# Patient Record
Sex: Female | Born: 1935 | Race: White | Hispanic: No | Marital: Single | Smoking: Former smoker
Health system: Southern US, Community
[De-identification: ages and names within clinical notes are randomized; demographics above are authoritative.]

## PROBLEM LIST (undated history)

## (undated) DIAGNOSIS — J189 Pneumonia, unspecified organism: Secondary | ICD-10-CM

## (undated) DIAGNOSIS — Z923 Personal history of irradiation: Secondary | ICD-10-CM

## (undated) DIAGNOSIS — B029 Zoster without complications: Secondary | ICD-10-CM

## (undated) DIAGNOSIS — K219 Gastro-esophageal reflux disease without esophagitis: Secondary | ICD-10-CM

## (undated) DIAGNOSIS — J449 Chronic obstructive pulmonary disease, unspecified: Secondary | ICD-10-CM

## (undated) DIAGNOSIS — M1990 Unspecified osteoarthritis, unspecified site: Secondary | ICD-10-CM

## (undated) DIAGNOSIS — R0600 Dyspnea, unspecified: Secondary | ICD-10-CM

## (undated) HISTORY — DX: Zoster without complications: B02.9

## (undated) HISTORY — PX: TONSILLECTOMY: SUR1361

## (undated) HISTORY — DX: Personal history of irradiation: Z92.3

## (undated) HISTORY — PX: EYE SURGERY: SHX253

## (undated) HISTORY — PX: DILATION AND CURETTAGE OF UTERUS: SHX78

---

## 2019-02-07 ENCOUNTER — Other Ambulatory Visit: Payer: Self-pay

## 2019-02-07 DIAGNOSIS — Z20822 Contact with and (suspected) exposure to covid-19: Secondary | ICD-10-CM

## 2019-02-08 LAB — NOVEL CORONAVIRUS, NAA: SARS-CoV-2, NAA: NOT DETECTED

## 2019-02-12 ENCOUNTER — Telehealth: Payer: Self-pay | Admitting: General Practice

## 2019-02-12 NOTE — Telephone Encounter (Signed)
Patient informed of negative covid-19 result. Patient verbalized understanding.  °

## 2020-12-01 DIAGNOSIS — C3490 Malignant neoplasm of unspecified part of unspecified bronchus or lung: Secondary | ICD-10-CM

## 2020-12-01 HISTORY — DX: Malignant neoplasm of unspecified part of unspecified bronchus or lung: C34.90

## 2020-12-18 ENCOUNTER — Encounter (HOSPITAL_BASED_OUTPATIENT_CLINIC_OR_DEPARTMENT_OTHER): Payer: Self-pay | Admitting: Emergency Medicine

## 2020-12-18 ENCOUNTER — Inpatient Hospital Stay (HOSPITAL_BASED_OUTPATIENT_CLINIC_OR_DEPARTMENT_OTHER)
Admission: EM | Admit: 2020-12-18 | Discharge: 2020-12-21 | DRG: 189 | Disposition: A | Discharge status: Home / Self Care | Payer: Medicare Other | Attending: Family Medicine | Admitting: Family Medicine

## 2020-12-18 ENCOUNTER — Emergency Department (HOSPITAL_BASED_OUTPATIENT_CLINIC_OR_DEPARTMENT_OTHER): Payer: Medicare Other

## 2020-12-18 ENCOUNTER — Inpatient Hospital Stay (HOSPITAL_COMMUNITY): Payer: Medicare Other

## 2020-12-18 ENCOUNTER — Emergency Department (HOSPITAL_BASED_OUTPATIENT_CLINIC_OR_DEPARTMENT_OTHER): Payer: Medicare Other | Admitting: Radiology

## 2020-12-18 ENCOUNTER — Other Ambulatory Visit: Payer: Self-pay

## 2020-12-18 DIAGNOSIS — Z20822 Contact with and (suspected) exposure to covid-19: Secondary | ICD-10-CM | POA: Diagnosis present

## 2020-12-18 DIAGNOSIS — J90 Pleural effusion, not elsewhere classified: Secondary | ICD-10-CM | POA: Diagnosis present

## 2020-12-18 DIAGNOSIS — J439 Emphysema, unspecified: Secondary | ICD-10-CM

## 2020-12-18 DIAGNOSIS — J9601 Acute respiratory failure with hypoxia: Secondary | ICD-10-CM | POA: Diagnosis not present

## 2020-12-18 DIAGNOSIS — J470 Bronchiectasis with acute lower respiratory infection: Secondary | ICD-10-CM | POA: Diagnosis present

## 2020-12-18 DIAGNOSIS — I248 Other forms of acute ischemic heart disease: Secondary | ICD-10-CM | POA: Diagnosis present

## 2020-12-18 DIAGNOSIS — D7589 Other specified diseases of blood and blood-forming organs: Secondary | ICD-10-CM | POA: Diagnosis present

## 2020-12-18 DIAGNOSIS — F17210 Nicotine dependence, cigarettes, uncomplicated: Secondary | ICD-10-CM | POA: Diagnosis present

## 2020-12-18 DIAGNOSIS — I5032 Chronic diastolic (congestive) heart failure: Secondary | ICD-10-CM | POA: Diagnosis present

## 2020-12-18 DIAGNOSIS — J9811 Atelectasis: Secondary | ICD-10-CM | POA: Diagnosis present

## 2020-12-18 DIAGNOSIS — I700 Atherosclerosis of aorta: Secondary | ICD-10-CM | POA: Diagnosis not present

## 2020-12-18 DIAGNOSIS — J189 Pneumonia, unspecified organism: Secondary | ICD-10-CM | POA: Diagnosis present

## 2020-12-18 DIAGNOSIS — Z880 Allergy status to penicillin: Secondary | ICD-10-CM | POA: Diagnosis not present

## 2020-12-18 DIAGNOSIS — Z720 Tobacco use: Secondary | ICD-10-CM

## 2020-12-18 DIAGNOSIS — R946 Abnormal results of thyroid function studies: Secondary | ICD-10-CM | POA: Diagnosis present

## 2020-12-18 DIAGNOSIS — J9621 Acute and chronic respiratory failure with hypoxia: Secondary | ICD-10-CM | POA: Diagnosis present

## 2020-12-18 DIAGNOSIS — J9622 Acute and chronic respiratory failure with hypercapnia: Secondary | ICD-10-CM | POA: Diagnosis present

## 2020-12-18 DIAGNOSIS — R911 Solitary pulmonary nodule: Secondary | ICD-10-CM | POA: Diagnosis present

## 2020-12-18 DIAGNOSIS — R778 Other specified abnormalities of plasma proteins: Secondary | ICD-10-CM

## 2020-12-18 DIAGNOSIS — D696 Thrombocytopenia, unspecified: Secondary | ICD-10-CM | POA: Diagnosis present

## 2020-12-18 DIAGNOSIS — A419 Sepsis, unspecified organism: Secondary | ICD-10-CM | POA: Insufficient documentation

## 2020-12-18 DIAGNOSIS — R918 Other nonspecific abnormal finding of lung field: Secondary | ICD-10-CM

## 2020-12-18 DIAGNOSIS — R0902 Hypoxemia: Secondary | ICD-10-CM

## 2020-12-18 DIAGNOSIS — J9602 Acute respiratory failure with hypercapnia: Secondary | ICD-10-CM

## 2020-12-18 DIAGNOSIS — R0602 Shortness of breath: Secondary | ICD-10-CM | POA: Diagnosis not present

## 2020-12-18 DIAGNOSIS — D751 Secondary polycythemia: Secondary | ICD-10-CM | POA: Diagnosis not present

## 2020-12-18 DIAGNOSIS — R7989 Other specified abnormal findings of blood chemistry: Secondary | ICD-10-CM | POA: Diagnosis not present

## 2020-12-18 DIAGNOSIS — Z9889 Other specified postprocedural states: Secondary | ICD-10-CM

## 2020-12-18 HISTORY — DX: Sepsis, unspecified organism: A41.9

## 2020-12-18 LAB — BLOOD GAS, VENOUS
Acid-Base Excess: 4.4 mmol/L — ABNORMAL HIGH (ref 0.0–2.0)
Bicarbonate: 37.4 mmol/L — ABNORMAL HIGH (ref 20.0–28.0)
O2 Saturation: 76 %
Patient temperature: 98.6
pCO2, Ven: 88.5 mmHg (ref 44.0–60.0)
pH, Ven: 7.249 — ABNORMAL LOW (ref 7.250–7.430)
pO2, Ven: 49.5 mmHg — ABNORMAL HIGH (ref 32.0–45.0)

## 2020-12-18 LAB — RESP PANEL BY RT-PCR (FLU A&B, COVID) ARPGX2
Influenza A by PCR: NEGATIVE
Influenza B by PCR: NEGATIVE
SARS Coronavirus 2 by RT PCR: NEGATIVE

## 2020-12-18 LAB — TROPONIN I (HIGH SENSITIVITY)
Troponin I (High Sensitivity): 52 ng/L — ABNORMAL HIGH (ref <18)
Troponin I (High Sensitivity): 23 ng/L — ABNORMAL HIGH (ref <18)
Troponin I (High Sensitivity): 47 ng/L — ABNORMAL HIGH (ref <18)

## 2020-12-18 LAB — COMPREHENSIVE METABOLIC PANEL
ALT: 19 U/L (ref 0–44)
AST: 26 U/L (ref 15–41)
Albumin: 3.8 g/dL (ref 3.5–5.0)
Alkaline Phosphatase: 100 U/L (ref 38–126)
Anion gap: 8 (ref 5–15)
BUN: 12 mg/dL (ref 8–23)
CO2: 37 mmol/L — ABNORMAL HIGH (ref 22–32)
Calcium: 9 mg/dL (ref 8.9–10.3)
Chloride: 97 mmol/L — ABNORMAL LOW (ref 98–111)
Creatinine, Ser: 0.7 mg/dL (ref 0.44–1.00)
GFR, Estimated: >60 mL/min (ref >60)
Glucose, Bld: 80 mg/dL (ref 70–99)
Potassium: 4.8 mmol/L (ref 3.5–5.1)
Sodium: 142 mmol/L (ref 135–145)
Total Bilirubin: 1.2 mg/dL (ref 0.3–1.2)
Total Protein: 6.6 g/dL (ref 6.5–8.1)

## 2020-12-18 LAB — CBC WITH DIFFERENTIAL/PLATELET
Abs Immature Granulocytes: 0 10*3/uL (ref 0.00–0.07)
Basophils Absolute: 0 10*3/uL (ref 0.0–0.1)
Basophils Relative: 0 %
Eosinophils Absolute: 0 10*3/uL (ref 0.0–0.5)
Eosinophils Relative: 0 %
HCT: 49.9 % — ABNORMAL HIGH (ref 36.0–46.0)
Hemoglobin: 15.6 g/dL — ABNORMAL HIGH (ref 12.0–15.0)
Lymphocytes Relative: 16 %
Lymphs Abs: 0.6 10*3/uL — ABNORMAL LOW (ref 0.7–4.0)
MCH: 34.2 pg — ABNORMAL HIGH (ref 26.0–34.0)
MCHC: 31.3 g/dL (ref 30.0–36.0)
MCV: 109.4 fL — ABNORMAL HIGH (ref 80.0–100.0)
Monocytes Absolute: 0.3 10*3/uL (ref 0.1–1.0)
Monocytes Relative: 8 %
Neutro Abs: 2.7 10*3/uL (ref 1.7–7.7)
Neutrophils Relative %: 76 %
Platelets: 120 10*3/uL — ABNORMAL LOW (ref 150–400)
RBC: 4.56 MIL/uL (ref 3.87–5.11)
RDW: 16.6 % — ABNORMAL HIGH (ref 11.5–15.5)
Smear Review: DECREASED
WBC: 3.5 10*3/uL — ABNORMAL LOW (ref 4.0–10.5)
nRBC: 0.6 % — ABNORMAL HIGH (ref 0.0–0.2)

## 2020-12-18 LAB — BLOOD GAS, ARTERIAL
Acid-Base Excess: 4.9 mmol/L — ABNORMAL HIGH (ref 0.0–2.0)
Bicarbonate: 37.8 mmol/L — ABNORMAL HIGH (ref 20.0–28.0)
O2 Saturation: 97.5 %
Patient temperature: 99.1
pCO2 arterial: 91.6 mmHg (ref 32.0–48.0)
pH, Arterial: 7.241 — ABNORMAL LOW (ref 7.350–7.450)
pO2, Arterial: 115 mmHg — ABNORMAL HIGH (ref 83.0–108.0)

## 2020-12-18 LAB — EXPECTORATED SPUTUM ASSESSMENT W GRAM STAIN, RFLX TO RESP C

## 2020-12-18 LAB — PROCALCITONIN: Procalcitonin: <0.1 ng/mL

## 2020-12-18 LAB — LACTIC ACID, PLASMA: Lactic Acid, Venous: 1 mmol/L (ref 0.5–1.9)

## 2020-12-18 LAB — MRSA NEXT GEN BY PCR, NASAL: MRSA by PCR Next Gen: NOT DETECTED

## 2020-12-18 MED ORDER — ACETAMINOPHEN 650 MG RE SUPP: 650.0000 mg | Freq: Four times a day (QID) | RECTAL | Status: DC | PRN

## 2020-12-18 MED ORDER — SODIUM CHLORIDE 0.9 % IV SOLN
250.0000 mL | INTRAVENOUS | Status: DC | PRN
  Administered 2020-12-20: 250 mL via INTRAVENOUS

## 2020-12-18 MED ORDER — HYDROCODONE-ACETAMINOPHEN 5-325 MG PO TABS: 1.0000 | ORAL_TABLET | ORAL | Status: DC | PRN

## 2020-12-18 MED ORDER — SODIUM CHLORIDE 0.9 % IV SOLN
500.0000 mg | Freq: Once | INTRAVENOUS | Status: AC
  Administered 2020-12-18: 500 mg via INTRAVENOUS
  Filled 2020-12-18: qty 500

## 2020-12-18 MED ORDER — IPRATROPIUM-ALBUTEROL 0.5-2.5 (3) MG/3ML IN SOLN
3.0000 mL | Freq: Four times a day (QID) | RESPIRATORY_TRACT | Status: DC
  Administered 2020-12-19 – 2020-12-20 (×6): 3 mL via RESPIRATORY_TRACT
  Filled 2020-12-18 (×7): qty 3

## 2020-12-18 MED ORDER — SODIUM CHLORIDE 0.9 % IV SOLN
1.0000 g | INTRAVENOUS | Status: DC
  Administered 2020-12-19 – 2020-12-20 (×2): 1 g via INTRAVENOUS
  Filled 2020-12-18 (×2): qty 10
  Filled 2020-12-18: qty 1

## 2020-12-18 MED ORDER — NICOTINE 7 MG/24HR TD PT24
7.0000 mg | MEDICATED_PATCH | Freq: Every day | TRANSDERMAL | Status: DC
  Filled 2020-12-18 (×2): qty 1

## 2020-12-18 MED ORDER — SODIUM CHLORIDE 0.9 % IV SOLN
75.0000 mL/h | INTRAVENOUS | Status: DC
  Administered 2020-12-18: 75 mL/h via INTRAVENOUS

## 2020-12-18 MED ORDER — SODIUM CHLORIDE 0.9 % IV SOLN: Freq: Once | INTRAVENOUS | Status: AC

## 2020-12-18 MED ORDER — SODIUM CHLORIDE 0.9 % IV SOLN
500.0000 mg | INTRAVENOUS | Status: DC
  Administered 2020-12-19 – 2020-12-20 (×2): 500 mg via INTRAVENOUS
  Filled 2020-12-18 (×3): qty 500

## 2020-12-18 MED ORDER — SODIUM CHLORIDE 0.9 % IV SOLN
1.0000 g | Freq: Once | INTRAVENOUS | Status: AC
  Administered 2020-12-18: 1 g via INTRAVENOUS
  Filled 2020-12-18: qty 10

## 2020-12-18 MED ORDER — SODIUM CHLORIDE 0.9% FLUSH
3.0000 mL | Freq: Two times a day (BID) | INTRAVENOUS | Status: DC
  Administered 2020-12-19 – 2020-12-21 (×5): 3 mL via INTRAVENOUS

## 2020-12-18 MED ORDER — ALBUTEROL SULFATE (2.5 MG/3ML) 0.083% IN NEBU
2.5000 mg | INHALATION_SOLUTION | RESPIRATORY_TRACT | Status: DC | PRN
  Administered 2020-12-18: 2.5 mg via RESPIRATORY_TRACT
  Filled 2020-12-18: qty 3

## 2020-12-18 MED ORDER — SODIUM CHLORIDE 0.9% FLUSH: 3.0000 mL | INTRAVENOUS | Status: DC | PRN

## 2020-12-18 MED ORDER — ACETAMINOPHEN 325 MG PO TABS: 650.0000 mg | ORAL_TABLET | Freq: Four times a day (QID) | ORAL | Status: DC | PRN

## 2020-12-18 MED ORDER — IPRATROPIUM-ALBUTEROL 0.5-2.5 (3) MG/3ML IN SOLN
RESPIRATORY_TRACT | Status: AC
  Administered 2020-12-18: 3 mL via RESPIRATORY_TRACT
  Filled 2020-12-18: qty 3

## 2020-12-18 MED ORDER — NICOTINE 21 MG/24HR TD PT24: 21.0000 mg | MEDICATED_PATCH | Freq: Every day | TRANSDERMAL | Status: DC

## 2020-12-18 MED ORDER — IOHEXOL 350 MG/ML SOLN
75.0000 mL | Freq: Once | INTRAVENOUS | Status: AC | PRN
  Administered 2020-12-18: 75 mL via INTRAVENOUS

## 2020-12-18 NOTE — ED Notes (Signed)
Unable to get a second set of cultures due to hard stick. Several attempts made

## 2020-12-18 NOTE — ED Triage Notes (Signed)
Patient presents to the ER for low O2 saturation. Patient reports she went to the [facility]. Patient was 83% there and drover herself POV here. Patient reports she feels ShOB but denies chest pain. Denies HA, N/V/D

## 2020-12-18 NOTE — H&P (Addendum)
C[place]   Carolan Clines XBM:[id number] DOB: November 18, 1934 DOA: 12/18/2020   PCP: Merryl Hacker, No   Outpatient Specialists:   NONE    Patient arrived to ER on 12/18/20 at 1204 Referred by Attending Toy Baker, MD   Patient coming from: home Lives alone,       Chief Complaint:   Chief Complaint  Patient presents with   Shortness of Breath    HPI: Amy Sandoval is a 86 y.o. female with medical history significant of Tobacco abuse    Presented with  shortness of breath and cough for the past 3 wks She initially attributed that to allergies Reprts dry cough Pt went to UC and was noted to be hypoxic down to 83% on RA  She drove to ER at [facility] where she was noted to hypoxic in 60's on RA and started on O2 CXr was worrisome for Mass pt is a smoker CTA showed no PE but RUL mass and a few nodules, radiology felt fungal infection could not be excluded.   Today tested negative for COVID  Pt still is working for a [employer] She was worried about mold at her place of work  She says years ago she was told she has something on her lung And was treated for it with antibiotics and it got better Denies any fever or chills, no wheezing No CP, no weight loss, no hemoptysis     Has   been vaccinated against COVID and boosted   Initial COVID TEST  NEGATIVE   Lab Results  Component Value Date   [place] 12/18/2020   [place] Not Detected 02/07/2019     Regarding pertinent Chronic problems:  does not go to any doctors  While in ER: CXR - mass CTA no PE, MASS VS FUNGAL BALL     ED Triage Vitals  Enc Vitals Group     BP 12/18/20 1214 (!) 175/105     Pulse Rate 12/18/20 1214 85     Resp 12/18/20 1214 (!) 22     Temp 12/18/20 1218 98 F (36.7 C)     Temp Source 12/18/20 1218 Oral     SpO2 12/18/20 1210 (!) 60 %     Weight --      Height --      Head Circumference --      Peak Flow --      Pain Score 12/18/20 1212 0     Pain Loc --      Pain Edu? --       Excl. in [place]? --   TMAX(24)@     _________________________________________ Significant initial  Findings: Abnormal Labs Reviewed  CBC WITH DIFFERENTIAL/PLATELET - Abnormal; Notable for the following components:      Result Value   WBC 3.5 (*)    Hemoglobin 15.6 (*)    HCT 49.9 (*)    MCV 109.4 (*)    MCH 34.2 (*)    RDW 16.6 (*)    Platelets 120 (*)    nRBC 0.6 (*)    Lymphs Abs 0.6 (*)    All other components within normal limits  COMPREHENSIVE METABOLIC PANEL - Abnormal; Notable for the following components:   Chloride 97 (*)    CO2 37 (*)    All other components within normal limits  TROPONIN I (HIGH SENSITIVITY) - Abnormal; Notable for the following components:   Troponin I (High Sensitivity) 52 (*)    All other components within normal limits  TROPONIN I (HIGH  SENSITIVITY) - Abnormal; Notable for the following components:   Troponin I (High Sensitivity) 23 (*)    All other components within normal limits   ____________________________________________ Ordered   CXR -       CTA chest -   no PE,  evidence of multiple nodules suspicious for mass but fungal disease is also on deferential   _________________________ Troponin 54 -23 ECG: Ordered Personally reviewed by me showing: HR : 79 Rhythm:  NSR, biatrial enlargement and left fascicular block  nonspecific changes,   QTC 429   The recent clinical data is shown below. Vitals:   12/18/20 1645 12/18/20 1730 12/18/20 1733 12/18/20 1745  BP: 125/69 115/79  121/73  Pulse:   80 78  Resp: (!) 25 (!) 21 (!) 25 18  Temp:      TempSrc:      SpO2:   93% 94%    WBC     Component Value Date/Time   WBC 3.5 (L) 12/18/2020 1257   LYMPHSABS 0.6 (L) 12/18/2020 1257   MONOABS 0.3 12/18/2020 1257   EOSABS 0.0 12/18/2020 1257   BASOSABS 0.0 12/18/2020 1257      Lactic Acid, Venous    Component Value Date/Time   LATICACIDVEN 1.0 12/18/2020 1301      Procalcitonin   Ordered   Results for orders placed or  performed during the hospital encounter of 12/18/20  Resp Panel by RT-PCR (Flu A&B, Covid) Nasopharyngeal Swab     Status: None   Collection Time: 12/18/20 12:57 PM   Specimen: Nasopharyngeal Swab; Nasopharyngeal(NP) swabs in vial transport medium  Result Value Ref Range Status   SARS Coronavirus 2 by RT PCR NEGATIVE NEGATIVE Final         Influenza A by PCR NEGATIVE NEGATIVE Final   Influenza B by PCR NEGATIVE NEGATIVE Final           _______________________________________________ Hospitalist was called for admission for lung Mass vs infection  The following Work up has been ordered so far:  Orders Placed This Encounter  Procedures   Critical Care   Culture, blood (routine x 2)   Resp Panel by RT-PCR (Flu A&B, Covid) Nasopharyngeal Swab   DG Chest 2 View   CT Angio Chest PE W/Cm &/Or Wo Cm   CBC with Differential   CBC with Differential/Platelet   Comprehensive metabolic panel   Cardiac monitoring   Utilize spacer/aerochamber with mdi inhaler for COVID-19 positive patients or PUI for COVID-19   If O2 Sat <94% administer O2 at 2 liters/minute via nasal cannula   Cardiac monitoring   Cardiac monitoring   Consult to hospitalist   Pulse oximetry, continuous   Adult wheeze protocol - initiate by RT   ED EKG   EKG 12-Lead   Admit to Inpatient (patient's expected length of stay will be greater than 2 midnights or inpatient only procedure)     Following Medications were ordered in ER: Medications  iohexol (OMNIPAQUE) 350 MG/ML injection 75 mL (75 mLs Intravenous Contrast Given 12/18/20 1422)  cefTRIAXone (ROCEPHIN) 1 g in sodium chloride 0.9 % 100 mL IVPB (0 g Intravenous Stopped 12/18/20 1600)  azithromycin (ZITHROMAX) 500 mg in sodium chloride 0.9 % 250 mL IVPB (0 mg Intravenous Stopped 12/18/20 1731)  0.9 %  sodium chloride infusion ( Intravenous New Bag/Given 12/18/20 1556)        Consult Orders  (From admission, onward)           Start     Ordered  12/18/20 1531   Consult to hospitalist  Spoke with Lurena Joiner [phone]  Once       Provider:  (Not yet assigned)  Question Answer Comment  Place call to: [facility]   Reason for Consult Admit      12/18/20 1530              OTHER Significant initial  Findings:  labs showing:    Recent Labs  Lab 12/18/20 1257  NA 142  K 4.8  CO2 37*  GLUCOSE 80  BUN 12  CREATININE 0.70  CALCIUM 9.0    Cr    stable,    Lab Results  Component Value Date   CREATININE 0.70 12/18/2020    Recent Labs  Lab 12/18/20 1257  AST 26  ALT 19  ALKPHOS 100  BILITOT 1.2  PROT 6.6  ALBUMIN 3.8   Lab Results  Component Value Date   CALCIUM 9.0 12/18/2020        Plt: Lab Results  Component Value Date   PLT 120 (L) 12/18/2020     Venous  Blood Gas ordered       Recent Labs  Lab 12/18/20 1257  WBC 3.5*  NEUTROABS 2.7  HGB 15.6*  HCT 49.9*  MCV 109.4*  PLT 120*    HG/HCT  stable,     Component Value Date/Time   HGB 15.6 (H) 12/18/2020 1257   HCT 49.9 (H) 12/18/2020 1257   MCV 109.4 (H) 12/18/2020 1257         Cultures: No results found for: SDES, SPECREQUEST, CULT, REPTSTATUS   Radiological Exams on Admission: DG Chest 2 View  Result Date: 12/18/2020 CLINICAL DATA:  Shortness of breath. EXAM: CHEST - 2 VIEW COMPARISON:  None. FINDINGS: Lungs are hyperexpanded. Interstitial markings are diffusely coarsened with chronic features. Bibasilar atelectasis or infiltrate with small bilateral pleural effusions noted. 4.3 cm pulmonary mass lesion identified in the right lung apex. The visualized bony structures of the thorax show no acute abnormality. Telemetry leads overlie the chest. IMPRESSION: 1. 4.3 cm mass lesion in the right lung apex. Chest CT with contrast recommended to further evaluate. 2. Hyperexpansion with bibasilar atelectasis or infiltrate and small bilateral pleural effusions. I personally called these results to Dr. Melina Copa in the emergency department at approximately 1345  hours on 12/18/2020. Electronically Signed   By: Misty Stanley M.D.   On: 12/18/2020 13:46   CT Angio Chest PE W/Cm &/Or Wo Cm  Result Date: 12/18/2020 CLINICAL DATA:  Low O2 saturation EXAM: CT ANGIOGRAPHY CHEST WITH CONTRAST TECHNIQUE: Multidetector CT imaging of the chest was performed using the standard protocol during bolus administration of intravenous contrast. Multiplanar CT image reconstructions and MIPs were obtained to evaluate the vascular anatomy. CONTRAST:  68mL OMNIPAQUE IOHEXOL 350 MG/ML SOLN COMPARISON:  None. FINDINGS: Cardiovascular: A CT contrast opacification of the pulmonary arteries. No evidence of pulmonary embolus. Cardiomegaly. No pericardial effusion. Atherosclerotic disease of the thoracic aorta calcifications of the LAD. Mediastinum/Nodes: No pathologically enlarged lymph nodes seen in the chest. Esophagus and thyroid are unremarkable. Lungs/Pleura: Central airways are patent. Mild upper lobe predominant centrilobular emphysema. Bilateral bronchial wall thickening with areas of mucous plugging. Small right-greater-than-left pleural effusions and atelectasis. Masslike nodular opacity of the right upper lobe measuring 3.9 x 2.6 cm additional nodular opacity is seen posteriorly in the right upper lobe measuring 2.8 x 1.9 cm. Additional nodular opacity of the left lower lobe measuring 1.6 x 1.5 cm on series 5, image 158. Upper  Abdomen: No acute abnormality. Musculoskeletal: No chest wall abnormality. No acute or significant osseous findings. Review of the MIP images confirms the above findings. IMPRESSION: No evidence of pulmonary embolus. Right upper lobe masslike opacity. Additional large nodular opacities are seen in the right upper lobe and left lower lobe. Differential includes multifocal infectious process (including fungal etiologies) versus primary lung neoplasm. A 1 month follow CT should be performed following therapy. Small bilateral pleural effusions and atelectasis. Aortic  Atherosclerosis (ICD10-I70.0) and Emphysema (ICD10-J43.9). I personally called these results to Dr. Melina Copa in the emergency department at approximately 0334 hours on 12/18/2020. Electronically Signed   By: Yetta Glassman M.D.   On: 12/18/2020 15:36   _______________________________________________________________________________________________________ Latest  Blood pressure 121/73, pulse 78, temperature 98 F (36.7 C), temperature source Oral, resp. rate 18, SpO2 94 %.   Review of Systems:    Pertinent positives include:   fatigue, shortness of breath at rest.   dyspnea on exertion  Constitutional:  No weight loss, night sweats, Fevers, chills,weight loss  HEENT:  No headaches, Difficulty swallowing,Tooth/dental problems,Sore throat,  No sneezing, itching, ear ache, nasal congestion, post nasal drip,  Cardio-vascular:  No chest pain, Orthopnea, PND, anasarca, dizziness, palpitations.no Bilateral lower extremity swelling  GI:  No heartburn, indigestion, abdominal pain, nausea, vomiting, diarrhea, change in bowel habits, loss of appetite, melena, blood in stool, hematemesis Resp:  no , No excess mucus, no productive cough, No non-productive cough, No coughing up of blood.No change in color of mucus.No wheezing. Skin:  no rash or lesions. No jaundice GU:  no dysuria, change in color of urine, no urgency or frequency. No straining to urinate.  No flank pain.  Musculoskeletal:  No joint pain or no joint swelling. No decreased range of motion. No back pain.  Psych:  No change in mood or affect. No depression or anxiety. No memory loss.  Neuro: no localizing neurological complaints, no tingling, no weakness, no double vision, no gait abnormality, no slurred speech, no confusion  All systems reviewed and apart from [place] all are negative _______________________________________________________________________________________________ Past Medical History:  History reviewed. No pertinent  past medical history.      History reviewed. No pertinent surgical history.  Social History:  Ambulatory   independently       reports that she has been smoking cigarettes. She has a 30.00 pack-year smoking history. She has been exposed to tobacco smoke. She does not have any smokeless tobacco history on file. She reports current alcohol use. She reports that she does not use drugs.    Family History:   Family History  Problem Relation Age of Onset   Hypertension Other    _____ _________________________________________ Allergies: Allergies  Allergen Reactions   Penicillins   Not true allergy according to pt   Prior to Admission medications   Not on File    ___________________________________________________________________________________________________ Physical Exam: Vitals with BMI 12/18/2020 12/18/2020 01/15/7828  Systolic 562 - 130  Diastolic 73 - 79  Pulse 78 80 -   1. General:  in No  Acute distress   Chronically ill  -appearing 2. Psychological: Alert and     Oriented 3. Head/ENT:    Dry Mucous Membranes                          Head Non traumatic, neck supple  Poor Dentition 4. SKIN:  decreased Skin turgor,  Skin clean Dry and intact no rash 5. Heart: Regular rate and rhythm no  Murmur, no Rub or gallop 6. Lungs:  no wheezes distant at the bases 7. Abdomen: Soft,  non-tender, Non distended  bowel sounds present 8. Lower extremities: no clubbing, cyanosis, no  edema 9. Neurologically Grossly intact, moving all 4 extremities equally  10. MSK: Normal range of motion    Chart has been reviewed  ______________________________________________________________________________________________  Assessment/Plan  86 y.o. female with medical history significant of Tobacco abuse   Admitted for lung mass vs infection and pleural effusion resulting in acute respiratory failure   Present on Admission:  Acute respiratory failure with hypoxia  and hypercarbia(HCC) -  this patient has acute respiratory failure with Hypoxia   as documented by the presence of following: O2 saturatio< 90% on RA   Hypercarbia seems chronic pt so far maintaining good mental status Avoid excessive oxygenation Will discuss with pulmonology Who rec obtain ABG and if showing evidence of severe hypercarbia would give a trial of BiPAP and repeat blood gas  Likely due to:   Pneumonia, COPD, pleural effusion Provide O2 therapy and titrate as needed  Continuous pulse ox   check Pulse ox with ambulation prior to discharge   may need  TC consult for home O2 set up  flutter valve ordered    Emphysema lung ([place]) -most likely secondary to tobacco abuse.  Patient denies any wheezing at this time but.  Make sure she is on albuterol.prn as needed   Pleural effusion - after prolonged discussion with pt and family  she is agreeable to undergo thoracentesis  for diagnostic purposes and cytology.   She wants to start with that, can further discuss in AM if she would be interested in seeing pulmonology and or possible biopsy. Will need further discussions. 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  At this point she would like to have more conservative approach she would like to see if we can help her with some "medicines"  after prolonged discussion was agreeable to thoracentesis to start with and see how it goes. Given an infection is in differential we will treat with Rocephin azithromycin for now obtain sputum culture Obtain fungal cultures as well Would benefit from pulmonology consult if patient is  agreeable   Elevated troponin -most likely demand ischemia in the setting of hypoxia. Obtain echo in AM and continue to follow  Obtain echogram given dyspnea and no baseline    Other plan as per orders.  DVT prophylaxis:  SCD       Code Status:    Code Status: Not on file FULL CODE   as per patient  although pt is somewhat ambivalent I had personally discussed CODE STATUS with patient      Family Communication:   Family  at  Bedside  plan of care was discussed  with  Rozanna Boer Daughter,   Disposition Plan:      To home once workup is complete and patient is stable   Following barriers for  discharge:                                                        improving able to transition to PO antibiotics                             Will need to be able to tolerate PO                            Will likely need home health, home O2, set up                    Would benefit from PT/OT eval prior to DC  Ordered                                       Transition of care consulted                  Consults called:   Pulmonology is now aware Will see in AM or sooner if decompensates   Admission status:  ED Disposition     ED Disposition  Admit   Condition  --   [place]: [place] [[id number]]  Level of Care: Telemetry [5]  Admit to tele based on following criteria: Complex arrhythmia (Bradycardia/Tachycardia)  May admit patient to [facility] or [facility] if equivalent level of care is available:: Yes  Interfacility transfer: Yes  Covid Evaluation: Confirmed COVID Negative  Diagnosis: Sepsis [facility]) [[id number]]  Admitting Physician: [place], [place]  Attending Physician: Nita Sells [phone]  Estimated length of stay: 3 - 4 days  Certification:: I certify this patient will need inpatient services for at least 2 midnights            inpatient     I Expect 2 midnight stay secondary to severity of patient's current illness need  for inpatient interventions justified by the following:  hemodynamic instability despite optimal treatment (hypoxia,  )  Severe lab/radiological/exam abnormalities including:    Lung mass and extensive comorbidities including:  substance abuse     That are currently affecting medical management.   I expect  patient to be hospitalized for 2 midnights requiring inpatient medical care.  Patient is at high risk for adverse outcome (such as loss of life or disability) if not treated.  Indication for inpatient stay as follows:    Need for operative/procedural  intervention New or worsening hypoxia  Need for IV antibiotics, IV fluids,      Level of care     tele  For   24H    Lab Results  Component Value Date   [place] NEGATIVE 12/18/2020     Precautions: admitted as   Covid Negative  PPE: Used by the provider:   N95  eye Goggles,  Gloves    Nozomi Mettler 12/18/2020, 9:03 PM    [facility]     after 2 AM please page floor coverage PA If 7AM-7PM, please contact the day team taking care of the patient using [email or web address]   Patient was evaluated in the context of the global COVID-19 pandemic, which  necessitated consideration that the patient might be at risk for infection with the SARS-CoV-2 virus that causes COVID-19. Institutional protocols and algorithms that pertain to the evaluation of patients at risk for COVID-19 are in a state of rapid change based on information released by regulatory bodies including the CDC and federal and state organizations. These policies and algorithms were followed during the patient's care.

## 2020-12-18 NOTE — ED Notes (Signed)
Back from Xray.

## 2020-12-18 NOTE — ED Provider Notes (Signed)
[facility] EMERGENCY DEPT Provider Note   CSN: [id number] Arrival date & time: 12/18/20  1204     History Chief Complaint  Patient presents with   Shortness of Breath    Amy Sandoval is a 86 y.o. female.  She is here with a complaint of shortness of breath been going on about 3 weeks.  She attributes it to the ceiling has been falling down at work.  This is at [place].  She does not know if there was mold or something in the air.  She denies any chest pain or fever.  No significant cough.  Shortness of breath is worse with exertion.  She is a longstanding smoker.  Does not carry a diagnosis of COPD though.  She went to urgent care today where her sats were 83%.  She drove her self here.  When she initially presented her saturations were 60% on room air and she was cyanotic.  At the urgent care she was COVID-negative.  She is vaccinated and boosted x1  The history is provided by the patient.  Shortness of Breath Severity:  Moderate Onset quality:  Gradual Duration:  3 weeks Timing:  Constant Progression:  Unchanged Chronicity:  New Context: occupational exposure   Relieved by:  Nothing Worsened by:  Activity Ineffective treatments:  None tried Associated symptoms: cough   Associated symptoms: no abdominal pain, no chest pain, no fever, no headaches, no hemoptysis, no neck pain, no rash, no sore throat, no sputum production and no vomiting   Risk factors: tobacco use       History reviewed. No pertinent past medical history.  There are no problems to display for this patient.   History reviewed. No pertinent surgical history.   OB History   No obstetric history on file.     History reviewed. No pertinent family history.     Home Medications Prior to Admission medications   Not on File    Allergies    Penicillins  Review of Systems   Review of Systems  Constitutional:  Negative for fever.  HENT:  Negative for sore throat.   Eyes:   Negative for visual disturbance.  Respiratory:  Positive for cough and shortness of breath. Negative for hemoptysis and sputum production.   Cardiovascular:  Negative for chest pain.  Gastrointestinal:  Negative for abdominal pain and vomiting.  Genitourinary:  Negative for dysuria.  Musculoskeletal:  Negative for neck pain.  Skin:  Negative for rash.  Neurological:  Negative for headaches.   Physical Exam Updated Vital Signs BP (!) 175/105 (BP Location: Right Arm)   Pulse 85   Temp 98 F (36.7 C) (Oral)   Resp (!) 22   LMP  (LMP Unknown)   SpO2 96%   Physical Exam Vitals and nursing note reviewed.  Constitutional:      General: She is not in acute distress.    Appearance: She is well-developed.  HENT:     Head: Normocephalic and atraumatic.  Eyes:     Conjunctiva/sclera: Conjunctivae normal.  Cardiovascular:     Rate and Rhythm: Normal rate and regular rhythm.     Heart sounds: No murmur heard. Pulmonary:     Effort: Pulmonary effort is normal. No respiratory distress.     Breath sounds: Normal breath sounds.  Abdominal:     Palpations: Abdomen is soft.     Tenderness: There is no abdominal tenderness.  Musculoskeletal:        General: Normal range of motion.  Cervical back: Neck supple.     Right lower leg: No tenderness. No edema.     Left lower leg: No tenderness. No edema.  Skin:    General: Skin is warm and dry.  Neurological:     General: No focal deficit present.     Mental Status: She is alert.    ED Results / Procedures / Treatments   Labs (all labs ordered are listed, but only abnormal results are displayed) Labs Reviewed  CBC WITH DIFFERENTIAL/PLATELET - Abnormal; Notable for the following components:      Result Value   WBC 3.5 (*)    Hemoglobin 15.6 (*)    HCT 49.9 (*)    MCV 109.4 (*)    MCH 34.2 (*)    RDW 16.6 (*)    Platelets 120 (*)    nRBC 0.6 (*)    Lymphs Abs 0.6 (*)    All other components within normal limits  COMPREHENSIVE  METABOLIC PANEL - Abnormal; Notable for the following components:   Chloride 97 (*)    CO2 37 (*)    All other components within normal limits  TROPONIN I (HIGH SENSITIVITY) - Abnormal; Notable for the following components:   Troponin I (High Sensitivity) 52 (*)    All other components within normal limits  TROPONIN I (HIGH SENSITIVITY) - Abnormal; Notable for the following components:   Troponin I (High Sensitivity) 23 (*)    All other components within normal limits  RESP PANEL BY RT-PCR (FLU A&B, COVID) ARPGX2  CULTURE, BLOOD (ROUTINE X 2)  CULTURE, BLOOD (ROUTINE X 2)  LACTIC ACID, PLASMA  CBC WITH DIFFERENTIAL/PLATELET    Documentation of patient's COVID test today that was negative. EKG EKG Interpretation  Date/Time:  Thursday December 18 2020 12:20:47 EDT Ventricular Rate:  79 PR Interval:  150 QRS Duration: 98 QT Interval:  374 QTC Calculation: 429 R Axis:   240 Text Interpretation: Sinus rhythm Biatrial enlargement Left anterior fascicular block Right ventricular hypertrophy Nonspecific T abnormalities, anterior leads No old tracing to compare Confirmed by Aletta Edouard [phone]) on 12/18/2020 12:26:27 PM  Radiology DG Chest 2 View  Result Date: 12/18/2020 CLINICAL DATA:  Shortness of breath. EXAM: CHEST - 2 VIEW COMPARISON:  None. FINDINGS: Lungs are hyperexpanded. Interstitial markings are diffusely coarsened with chronic features. Bibasilar atelectasis or infiltrate with small bilateral pleural effusions noted. 4.3 cm pulmonary mass lesion identified in the right lung apex. The visualized bony structures of the thorax show no acute abnormality. Telemetry leads overlie the chest. IMPRESSION: 1. 4.3 cm mass lesion in the right lung apex. Chest CT with contrast recommended to further evaluate. 2. Hyperexpansion with bibasilar atelectasis or infiltrate and small bilateral pleural effusions. I personally called these results to Dr. Melina Copa in the emergency department at  approximately 1345 hours on 12/18/2020. Electronically Signed   By: Misty Stanley M.D.   On: 12/18/2020 13:46   CT Angio Chest PE W/Cm &/Or Wo Cm  Result Date: 12/18/2020 CLINICAL DATA:  Low O2 saturation EXAM: CT ANGIOGRAPHY CHEST WITH CONTRAST TECHNIQUE: Multidetector CT imaging of the chest was performed using the standard protocol during bolus administration of intravenous contrast. Multiplanar CT image reconstructions and MIPs were obtained to evaluate the vascular anatomy. CONTRAST:  16mL OMNIPAQUE IOHEXOL 350 MG/ML SOLN COMPARISON:  None. FINDINGS: Cardiovascular: A CT contrast opacification of the pulmonary arteries. No evidence of pulmonary embolus. Cardiomegaly. No pericardial effusion. Atherosclerotic disease of the thoracic aorta calcifications of the LAD. Mediastinum/Nodes: No pathologically enlarged  lymph nodes seen in the chest. Esophagus and thyroid are unremarkable. Lungs/Pleura: Central airways are patent. Mild upper lobe predominant centrilobular emphysema. Bilateral bronchial wall thickening with areas of mucous plugging. Small right-greater-than-left pleural effusions and atelectasis. Masslike nodular opacity of the right upper lobe measuring 3.9 x 2.6 cm additional nodular opacity is seen posteriorly in the right upper lobe measuring 2.8 x 1.9 cm. Additional nodular opacity of the left lower lobe measuring 1.6 x 1.5 cm on series 5, image 158. Upper Abdomen: No acute abnormality. Musculoskeletal: No chest wall abnormality. No acute or significant osseous findings. Review of the MIP images confirms the above findings. IMPRESSION: No evidence of pulmonary embolus. Right upper lobe masslike opacity. Additional large nodular opacities are seen in the right upper lobe and left lower lobe. Differential includes multifocal infectious process (including fungal etiologies) versus primary lung neoplasm. A 1 month follow CT should be performed following therapy. Small bilateral pleural effusions and  atelectasis. Aortic Atherosclerosis (ICD10-I70.0) and Emphysema (ICD10-J43.9). I personally called these results to Dr. Melina Copa in the emergency department at approximately 0334 hours on 12/18/2020. Electronically Signed   By: Yetta Glassman M.D.   On: 12/18/2020 15:36    Procedures .Critical Care  Date/Time: 12/18/2020 5:25 PM Performed by: Hayden Rasmussen, MD Authorized by: Hayden Rasmussen, MD   Critical care provider statement:    Critical care time (minutes):  45   Critical care time was exclusive of:  Separately billable procedures and treating other patients   Critical care was necessary to treat or prevent imminent or life-threatening deterioration of the following conditions:  Respiratory failure   Critical care was time spent personally by me on the following activities:  Discussions with consultants, evaluation of patient's response to treatment, examination of patient, ordering and performing treatments and interventions, ordering and review of laboratory studies, ordering and review of radiographic studies, pulse oximetry, re-evaluation of patient's condition, obtaining history from patient or surrogate, review of old charts and development of treatment plan with patient or surrogate   Medications Ordered in ED Medications  iohexol (OMNIPAQUE) 350 MG/ML injection 75 mL (75 mLs Intravenous Contrast Given 12/18/20 1422)  cefTRIAXone (ROCEPHIN) 1 g in sodium chloride 0.9 % 100 mL IVPB (0 g Intravenous Stopped 12/18/20 1600)  azithromycin (ZITHROMAX) 500 mg in sodium chloride 0.9 % 250 mL IVPB (500 mg Intravenous New Bag/Given 12/18/20 1559)  0.9 %  sodium chloride infusion ( Intravenous New Bag/Given 12/18/20 1556)    ED Course  I have reviewed the triage vital signs and the nursing notes.  Pertinent labs & imaging results that were available during my care of the patient were reviewed by me and considered in my medical decision making (see chart for details).  Clinical Course as  of 12/18/20 1725  Thu Dec 18, 2020  1322 Chest x-ray showing probable right-sided effusion and increased interstitial markings.  Awaiting radiology reading. [MB]  1345 Received a call from radiology.  He is concerned there may be a mass in the right upper lobe and is recommending a chest CT. [MB]  [facility] Radiologist call me about patient's chest CT.  There is no PE.  She has multiple masslike opacities in the right lung.  She is not sure if this is more consistent with neoplasm or infection. [MB]  5885 Discussed with [facility] hospitalist Dr. Verlon Au who accepts the patient for admission.  He asked if we could start some maintenance fluids on her. [MB]    Clinical Course User Index [MB]  Hayden Rasmussen, MD   MDM Rules/Calculators/A&P                          Amy Sandoval was evaluated in Emergency Department on 12/18/2020 for the symptoms described in the history of present illness. She was evaluated in the context of the global COVID-19 pandemic, which necessitated consideration that the patient might be at risk for infection with the SARS-CoV-2 virus that causes COVID-19. Institutional protocols and algorithms that pertain to the evaluation of patients at risk for COVID-19 are in a state of rapid change based on information released by regulatory bodies including the CDC and federal and state organizations. These policies and algorithms were followed during the patient's care in the ED.  This patient complains of shortness of breath for 3 weeks; this involves an extensive number of treatment Options and is a complaint that carries with it a high risk of complications and Morbidity. The differential includes COPD, pneumonia, pneumothorax, PE, vascular, tumor, anemia, COVID  I ordered, reviewed and interpreted labs, which included CBC with mildly depressed white count and platelets, hemoglobin slightly elevated, chemistries with elevated bicarb likely due to some chronic hypercapnia, troponin  elevated but not rising, lactate normal, COVID and flu testing negative.  Blood cultures sent. I ordered medication IV fluids IV antibiotics I ordered imaging studies which included chest x-ray and CT angio chest and I independently    visualized and interpreted imaging which showed multiple opacities in the lungs along with effusion.  Differential includes infectious versus neoplastic Previous records obtained and reviewed in epic, no recent admissions I consulted Dr. Verlon Au [facility] hospitalist and discussed lab and imaging findings  Critical Interventions: Work-up and management of patient's acute hypoxia  After the interventions stated above, I reevaluated the patient and found patient to be resting more comfortably on nasal cannula.  Currently on 4 L.  She will need to be admitted to the hospital for further work-up.   Final Clinical Impression(s) / ED Diagnoses Final diagnoses:  Hypoxia    Rx / DC Orders ED Discharge Orders     None        Hayden Rasmussen, MD 12/18/20 1728

## 2020-12-19 ENCOUNTER — Inpatient Hospital Stay (HOSPITAL_COMMUNITY): Payer: Medicare Other

## 2020-12-19 DIAGNOSIS — J9602 Acute respiratory failure with hypercapnia: Secondary | ICD-10-CM | POA: Diagnosis not present

## 2020-12-19 DIAGNOSIS — J189 Pneumonia, unspecified organism: Secondary | ICD-10-CM

## 2020-12-19 DIAGNOSIS — R778 Other specified abnormalities of plasma proteins: Secondary | ICD-10-CM | POA: Diagnosis not present

## 2020-12-19 DIAGNOSIS — D751 Secondary polycythemia: Secondary | ICD-10-CM

## 2020-12-19 DIAGNOSIS — R0602 Shortness of breath: Secondary | ICD-10-CM

## 2020-12-19 DIAGNOSIS — J9601 Acute respiratory failure with hypoxia: Secondary | ICD-10-CM | POA: Diagnosis not present

## 2020-12-19 DIAGNOSIS — I700 Atherosclerosis of aorta: Secondary | ICD-10-CM

## 2020-12-19 DIAGNOSIS — R7989 Other specified abnormal findings of blood chemistry: Secondary | ICD-10-CM

## 2020-12-19 DIAGNOSIS — D696 Thrombocytopenia, unspecified: Secondary | ICD-10-CM

## 2020-12-19 LAB — ECHOCARDIOGRAM COMPLETE
AR max vel: 1.33 cm2
AV Area VTI: 1.36 cm2
AV Area mean vel: 1.35 cm2
AV Mean grad: 6 mmHg
AV Peak grad: 10.6 mmHg
Ao pk vel: 1.63 m/s
Area-P 1/2: 3.3 cm2
Calc EF: 73 %
Height: 65 in
MV VTI: 1.37 cm2
S' Lateral: 2.2 cm
Single Plane A2C EF: 71.1 %
Single Plane A4C EF: 72.8 %
Weight: 2194.02 oz

## 2020-12-19 LAB — BODY FLUID CELL COUNT WITH DIFFERENTIAL
Eos, Fluid: 2 %
Lymphs, Fluid: 57 %
Monocyte-Macrophage-Serous Fluid: 33 % — ABNORMAL LOW (ref 50–90)
Neutrophil Count, Fluid: 8 % (ref 0–25)
Total Nucleated Cell Count, Fluid: 565 cu mm (ref 0–1000)

## 2020-12-19 LAB — CBC WITH DIFFERENTIAL/PLATELET
Abs Immature Granulocytes: 0.02 10*3/uL (ref 0.00–0.07)
Basophils Absolute: 0 10*3/uL (ref 0.0–0.1)
Basophils Relative: 0 %
Eosinophils Absolute: 0.1 10*3/uL (ref 0.0–0.5)
Eosinophils Relative: 1 %
HCT: 61.9 % — ABNORMAL HIGH (ref 36.0–46.0)
Hemoglobin: 19.3 g/dL — ABNORMAL HIGH (ref 12.0–15.0)
Immature Granulocytes: 0 %
Lymphocytes Relative: 27 %
Lymphs Abs: 1.6 10*3/uL (ref 0.7–4.0)
MCH: 34.6 pg — ABNORMAL HIGH (ref 26.0–34.0)
MCHC: 31.2 g/dL (ref 30.0–36.0)
MCV: 111.1 fL — ABNORMAL HIGH (ref 80.0–100.0)
Monocytes Absolute: 0.7 10*3/uL (ref 0.1–1.0)
Monocytes Relative: 11 %
Neutro Abs: 3.5 10*3/uL (ref 1.7–7.7)
Neutrophils Relative %: 61 %
Platelets: 123 10*3/uL — ABNORMAL LOW (ref 150–400)
RBC: 5.57 MIL/uL — ABNORMAL HIGH (ref 3.87–5.11)
RDW: 17.2 % — ABNORMAL HIGH (ref 11.5–15.5)
WBC: 5.8 10*3/uL (ref 4.0–10.5)
nRBC: 0.7 % — ABNORMAL HIGH (ref 0.0–0.2)

## 2020-12-19 LAB — COMPREHENSIVE METABOLIC PANEL
ALT: 21 U/L (ref 0–44)
AST: 24 U/L (ref 15–41)
Albumin: 3.3 g/dL — ABNORMAL LOW (ref 3.5–5.0)
Alkaline Phosphatase: 82 U/L (ref 38–126)
Anion gap: 9 (ref 5–15)
BUN: 11 mg/dL (ref 8–23)
CO2: 34 mmol/L — ABNORMAL HIGH (ref 22–32)
Calcium: 8.5 mg/dL — ABNORMAL LOW (ref 8.9–10.3)
Chloride: 99 mmol/L (ref 98–111)
Creatinine, Ser: 0.73 mg/dL (ref 0.44–1.00)
GFR, Estimated: >60 mL/min (ref >60)
Glucose, Bld: 123 mg/dL — ABNORMAL HIGH (ref 70–99)
Potassium: 4.7 mmol/L (ref 3.5–5.1)
Sodium: 142 mmol/L (ref 135–145)
Total Bilirubin: 0.7 mg/dL (ref 0.3–1.2)
Total Protein: 5.6 g/dL — ABNORMAL LOW (ref 6.5–8.1)

## 2020-12-19 LAB — BLOOD GAS, ARTERIAL
Acid-Base Excess: 4.9 mmol/L — ABNORMAL HIGH (ref 0.0–2.0)
Acid-Base Excess: 6.6 mmol/L — ABNORMAL HIGH (ref 0.0–2.0)
Bicarbonate: 38.8 mmol/L — ABNORMAL HIGH (ref 20.0–28.0)
Bicarbonate: 36.6 mmol/L — ABNORMAL HIGH (ref 20.0–28.0)
FIO2: 40
FIO2: 30
O2 Saturation: 98.1 %
O2 Saturation: 91.9 %
Patient temperature: 98.1
Patient temperature: 98.6
pCO2 arterial: 97.3 mmHg (ref 32.0–48.0)
pCO2 arterial: 73.5 mmHg (ref 32.0–48.0)
pH, Arterial: 7.223 — ABNORMAL LOW (ref 7.350–7.450)
pH, Arterial: 7.318 — ABNORMAL LOW (ref 7.350–7.450)
pO2, Arterial: 117 mmHg — ABNORMAL HIGH (ref 83.0–108.0)
pO2, Arterial: 66 mmHg — ABNORMAL LOW (ref 83.0–108.0)

## 2020-12-19 LAB — PROTEIN, PLEURAL OR PERITONEAL FLUID: Total protein, fluid: <3 g/dL

## 2020-12-19 LAB — RESPIRATORY PANEL BY PCR

## 2020-12-19 LAB — GLUCOSE, PLEURAL OR PERITONEAL FLUID: Glucose, Fluid: 109 mg/dL

## 2020-12-19 LAB — TSH: TSH: 4.523 u[IU]/mL — ABNORMAL HIGH (ref 0.350–4.500)

## 2020-12-19 LAB — HIV ANTIBODY (ROUTINE TESTING W REFLEX): HIV Screen 4th Generation wRfx: NONREACTIVE

## 2020-12-19 LAB — MAGNESIUM: Magnesium: 1.9 mg/dL (ref 1.7–2.4)

## 2020-12-19 LAB — PROCALCITONIN: Procalcitonin: <0.1 ng/mL

## 2020-12-19 LAB — TROPONIN I (HIGH SENSITIVITY): Troponin I (High Sensitivity): 48 ng/L — ABNORMAL HIGH (ref <18)

## 2020-12-19 LAB — PHOSPHORUS: Phosphorus: 4.8 mg/dL — ABNORMAL HIGH (ref 2.5–4.6)

## 2020-12-19 LAB — BRAIN NATRIURETIC PEPTIDE: B Natriuretic Peptide: 268.8 pg/mL — ABNORMAL HIGH (ref 0.0–100.0)

## 2020-12-19 MED ORDER — LIDOCAINE HCL 1 % IJ SOLN
INTRAMUSCULAR | Status: AC
  Filled 2020-12-19: qty 20

## 2020-12-19 MED ORDER — FUROSEMIDE 10 MG/ML IJ SOLN
20.0000 mg | Freq: Once | INTRAMUSCULAR | Status: AC
  Administered 2020-12-20: 20 mg via INTRAVENOUS
  Filled 2020-12-19: qty 2

## 2020-12-19 MED ORDER — SODIUM CHLORIDE 3 % IN NEBU
4.0000 mL | INHALATION_SOLUTION | Freq: Two times a day (BID) | RESPIRATORY_TRACT | Status: DC
  Administered 2020-12-20 – 2020-12-21 (×2): 4 mL via RESPIRATORY_TRACT
  Filled 2020-12-19 (×4): qty 4

## 2020-12-19 NOTE — Assessment & Plan Note (Addendum)
--   Continue empiric antibiotics. Continue management per pulmonology.  Follow-up CT as outpatient with pulmonology.

## 2020-12-19 NOTE — Assessment & Plan Note (Signed)
--   Follow-up with pulmonology as an outpatient.  Appears stable at this time.

## 2020-12-19 NOTE — Progress Notes (Signed)
Since patient was admitted to the 4th floor, nurse kept communication with the on call provider, respiratory therapist, charge nurse and rapid response nurse about patient's condition. Patient was able to get up to the bedside commode with one person assist with BiPap still on to use the bathroom. Urine specimen was not able to be collected during the night shift. Nurse passed it along to day shift nurse that it still needed to be collected.

## 2020-12-19 NOTE — Progress Notes (Signed)
   12/19/20 0138  Oxygen Therapy  SpO2 91 %  O2 Device Bi-PAP  Glasgow Coma Scale  Eye Opening 4  Best Verbal Response (NON-intubated) 5  Best Motor Response 6  Glasgow Coma Scale Score 15  Provider Notification  Provider Name/Title J. Olena Heckle (on call provider) & A. Doutova (admitting attending)  Date Provider Notified 12/19/20  Time Provider Notified 0117  Notification Type Page  Notification Reason Critical result  Test performed and critical result pCO2 97.3  Date Critical Result Received 12/19/20  Time Critical Result Received 0112  Provider response See new orders  Date of Provider Response 12/19/20  Time of Provider Response 0123  Rapid Response Notification  Name of Rapid Response RN Notified Erin, RN  Date Rapid Response Notified 12/19/20  Time Rapid Response Notified 8921 (Came to patient's room)

## 2020-12-19 NOTE — Assessment & Plan Note (Signed)
--   Mild.  Follow clinically.

## 2020-12-19 NOTE — Assessment & Plan Note (Addendum)
--   Treating as pneumonia at this point.  Fungal infection thought to be unlikely.  Probably has chronic hypoxic and hypercapnic respiratory failure as suggested by polycythemia, asymptomatic state despite profound hypoxia, relatively compensated hypercapnia. -- Will need home oxygen.

## 2020-12-19 NOTE — Consult Note (Signed)
NAME:  Amy Sandoval, MRN:  [id number], DOB:  1934-07-29, LOS: 1 ADMISSION DATE:  12/18/2020, CONSULTATION DATE:  8/19 REFERRING MD:  Dr. Sarajane Jews, CHIEF COMPLAINT:  SOB   History of Present Illness:  86 y/o F who presented to the [facility] ER on 8/18 with reports of shortness of breath.    At baseline, she lives at home alone.  She was seen in a [facility] clinic 8/18 for SOB, nasal congestion and saturations were found to be 83%.  COVID testing was negative. She refused EMS and drove herself to the ER at [facility] for evaluation. On arrival there, she was 60% on room air and cyanotic.  She reported a 3 week hx of increasing shortness of breath, dry cough.  The patient felt it might be relate to the ceiling falling down at her work (a [employer]).  She has been vaccinated and boosted for COVID. CXR on admission showed a 4.3 cm mass like lesion in the right apex, hyperexpansion with bibasilar atelectasis vs infiltrate and small bilateral pleural effusions.  Follow up CTA of the chest was completed which was negative for PE, but showed RUL masslike opacity, additional large nodular opacities in the RUL, LLL & small bilateral pleural effusions.  She was admitted per [facility] for further evaluation. ABG showed 7.241 / 91.6 / 115 / bicarbonate 37.8.  PCT negative.  Baseline CO2 on BMP 37 suggestive of chronic hypercarbia. Hgb 15.6, macrocytosis.  She was placed on BiPAP overnight. Currently requiring 3L [place]. She underwent thoracentesis on 8/19 per IR with 450 ml of clear yellow fluid removed.   PCCM consulted 8/19 for pulmonary evaluation.   Pertinent  Medical History  Tobacco Abuse   Significant Hospital Events: Including procedures, antibiotic start and stop dates in addition to other pertinent events   8/18 Admit with SOB, CT notable for RUL mass like opacity, RUL/LLL nodular opacities  Interim History / Subjective:  Denies complaints   Objective   Blood pressure 116/68, pulse 72, temperature 97.9 F  (36.6 C), temperature source Oral, resp. rate 20, height 5\' 5"  (1.651 m), weight 62.2 kg, SpO2 91 %.    FiO2 (%):  [28 %-30 %] 30 %   Intake/Output Summary (Last 24 hours) at 12/19/2020 1744 Last data filed at 12/19/2020 1637 Gross per 24 hour  Intake 444.88 ml  Output --  Net 444.88 ml   Filed Weights   12/19/20 0000  Weight: 62.2 kg    Examination: General: elderly female lying in bed in NAD  HENT: anicteric, [place] O2 Lungs: non-labored at rest, lungs bilaterally diminished Cardiovascular: s1s2 RRR, no m/r/g Abdomen: soft, non-tender, BSx4 active  Extremities: warm/dry, no edema Neuro: AAOx4, speech clear, MAE    Resolved Hospital Problem list     Assessment & Plan:   RUL Masslike Opacity RUL/LLL Nodular Opacities  Acute on Chronic Hypoxemic Respiratory Failure Tobacco Abuse  Suspect chronic, likely undiagnosed hypoxia given lab findings, ABG.  Prior hx of being told she had "something on her lung" and was treated with antibiotics and states "it got better".  COVID, flu, RVP negative.  Doubt fungal infection.  Nodular appearance worrisome for malignancy but differential includes sarcoid, granulomatous disease, mucoid impaction given airway thickening, endemic mycoses but she is not immunocompromised, & vasculitis  -empiric abx as ordered -consider lasix 8/20 -assess serum galactomannan, blasto/histo antigen, sed rate  -send UA -3% NS + flutter  -will need assessment of ambulatory O2 needs prior to discharge  -follow intermittent CXR  -  will need pulmonary follow up in 1 month with CT imaging to review  Best Practice (right click and "Reselect all SmartList Selections" daily)  Per Primary   Labs   CBC: Recent Labs  Lab 12/18/20 1257 12/19/20 0017  WBC 3.5* 5.8  NEUTROABS 2.7 3.5  HGB 15.6* 19.3*  HCT 49.9* 61.9*  MCV 109.4* 111.1*  PLT 120* 123*    Basic Metabolic Panel: Recent Labs  Lab 12/18/20 1257 12/19/20 0017  NA 142 142  K 4.8 4.7  CL 97* 99   CO2 37* 34*  GLUCOSE 80 123*  BUN 12 11  CREATININE 0.70 0.73  CALCIUM 9.0 8.5*  MG  --  1.9  PHOS  --  4.8*   GFR: Estimated Creatinine Clearance: 47.1 mL/min (by C-G formula based on SCr of 0.73 mg/dL). Recent Labs  Lab 12/18/20 1257 12/18/20 1301 12/18/20 2116 12/19/20 0017  PROCALCITON  --   --  <0.10 <0.10  WBC 3.5*  --   --  5.8  LATICACIDVEN  --  1.0  --   --     Liver Function Tests: Recent Labs  Lab 12/18/20 1257 12/19/20 0017  AST 26 24  ALT 19 21  ALKPHOS 100 82  BILITOT 1.2 0.7  PROT 6.6 5.6*  ALBUMIN 3.8 3.3*   No results for input(s): LIPASE, AMYLASE in the last 168 hours. No results for input(s): AMMONIA in the last 168 hours.  ABG    Component Value Date/Time   PHART 7.318 (L) 12/19/2020 0312   PCO2ART 73.5 (HH) 12/19/2020 0312   PO2ART 66.0 (L) 12/19/2020 0312   HCO3 36.6 (H) 12/19/2020 0312   O2SAT 91.9 12/19/2020 0312     Coagulation Profile: No results for input(s): INR, PROTIME in the last 168 hours.  Cardiac Enzymes: No results for input(s): CKTOTAL, CKMB, CKMBINDEX, TROPONINI in the last 168 hours.  HbA1C: No results found for: HGBA1C  CBG: No results for input(s): GLUCAP in the last 168 hours.  Review of Systems: Positives in [place]   Gen: Denies fever, chills, weight change, fatigue, night sweats HEENT: Denies blurred vision, double vision, hearing loss, tinnitus, sinus congestion, rhinorrhea, sore throat, neck stiffness, dysphagia PULM: Denies shortness of breath, cough, sputum production, hemoptysis, wheezing CV: Denies chest pain, edema, orthopnea, paroxysmal nocturnal dyspnea, palpitations GI: Denies abdominal pain, nausea, vomiting, diarrhea, hematochezia, melena, constipation, change in bowel habits GU: Denies dysuria, hematuria, polyuria, oliguria, urethral discharge Endocrine: Denies hot or cold intolerance, polyuria, polyphagia or appetite change Derm: Denies rash, dry skin, scaling or peeling skin change Heme:  Denies easy bruising, bleeding, bleeding gums Neuro: Denies headache, numbness, weakness, slurred speech, loss of memory or consciousness  Past Medical History:  She,  has no past medical history on file.   Surgical History:  History reviewed. No pertinent surgical history.   Social History:   reports that she has been smoking cigarettes. She has a 30.00 pack-year smoking history. She has been exposed to tobacco smoke. She does not have any smokeless tobacco history on file. She reports current alcohol use. She reports that she does not use drugs.   Family History:  Her family history includes Hypertension in an other family member.   Allergies Allergies  Allergen Reactions   Penicillins      Home Medications  Prior to Admission medications   Not on File     Critical care time: n/a    Noe Gens, MSN, APRN, NP-C, AGACNP-BC [facility] 12/19/2020, 6:33 PM  Please see [email or web address] for pager details.   From 7A-7P if no response, please call [phone] After hours, please call ELink [phone]

## 2020-12-19 NOTE — Progress Notes (Signed)
*  PRELIMINARY RESULTS* Echocardiogram 2D Echocardiogram has been performed.  Luisa Hart RDCS 12/19/2020, 8:52 AM

## 2020-12-19 NOTE — Progress Notes (Signed)
OT Cancellation Note  Patient Details Name: Amy Sandoval MRN: [id number] DOB: 1935/04/15   Cancelled Treatment:    Reason Eval/Treat Not Completed: OT screened, no needs identified, will sign off. Patient independent with mobility and ADLs. No OT needs at this time.  Rayden Dock L Wm Fruchter 12/19/2020, 1:36 PM

## 2020-12-19 NOTE — Assessment & Plan Note (Signed)
--  mild, recheck as outpatient

## 2020-12-19 NOTE — Progress Notes (Signed)
SATURATION QUALIFICATIONS: (This note is used to comply with regulatory documentation for home oxygen)  Patient Saturations on Room Air at Rest = 85%    Patient Saturations on 3 Liters of oxygen while Ambulating = 88%  Please briefly explain why patient needs home oxygen:to maintain appropriate SaO2 levels.   Blondell Reveal Kistler PT 12/19/2020  Acute Rehabilitation Services Pager [phone] Office [phone]

## 2020-12-19 NOTE — Progress Notes (Signed)
   12/19/20 0330  Provider Notification  Provider Name/Title J. Olena Heckle (on call provider)  Date Provider Notified 12/19/20  Time Provider Notified 0330  Notification Type Page  Notification Reason Critical result  Test performed and critical result pCO2 73.5  Date Critical Result Received 12/19/20  Time Critical Result Received 0325  Provider response See new orders  Date of Provider Response 12/19/20  Time of Provider Response 0342  Rapid Response Notification  Name of Rapid Response RN Notified Erin, RN  Date Rapid Response Notified 12/19/20  Time Rapid Response Notified 0356

## 2020-12-19 NOTE — Procedures (Signed)
PROCEDURE SUMMARY:  Successful US guided right diagnostic and therapeutic thoracentesis. Yielded 450 mL of yellow, clear fluid. Pt tolerated procedure well. No immediate complications.  Specimen was sent for labs. CXR ordered.  EBL < 5 mL  Docia Barrier PA-C 12/19/2020 3:32 PM

## 2020-12-19 NOTE — Hospital Course (Addendum)
86 year old woman longtime smoker, undiagnosed emphysema, presented with shortness of breath, found to be hypoxic, admitted for further evaluation of lung abnormalities.  RUL Masslike Opacity RUL/LLL Nodular Opacities  Acute on Chronic Hypoxemic Respiratory Failure Tobacco Abuse  Suspect chronic, likely undiagnosed hypoxia given lab findings, ABG.  Prior hx of being told she had "something on her lung" and was treated with antibiotics and states "it got better".  COVID, flu, RVP, procalcitonin negative.  She appears to have chronic bronchial thickening and mild bronchiectasis on CT.  Marland Kitchen  Nodular appearance worrisome for malignancy but differential includes sarcoid, granulomatous disease, mucoid impaction given airway thickening, endemic mycoses, and vasculitis.  HIV negative.  No neutropenia.  No identifiable risk factor for fungal pneumonia.  BNP elevated. Effusion appears transudate (not all studies for Lights sent). Effusions likely related to volume overload, LE edema present on exam. -empiric abx as ordered -f/u  sputum culture -lasix 8/20 -assess serum galactomannan, blasto/histo antigen, sed rate  -send UA -3% NS + flutter  -will need assessment of ambulatory O2 needs prior to discharge  -will need pulmonary follow up in 1 month with CT imaging to review, will arrange

## 2020-12-19 NOTE — Assessment & Plan Note (Signed)
Consider statin as outpatient if indicated

## 2020-12-19 NOTE — Progress Notes (Signed)
Pt had VBG drawn per order, with critical result CO2 88.5. Reported this result to on call NP Olena Heckle, and admitting MD [facility] was following pt as well. MD spoke with pt regarding willingness to try BiPAP, to which pt was agreeable. Order placed to transfer pt to progressive care and start BiPAP. Report called to [place] on [address] [place] and pt transferred to room 1434. RT in room on admit to initiate BiPAP. Hortencia Conradi RN

## 2020-12-19 NOTE — Progress Notes (Signed)
PROGRESS NOTE  Amy Sandoval WFU:[id number] DOB: 1935/04/28 DOA: 12/18/2020 PCP: Pcp, No  Brief History   86 year old woman longtime smoker, undiagnosed emphysema, presented with shortness of breath, found to be hypoxic, admitted for further evaluation of lung abnormalities.  A & P  * Acute respiratory failure with hypoxia and hypercapnia (HCC) -- Presumably secondary to lung abnormalities, infection versus tumor, complicated by underlying emphysema long-term smoking. --On 3 L at this time.  Wean oxygen as tolerated.  Still short of breath.  Lung sounds clear, emphysema appears stable.  Multifocal pneumonia -- Empiric antibiotics.  Etiology unclear.  Procalcitonin low.  We will ask pulmonology to evaluate.  Differential includes fungal and malignancy.  Given clinical stability, will hold off on antifungals presently.  Pleural effusion --attempt thoracentesis for analysis  Thrombocytopenia (HCC) -- Mild.  Follow clinically.  Emphysema lung ([place]) -- Follow-up with pulmonology as an outpatient.  Appears stable at this time.  Aortic atherosclerosis ([place]) Consider statin as outpatient if indicated  Elevated TSH --mild, recheck as outpatient  Polycythemia -- Secondary to smoking.  No treatment indicated.   Disposition Plan:  Discussion:   Status is: Inpatient  Remains inpatient appropriate because:IV treatments appropriate due to intensity of illness or inability to take PO and Inpatient level of care appropriate due to severity of illness  Dispo: The patient is from: Home              Anticipated d/c is to: Home              Patient currently is not medically stable to d/c.   Difficult to place patient No  DVT prophylaxis: SCDs Start: 12/18/20 1928   Code Status: Full Code Level of care: Progressive Family Communication: none  Murray Hodgkins, MD  [facility] Direct contact: see [email or web address] (further directions at bottom of note if needed) 7PM-7AM contact night  coverage as at bottom of note 12/19/2020, 3:21 PM  LOS: 1 day   Significant Hospital Events      Consults:  Pulmonology    Procedures:    Significant Diagnostic Tests:     Micro Data:     Antimicrobials:    Interval History/Subjective  CC: f/u SOB  Feels okay but still short of breath. Smoker.  Objective   Vitals:  Vitals:   12/19/20 1220 12/19/20 1241  BP:  (!) 120/96  Pulse:  75  Resp:  20  Temp:  97.9 F (36.6 C)  SpO2: (!) 88% 90%    Exam: Physical Exam Vitals reviewed.  Constitutional:      General: She is not in acute distress.    Appearance: She is not ill-appearing or toxic-appearing.  Cardiovascular:     Rate and Rhythm: Regular rhythm. Tachycardia present.     Heart sounds: No murmur heard. Pulmonary:     Breath sounds: No wheezing, rhonchi or rales.     Comments: Mild increased resp effort and pursed lip breathing, able to speak in full sentences. Decreased breath sounds Neurological:     Mental Status: She is alert.  Psychiatric:        Mood and Affect: Mood normal.        Behavior: Behavior normal.    I have personally reviewed the labs and other data, making special note of:   Today's Data  ABG noted 7.3 1/73/66 CMP Procalcitonin low Polycythemia noted    Scheduled Meds:  ipratropium-albuterol  3 mL Nebulization Q6H   lidocaine       nicotine  7  mg Transdermal Daily   sodium chloride flush  3 mL Intravenous Q12H   Continuous Infusions:  sodium chloride     azithromycin     cefTRIAXone (ROCEPHIN)  IV      Principal Problem:   Acute respiratory failure with hypoxia and hypercapnia (HCC) Active Problems:   Multifocal pneumonia   Pleural effusion   Emphysema lung (HCC)   Thrombocytopenia (HCC)   Tobacco abuse   Lung mass   Elevated troponin   Polycythemia   Elevated TSH   Aortic atherosclerosis ([place])   LOS: 1 day   How to contact the [facility] Attending or Consulting provider 7A - 7P or covering provider during after  hours [place], for this patient?  Check the care team in [facility] and look for a) attending/consulting TRH provider listed and b) the [facility] team listed Log into [email or web address] and use [place]'s universal password to access. If you do not have the password, please contact the hospital operator. Locate the [facility] provider you are looking for under [facility] and page to a number that you can be directly reached. If you still have difficulty reaching the provider, please page the [facility] (Director on Call) for the Hospitalists listed on amion for assistance.

## 2020-12-19 NOTE — Evaluation (Signed)
Physical Therapy Evaluation Patient Details Name: Shakiya Mcneary MRN: [id number] DOB: 1934/05/18 Today's Date: 12/19/2020   History of Present Illness  86 y.o. admitted with SOB. Dx of acute respiratory failure, new lung mass noted on imaging.  Clinical Impression  Pt ambulated 220' without an assistive device, without loss of balance. SaO2 88% on 3L O2 while walking, 3/4 dyspnea. SaO2 85% on RA at rest. No further PT indicated as pt is independent with mobility, will sign off.     Follow Up Recommendations No PT follow up    Equipment Recommendations  Other (comment) (home O2)    Recommendations for Other Services       Precautions / Restrictions Precautions Precautions: Other (comment) Precaution Comments: monitor O2 Restrictions Weight Bearing Restrictions: No      Mobility  Bed Mobility Overal bed mobility: Independent                  Transfers Overall transfer level: Independent                  Ambulation/Gait Ambulation/Gait assistance: Independent Gait Distance (Feet): 220 Feet Assistive device: None Gait Pattern/deviations: WFL(Within Functional Limits) Gait velocity: WNL   General Gait Details: steady, no loss of balance, SaO2 88% on 3L O2 [place], 3/4 dyspnea  Stairs            Wheelchair Mobility    Modified Rankin (Stroke Patients Only)       Balance Overall balance assessment: Independent                                           Pertinent Vitals/Pain Pain Assessment: No/denies pain    Home Living Family/patient expects to be discharged to:: Private residence Living Arrangements: Alone   Type of Home: House Home Access: Stairs to enter Entrance Stairs-Rails: None Entrance Stairs-Number of Steps: 7 Home Layout: Two level;Able to live on main level with bedroom/bathroom Home Equipment: None Additional Comments: denies falls in the past 1 year, works 7 days/week, 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[employer], fashion/clothing    Prior Function  Level of Independence: Independent               Journalist, newspaper        Extremity/Trunk Assessment   Upper Extremity Assessment Upper Extremity Assessment: Overall WFL for tasks assessed    Lower Extremity Assessment Lower Extremity Assessment: Overall WFL for tasks assessed    Cervical / Trunk Assessment Cervical / Trunk Assessment: Normal  Communication   Communication: No difficulties  Cognition Arousal/Alertness: Awake/alert Behavior During Therapy: WFL for tasks assessed/performed Overall Cognitive Status: Within Functional Limits for tasks assessed                                        General Comments      Exercises     Assessment/Plan    PT Assessment Patent does not need any further PT services  PT Problem List         PT Treatment Interventions      PT Goals (Current goals can be found in the Care Plan section)  Acute Rehab PT Goals Patient Stated Goal: 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 PT Goal Formulation: All assessment and education complete, DC therapy    Frequency  Barriers to discharge        Co-evaluation               AM-PAC PT "6 Clicks" Mobility  Outcome Measure Help needed turning from your back to your side while in a flat bed without using bedrails?: None Help needed moving from lying on your back to sitting on the side of a flat bed without using bedrails?: None Help needed moving to and from a bed to a chair (including a wheelchair)?: None Help needed standing up from a chair using your arms (e.g., wheelchair or bedside chair)?: None Help needed to walk in hospital room?: None Help needed climbing 3-5 steps with a railing? : None 6 Click Score: 24    End of Session Equipment Utilized During Treatment: Gait belt;Oxygen Activity Tolerance: Patient tolerated treatment well Patient left: in bed Nurse Communication: Mobility status      Time: 1464-3142 PT Time Calculation  (min) (ACUTE ONLY): 19 min   Charges:   PT Evaluation $PT Eval Low Complexity: 1 Low         Blondell Reveal Kistler PT 12/19/2020  Acute Rehabilitation Services Pager [phone] Office [phone]

## 2020-12-19 NOTE — Plan of Care (Signed)
  Problem: Pain Managment: Goal: General experience of comfort will improve Outcome: Progressing   Problem: Safety: Goal: Ability to remain free from injury will improve Outcome: Progressing   Problem: Skin Integrity: Goal: Risk for impaired skin integrity will decrease Outcome: Progressing   

## 2020-12-19 NOTE — Progress Notes (Signed)
Pt requesting to come off BIPAP at this time.  RT placed pt on 3 LPM [place].  Pt tolerating well at this time.

## 2020-12-19 NOTE — Assessment & Plan Note (Signed)
--   Secondary to smoking.  No treatment indicated.

## 2020-12-19 NOTE — Assessment & Plan Note (Addendum)
--   Status postthoracentesis 8/19.  Culture pending.

## 2020-12-20 DIAGNOSIS — J9601 Acute respiratory failure with hypoxia: Secondary | ICD-10-CM | POA: Diagnosis not present

## 2020-12-20 DIAGNOSIS — J9602 Acute respiratory failure with hypercapnia: Secondary | ICD-10-CM | POA: Diagnosis not present

## 2020-12-20 DIAGNOSIS — I5032 Chronic diastolic (congestive) heart failure: Secondary | ICD-10-CM

## 2020-12-20 DIAGNOSIS — J189 Pneumonia, unspecified organism: Secondary | ICD-10-CM

## 2020-12-20 LAB — URINALYSIS, ROUTINE W REFLEX MICROSCOPIC
Bilirubin Urine: NEGATIVE
Glucose, UA: NEGATIVE mg/dL
Ketones, ur: NEGATIVE mg/dL
Leukocytes,Ua: NEGATIVE
Nitrite: NEGATIVE
Protein, ur: NEGATIVE mg/dL
Specific Gravity, Urine: 1.026 (ref 1.005–1.030)
pH: 5 (ref 5.0–8.0)

## 2020-12-20 LAB — SEDIMENTATION RATE: Sed Rate: 0 mm/hr (ref 0–22)

## 2020-12-20 LAB — STREP PNEUMONIAE URINARY ANTIGEN: Strep Pneumo Urinary Antigen: NEGATIVE

## 2020-12-20 LAB — PROCALCITONIN: Procalcitonin: <0.1 ng/mL

## 2020-12-20 MED ORDER — FUROSEMIDE 20 MG PO TABS
20.0000 mg | ORAL_TABLET | Freq: Every day | ORAL | Status: DC
  Administered 2020-12-20: 20 mg via ORAL
  Filled 2020-12-20: qty 1

## 2020-12-20 MED ORDER — IPRATROPIUM-ALBUTEROL 0.5-2.5 (3) MG/3ML IN SOLN
3.0000 mL | Freq: Three times a day (TID) | RESPIRATORY_TRACT | Status: DC
  Administered 2020-12-21: 3 mL via RESPIRATORY_TRACT
  Filled 2020-12-20: qty 3

## 2020-12-20 NOTE — Assessment & Plan Note (Signed)
--  flat, trivial, no s/s of ACS, no further evaluation

## 2020-12-20 NOTE — Progress Notes (Signed)
SATURATION QUALIFICATIONS: (This note is used to comply with regulatory documentation for home oxygen)  Patient Saturations on Room Air at Rest = 87%  Patient Saturations on Room Air while Ambulating = 66%  Patient Saturations on 4 Liters of oxygen while Ambulating = 92%  Please briefly explain why patient needs home oxygen: O2 drops to 66% while ambulating on RA, increases to 90's when 4L O2 applied via [place]

## 2020-12-20 NOTE — Progress Notes (Signed)
Patients O2 sat dropped to 66% after ambulating to BR on RA.  Placed back on 4L once back to bed, sat increased gradually to 92%. Patient denies SOB, steady with ambulation

## 2020-12-20 NOTE — Plan of Care (Signed)

## 2020-12-20 NOTE — Progress Notes (Signed)
PROGRESS NOTE  Amy Sandoval ALP:[id number] DOB: [birth year] DOA: 12/18/2020 PCP: Pcp, No  Brief History   86 year old woman longtime smoker, undiagnosed emphysema, presented with shortness of breath, found to be hypoxic, admitted for further evaluation of lung abnormalities.   A & P  * Acute respiratory failure with hypoxia and hypercapnia (HCC) -- Treating as pneumonia at this point.  Fungal infection thought to be unlikely.  Probably has chronic hypoxic and hypercapnic respiratory failure as suggested by polycythemia, asymptomatic state despite profound hypoxia, relatively compensated hypercapnia. -- Will need home oxygen.  Multifocal pneumonia -- Continue empiric antibiotics. Continue management per pulmonology.  Follow-up CT as outpatient with pulmonology.  Pleural effusion -- Status postthoracentesis 8/19.  Culture pending.  Thrombocytopenia (HCC) -- Mild.  Follow clinically.  Emphysema lung ([place]) -- Follow-up with pulmonology as an outpatient.  Appears stable at this time.  Chronic diastolic CHF (congestive heart failure) (HCC) --stable  Aortic atherosclerosis (HCC) Consider statin as outpatient if indicated  Elevated TSH --mild, recheck as outpatient  Polycythemia -- Secondary to smoking.  No treatment indicated.  Elevated troponin --flat, trivial, no s/s of ACS, no further evaluation   Disposition Plan:  Discussion:   Status is: Inpatient  Remains inpatient appropriate because:IV treatments appropriate due to intensity of illness or inability to take PO and Inpatient level of care appropriate due to severity of illness  Dispo: The patient is from: Home              Anticipated d/c is to: Home              Patient currently is not medically stable to d/c.   Difficult to place patient No  DVT prophylaxis: SCDs Start: 12/18/20 1928   Code Status: Full Code Level of care: Med-Surg Family Communication: none  Murray Hodgkins, MD  [facility] Direct contact: see [email or web address] (further directions at bottom of note if needed) 7PM-7AM contact night coverage as at bottom of note 12/20/2020, 3:46 PM  LOS: 2 days     Consults:  Pulmonology     Micro Data:  Pleural fluid culture pending Respiratory culture pending BC NGTD   Antimicrobials:  Azithromycin 8/18 > Ceftriaxone 8/18 >  Interval History/Subjective  CC: f/u SOB  Feels ok today, breathing better.  Objective   Vitals:  Vitals:   12/20/20 1257 12/20/20 1324  BP: 126/80   Pulse: 72   Resp: 18   Temp: 98.5 F (36.9 C)   SpO2: 92% 92%    Exam: Physical Exam Vitals reviewed.  Constitutional:      General: She is not in acute distress.    Appearance: She is not ill-appearing or toxic-appearing.  Cardiovascular:     Rate and Rhythm: Normal rate and regular rhythm.     Heart sounds: No murmur heard. Pulmonary:     Effort: Pulmonary effort is normal.     Breath sounds: Wheezing present. No rhonchi or rales.     Comments: On 4L [place] Neurological:     Mental Status: She is alert.  Psychiatric:        Mood and Affect: Mood normal.        Behavior: Behavior normal.    I have personally reviewed the labs and other data, making special note of:   Today's Data  Procalcitonin negative Strep pneum urinary antigen negative  Scheduled Meds:  furosemide  20 mg Oral Daily   ipratropium-albuterol  3 mL Nebulization Q6H   nicotine  7 mg Transdermal Daily  sodium chloride flush  3 mL Intravenous Q12H   sodium chloride HYPERTONIC  4 mL Nebulization BID   Continuous Infusions:  sodium chloride     azithromycin 250 mL/hr at 12/19/20 1637   cefTRIAXone (ROCEPHIN)  IV Stopped (12/19/20 1631)    Principal Problem:   Acute respiratory failure with hypoxia and hypercapnia (HCC) Active Problems:   Multifocal pneumonia   Pleural effusion   Emphysema lung (HCC)   Thrombocytopenia (HCC)   Tobacco abuse   Lung mass   Elevated troponin   Polycythemia    Elevated TSH   Aortic atherosclerosis (HCC)   Chronic diastolic CHF (congestive heart failure) ([place])   LOS: 2 days   How to contact the [facility] Attending or Consulting provider 7A - 7P or covering provider during after hours [place], for this patient?  Check the care team in [facility] and look for a) attending/consulting TRH provider listed and b) the [facility] team listed Log into [email or web address] and use [place]'s universal password to access. If you do not have the password, please contact the hospital operator. Locate the [facility] provider you are looking for under [facility] and page to a number that you can be directly reached. If you still have difficulty reaching the provider, please page the [facility] (Director on Call) for the Hospitalists listed on amion for assistance.

## 2020-12-20 NOTE — TOC Progression Note (Signed)
Transition of Care [facility]) - Progression Note    Patient Details  Name: Amy Sandoval MRN: [id number] Date of Birth: 04/18/1935  Transition of [facility] CM/SW Contact  Purcell Mouton, RN Phone Number: 12/20/2020, 11:08 AM  Clinical Narrative:   Spoke with pt concerning home O2. Pt did not have a preference.  Referral given to [facility] in house rep.         Expected Discharge Plan and Services                                                 Social Determinants of Health (SDOH) Interventions    Readmission Risk Interventions No flowsheet data found.

## 2020-12-20 NOTE — Assessment & Plan Note (Signed)
stable °

## 2020-12-20 NOTE — Progress Notes (Signed)
NAME:  Amy Sandoval, MRN:  [id number], DOB:  08/05/1934, LOS: 2 ADMISSION DATE:  12/18/2020, CONSULTATION DATE:  8/19 REFERRING MD:  Dr. Sarajane Jews, CHIEF COMPLAINT:  SOB   History of Present Illness:  86 y/o F who presented to the [facility] ER on 8/18 with reports of shortness of breath.    At baseline, she lives at home alone.  She was seen in a [facility] clinic 8/18 for SOB, nasal congestion and saturations were found to be 83%.  COVID testing was negative. She refused EMS and drove herself to the ER at [facility] for evaluation. On arrival there, she was 60% on room air and cyanotic.  She reported a 3 week hx of increasing shortness of breath, dry cough.  The patient felt it might be relate to the ceiling falling down at her work (a [place]).  She has been vaccinated and boosted for COVID. CXR on admission showed a 4.3 cm mass like lesion in the right apex, hyperexpansion with bibasilar atelectasis vs infiltrate and small bilateral pleural effusions.  Follow up CTA of the chest was completed which was negative for PE, but showed RUL masslike opacity, additional large nodular opacities in the RUL, LLL & small bilateral pleural effusions.  She was admitted per [facility] for further evaluation. ABG showed 7.241 / 91.6 / 115 / bicarbonate 37.8.  PCT negative.  Baseline CO2 on BMP 37 suggestive of chronic hypercarbia. Hgb 15.6, macrocytosis.  She was placed on BiPAP overnight. Currently requiring 3L [place]. She underwent thoracentesis on 8/19 per IR with 450 ml of clear yellow fluid removed.   PCCM consulted 8/19 for pulmonary evaluation.   Pertinent  Medical History  Tobacco Abuse   Significant Hospital Events: Including procedures, antibiotic start and stop dates in addition to other pertinent events   8/18 Admit with SOB, CT notable for RUL mass like opacity, RUL/LLL nodular opacities  Interim History / Subjective:  NAEON, weaning O2  Objective   Blood pressure 132/79, pulse 74, temperature 98 F  (36.7 C), resp. rate 16, height 5\' 5"  (1.651 m), weight 62.2 kg, SpO2 97 %.        Intake/Output Summary (Last 24 hours) at 12/20/2020 0932 Last data filed at 12/20/2020 0700 Gross per 24 hour  Intake 713.1 ml  Output --  Net 713.1 ml    Filed Weights   12/19/20 0000  Weight: 62.2 kg    Examination: General: elderly female lying in bed in NAD  HENT: anicteric, [place] O2 Lungs: non-labored at rest, lungs bilaterally diminished Cardiovascular: s1s2 RRR, no m/r/g Abdomen: soft, non-tender, BSx4 active  Extremities: warm/dry, pitting edema mid shins Neuro: AAOx4, speech clear, MAE    Resolved Hospital Problem list     Assessment & Plan:   RUL Masslike Opacity RUL/LLL Nodular Opacities  Acute on Chronic Hypoxemic Respiratory Failure Tobacco Abuse  Suspect chronic, likely undiagnosed hypoxia given lab findings, ABG.  Prior hx of being told she had "something on her lung" and was treated with antibiotics and states "it got better".  COVID, flu, RVP, procalcitonin negative.  She appears to have chronic bronchial thickening and mild bronchiectasis on CT.  Marland Kitchen  Nodular appearance worrisome for malignancy but differential includes sarcoid, granulomatous disease, mucoid impaction given airway thickening, endemic mycoses, and vasculitis.  HIV negative.  No neutropenia.  No identifiable risk factor for fungal pneumonia.  BNP elevated. Effusion appears transudate (not all studies for Lights sent). Effusions likely related to volume overload, LE edema present on exam. -  empiric abx as ordered -f/u  sputum culture -lasix 8/20 -assess serum galactomannan, blasto/histo antigen, sed rate  -send UA -3% NS + flutter  -will need assessment of ambulatory O2 needs prior to discharge  -will need pulmonary follow up in 1 month with CT imaging to review, will arrange  Best Practice (right click and "Reselect all SmartList Selections" daily)  Per Primary   Labs   CBC: Recent Labs  Lab 12/18/20 1257  12/19/20 0017  WBC 3.5* 5.8  NEUTROABS 2.7 3.5  HGB 15.6* 19.3*  HCT 49.9* 61.9*  MCV 109.4* 111.1*  PLT 120* 123*     Basic Metabolic Panel: Recent Labs  Lab 12/18/20 1257 12/19/20 0017  NA 142 142  K 4.8 4.7  CL 97* 99  CO2 37* 34*  GLUCOSE 80 123*  BUN 12 11  CREATININE 0.70 0.73  CALCIUM 9.0 8.5*  MG  --  1.9  PHOS  --  4.8*    GFR: Estimated Creatinine Clearance: 47.1 mL/min (by C-G formula based on SCr of 0.73 mg/dL). Recent Labs  Lab 12/18/20 1257 12/18/20 1301 12/18/20 2116 12/19/20 0017 12/20/20 0358  PROCALCITON  --   --  <0.10 <0.10 <0.10  WBC 3.5*  --   --  5.8  --   LATICACIDVEN  --  1.0  --   --   --      Liver Function Tests: Recent Labs  Lab 12/18/20 1257 12/19/20 0017  AST 26 24  ALT 19 21  ALKPHOS 100 82  BILITOT 1.2 0.7  PROT 6.6 5.6*  ALBUMIN 3.8 3.3*    No results for input(s): LIPASE, AMYLASE in the last 168 hours. No results for input(s): AMMONIA in the last 168 hours.  ABG    Component Value Date/Time   PHART 7.318 (L) 12/19/2020 0312   PCO2ART 73.5 (HH) 12/19/2020 0312   PO2ART 66.0 (L) 12/19/2020 0312   HCO3 36.6 (H) 12/19/2020 0312   O2SAT 91.9 12/19/2020 0312      Coagulation Profile: No results for input(s): INR, PROTIME in the last 168 hours.  Cardiac Enzymes: No results for input(s): CKTOTAL, CKMB, CKMBINDEX, TROPONINI in the last 168 hours.  HbA1C: No results found for: HGBA1C  CBG: No results for input(s): GLUCAP in the last 168 hours.  Review of Systems:   N/a  Past Medical History:  She,  has no past medical history on file.   Surgical History:  History reviewed. No pertinent surgical history.   Social History:   reports that she has been smoking cigarettes. She has a 30.00 pack-year smoking history. She has been exposed to tobacco smoke. She does not have any smokeless tobacco history on file. She reports current alcohol use. She reports that she does not use drugs.   Family History:   Her family history includes Hypertension in an other family member.   Allergies Allergies  Allergen Reactions   Penicillins      Home Medications  Prior to Admission medications   Not on File     Critical care time: n/a    Lanier Clam, MD See Amion for contact info

## 2020-12-21 ENCOUNTER — Telehealth: Payer: Self-pay | Admitting: Pulmonary Disease

## 2020-12-21 DIAGNOSIS — R911 Solitary pulmonary nodule: Secondary | ICD-10-CM

## 2020-12-21 LAB — CULTURE, RESPIRATORY W GRAM STAIN: Culture: NORMAL

## 2020-12-21 LAB — BASIC METABOLIC PANEL
Anion gap: 7 (ref 5–15)
BUN: 14 mg/dL (ref 8–23)
CO2: 37 mmol/L — ABNORMAL HIGH (ref 22–32)
Calcium: 8.2 mg/dL — ABNORMAL LOW (ref 8.9–10.3)
Chloride: 96 mmol/L — ABNORMAL LOW (ref 98–111)
Creatinine, Ser: 0.55 mg/dL (ref 0.44–1.00)
GFR, Estimated: >60 mL/min (ref >60)
Glucose, Bld: 93 mg/dL (ref 70–99)
Potassium: 4.3 mmol/L (ref 3.5–5.1)
Sodium: 140 mmol/L (ref 135–145)

## 2020-12-21 MED ORDER — AZITHROMYCIN 500 MG PO TABS: 500.0000 mg | ORAL_TABLET | Freq: Every day | ORAL | 0 refills | Status: AC

## 2020-12-21 MED ORDER — CEFDINIR 300 MG PO CAPS: 300.0000 mg | ORAL_CAPSULE | Freq: Two times a day (BID) | ORAL | 0 refills | Status: DC

## 2020-12-21 MED ORDER — FUROSEMIDE 20 MG PO TABS: 20.0000 mg | ORAL_TABLET | Freq: Every day | ORAL | Status: DC

## 2020-12-21 MED ORDER — CEFDINIR 300 MG PO CAPS
300.0000 mg | ORAL_CAPSULE | Freq: Two times a day (BID) | ORAL | Status: DC
  Administered 2020-12-21: 300 mg via ORAL
  Filled 2020-12-21: qty 1

## 2020-12-21 MED ORDER — AZITHROMYCIN 250 MG PO TABS
500.0000 mg | ORAL_TABLET | Freq: Every day | ORAL | Status: DC
  Administered 2020-12-21: 500 mg via ORAL
  Filled 2020-12-21 (×2): qty 2

## 2020-12-21 MED ORDER — FUROSEMIDE 10 MG/ML IJ SOLN
20.0000 mg | Freq: Once | INTRAMUSCULAR | Status: AC
  Administered 2020-12-21: 20 mg via INTRAVENOUS
  Filled 2020-12-21: qty 2

## 2020-12-21 NOTE — Progress Notes (Signed)
Patient discharged home.  IVs removed - WNL.  Reviewed AVS and medications, instructed patient to stop smoking, especially while wearing O2.  Instructed to take PO antiobiotics until prescriptions complete - patient verbalizes understanding.  Patient has no questions at this time, patient in NAD at time of DC.

## 2020-12-21 NOTE — Plan of Care (Signed)
  Problem: Clinical Measurements: Goal: Respiratory complications will improve 12/21/2020 1340 by Annamary Buschman, Leitha Schuller, RN Outcome: Adequate for Discharge 12/21/2020 0816 by Grant Fontana, RN Outcome: Progressing   Patient going home with O2 set up

## 2020-12-21 NOTE — Telephone Encounter (Signed)
Requesting clinic follow up with repeat CT chest in 6 weeks. CT chest ordered.

## 2020-12-21 NOTE — Discharge Summary (Signed)
Physician Discharge Summary  Amy Sandoval EXN:[id number] DOB: [birth year] DOA: 12/18/2020  PCP: Pcp, No  Admit date: 12/18/2020 Discharge date: 12/21/2020  Recommendations for Outpatient Follow-up:  * Acute respiratory failure with hypoxia and hypercapnia (HCC) -- Continue to treat as pneumonia.  Fungal infection thought unlikely.  Probably has chronic hypoxic and hypercapnic respiratory failure as suggested by polycythemia, asymptomatic state despite profound hypoxia, relatively compensated hypercapnia. -- Discharged home on home oxygen, stable 4 L  Multifocal pneumonia -- Continue empiric antibiotics. Continue management per pulmonology.  Follow-up CT as outpatient with pulmonology.  Pleural effusion -- Status postthoracentesis 8/19.  Culture pending.  Thrombocytopenia (HCC) -- Mild.  Follow clinically.  Consider repeat CBC as an outpatient.  Emphysema lung ([place]) -- Follow-up with pulmonology as an outpatient.  Appears stable at this time.  Elevated TSH --mild, recheck as outpatient   Follow-up [name] up.   Why: Your Oxygen is from [employer]. Please call the OFFICE NUMBER [phone], if you have any problems or questions.        [name], Bonna Gains, MD. Schedule an appointment as soon as possible for a visit in 1 month(s).   Specialty: Pulmonary Disease Contact information: [address] [place] [address] [place] [address] [phone]                  Discharge Diagnoses: Principal diagnosis is #1 Principal Problem:   Acute respiratory failure with hypoxia and hypercapnia (HCC) Active Problems:   Multifocal pneumonia   Pleural effusion   Emphysema lung (HCC)   Thrombocytopenia (HCC)   Tobacco abuse   Lung mass   Elevated troponin   Polycythemia   Elevated TSH   Aortic atherosclerosis (HCC)   Chronic diastolic CHF (congestive heart failure) ([place])   Discharge Condition: improved Disposition: home  Diet  recommendation:  Diet Orders (From admission, onward)     Start     Ordered   12/21/20 0000  Diet general        12/21/20 1250   12/19/20 1038  Diet regular Room service appropriate? Yes; Fluid consistency: Thin  Diet effective now       Question Answer Comment  Room service appropriate? Yes   Fluid consistency: Thin      12/19/20 1037             Filed Weights   12/19/20 0000  Weight: 62.2 kg    HPI/Hospital Course:   86 year old woman longtime smoker, undiagnosed emphysema, presented with shortness of breath, found to be hypoxic, admitted for further evaluation of lung abnormalities.  Initially required BiPAP, quickly weaned off but remained hypoxic requiring supplemental oxygen.  Given constellation of findings,, certainly has had chronic hypoxia for some time.  Discharged home on oxygen.  Seen by pulmonology given her abnormal CT, plan to treat with empiric antibiotics for now, follow-up in 6 weeks as an outpatient with repeat CT.  Other studies pending as below.  * Acute respiratory failure with hypoxia and hypercapnia (HCC) -- Continue to treat as pneumonia.  Fungal infection thought unlikely.  Probably has chronic hypoxic and hypercapnic respiratory failure as suggested by polycythemia, asymptomatic state despite profound hypoxia, relatively compensated hypercapnia. -- Discharged home on home oxygen, stable 4 L  Multifocal pneumonia -- Continue empiric antibiotics. Continue management per pulmonology.  Follow-up CT as outpatient with pulmonology.  Pleural effusion -- Status postthoracentesis 8/19.  Culture pending.  Thrombocytopenia (HCC) -- Mild.  Follow clinically.  Consider repeat CBC as an  outpatient.  Emphysema lung ([place]) -- Follow-up with pulmonology as an outpatient.  Appears stable at this time.  Chronic diastolic CHF (congestive heart failure) (HCC) --stable  Aortic atherosclerosis (HCC) Consider statin as outpatient if indicated  Elevated TSH --mild,  recheck as outpatient  Polycythemia -- Secondary to smoking.  No treatment indicated.  Elevated troponin --flat, trivial, no s/s of ACS, no further evaluation    Consults:  Pulmonology     Micro Data:  Pleural fluid culture pending Respiratory culture pending BC NGTD   Antimicrobials:  Azithromycin 8/18 > Ceftriaxone 8/18 >  Today's assessment: S: CC: f/u SOB  Feels better, breathing ok Ready to go home  O: Vitals:  Vitals:   12/21/20 0759 12/21/20 1209  BP:  114/60  Pulse:  71  Resp:  18  Temp:  98.2 F (36.8 C)  SpO2: 90% 92%    Constitutional:  Appears calm and comfortable Respiratory:  CTA bilaterally, no w/r/r.  Respiratory effort normal.  Cardiovascular:  RRR, no m/r/g No LE extremity edema   Psychiatric:  Mental status Mood, affect appropriate  BMP noted  Discharge Instructions  Discharge Instructions     Diet general   Complete by: As directed    Discharge instructions   Complete by: As directed    Call your physician or seek immediate medical attention for shortness of breath, pain, fever, confusion or worsening of condition.   Increase activity slowly   Complete by: As directed       Allergies as of 12/21/2020       Reactions   Penicillins         Medication List     TAKE these medications    azithromycin 500 MG tablet Commonly known as: Zithromax Take 1 tablet (500 mg total) by mouth daily for 2 days. Start tomorrow   cefdinir 300 MG capsule Commonly known as: OMNICEF Take 1 capsule (300 mg total) by mouth every 12 (twelve) hours.               Durable Medical Equipment  (From admission, onward)           Start     Ordered   12/20/20 0852  For home use only DME oxygen  Once       Question Answer Comment  Length of Need Lifetime   Mode or (Route) Nasal cannula   Liters per Minute 4   Frequency Continuous (stationary and portable oxygen unit needed)   Oxygen delivery system Gas      12/20/20 0851            Allergies  Allergen Reactions   Penicillins     The results of significant diagnostics from this hospitalization (including imaging, microbiology, ancillary and laboratory) are listed below for reference.    Significant Diagnostic Studies: DG Chest 1 View  Result Date: 12/19/2020 CLINICAL DATA:  Post thoracentesis EXAM: CHEST  1 VIEW COMPARISON:  Chest x-ray 12/18/2020, CT chest 12/18/2020 FINDINGS: Decreased right pleural effusion post thoracentesis. No definitive pneumothorax seen. Right apical masslike opacity. Small left-sided pleural effusion with basilar airspace disease. Stable cardiomegaly with aortic atherosclerosis. IMPRESSION: 1. Decreased right pleural effusion post thoracentesis without visible pneumothorax. Small residual with atelectasis right base 2. Small left-sided pleural effusion and left basilar airspace disease 3. Cardiomegaly 4. Right apical masslike opacity as before Electronically Signed   By: Donavan Foil M.D.   On: 12/19/2020 16:26   DG Chest 2 View  Result Date: 12/18/2020 CLINICAL DATA:  Shortness of breath. EXAM: CHEST - 2 VIEW COMPARISON:  None. FINDINGS: Lungs are hyperexpanded. Interstitial markings are diffusely coarsened with chronic features. Bibasilar atelectasis or infiltrate with small bilateral pleural effusions noted. 4.3 cm pulmonary mass lesion identified in the right lung apex. The visualized bony structures of the thorax show no acute abnormality. Telemetry leads overlie the chest. IMPRESSION: 1. 4.3 cm mass lesion in the right lung apex. Chest CT with contrast recommended to further evaluate. 2. Hyperexpansion with bibasilar atelectasis or infiltrate and small bilateral pleural effusions. I personally called these results to Dr. Melina Copa in the emergency department at approximately 1345 hours on 12/18/2020. Electronically Signed   By: Misty Stanley M.D.   On: 12/18/2020 13:46   CT Angio Chest PE W/Cm &/Or Wo Cm  Result Date:  12/18/2020 CLINICAL DATA:  Low O2 saturation EXAM: CT ANGIOGRAPHY CHEST WITH CONTRAST TECHNIQUE: Multidetector CT imaging of the chest was performed using the standard protocol during bolus administration of intravenous contrast. Multiplanar CT image reconstructions and MIPs were obtained to evaluate the vascular anatomy. CONTRAST:  62mL OMNIPAQUE IOHEXOL 350 MG/ML SOLN COMPARISON:  None. FINDINGS: Cardiovascular: A CT contrast opacification of the pulmonary arteries. No evidence of pulmonary embolus. Cardiomegaly. No pericardial effusion. Atherosclerotic disease of the thoracic aorta calcifications of the LAD. Mediastinum/Nodes: No pathologically enlarged lymph nodes seen in the chest. Esophagus and thyroid are unremarkable. Lungs/Pleura: Central airways are patent. Mild upper lobe predominant centrilobular emphysema. Bilateral bronchial wall thickening with areas of mucous plugging. Small right-greater-than-left pleural effusions and atelectasis. Masslike nodular opacity of the right upper lobe measuring 3.9 x 2.6 cm additional nodular opacity is seen posteriorly in the right upper lobe measuring 2.8 x 1.9 cm. Additional nodular opacity of the left lower lobe measuring 1.6 x 1.5 cm on series 5, image 158. Upper Abdomen: No acute abnormality. Musculoskeletal: No chest wall abnormality. No acute or significant osseous findings. Review of the MIP images confirms the above findings. IMPRESSION: No evidence of pulmonary embolus. Right upper lobe masslike opacity. Additional large nodular opacities are seen in the right upper lobe and left lower lobe. Differential includes multifocal infectious process (including fungal etiologies) versus primary lung neoplasm. A 1 month follow CT should be performed following therapy. Small bilateral pleural effusions and atelectasis. Aortic Atherosclerosis (ICD10-I70.0) and Emphysema (ICD10-J43.9). I personally called these results to Dr. Melina Copa in the emergency department at  approximately 0334 hours on 12/18/2020. Electronically Signed   By: Yetta Glassman M.D.   On: 12/18/2020 15:36   DG CHEST PORT 1 VIEW  Result Date: 12/18/2020 CLINICAL DATA:  Respiratory failure EXAM: PORTABLE CHEST 1 VIEW COMPARISON:  Film from earlier in the same day. FINDINGS: Cardiac shadow is stable. The lungs are well aerated bilaterally. Mild vascular congestion is noted with mild increasing interstitial edema and small effusions. Right apical density is again identified and stable. IMPRESSION: Increasing vascular congestion and interstitial edema with effusions. Electronically Signed   By: Inez Catalina M.D.   On: 12/18/2020 23:40   ECHOCARDIOGRAM COMPLETE  Result Date: 12/19/2020    ECHOCARDIOGRAM REPORT   Patient Name:   ALLIE OUSLEY Date of Exam: 12/19/2020 Medical Rec #:  [id number]       Height:       65.0 in Accession #:    [id number]      Weight:       137.1 lb Date of Birth:  08/25/1934        BSA:  1.685 m Patient Age:    38 years        BP:           127/71 mmHg Patient Gender: F               HR:           72 bpm. Exam Location:  Inpatient Procedure: 2D Echo, Cardiac Doppler and Color Doppler Indications:    Elevated troponin  History:        Patient has no prior history of Echocardiogram examinations.  Sonographer:    Luisa Hart RDCS Referring Phys: [place]  1. Left ventricular ejection fraction, by estimation, is 60 to 65%. The left ventricle has normal function. The left ventricle has no regional wall motion abnormalities. Left ventricular diastolic parameters are consistent with Grade II diastolic dysfunction (pseudonormalization).  2. Right ventricular systolic function is normal. The right ventricular size is moderately enlarged. D-shaped interventricular septum suggesting RV pressure/volume overload. There is mildly elevated pulmonary artery systolic pressure. The estimated right ventricular systolic pressure is 53.9 mmHg.  3. Right atrial size  was mildly dilated.  4. The mitral valve is normal in structure. No evidence of mitral valve regurgitation. No evidence of mitral stenosis.  5. The aortic valve is tricuspid. Aortic valve regurgitation is not visualized. Mild aortic valve sclerosis is present, with no evidence of aortic valve stenosis.  6. The inferior vena cava is normal in size with greater than 50% respiratory variability, suggesting right atrial pressure of 3 mmHg. FINDINGS  Left Ventricle: Left ventricular ejection fraction, by estimation, is 60 to 65%. The left ventricle has normal function. The left ventricle has no regional wall motion abnormalities. The left ventricular internal cavity size was normal in size. There is  no left ventricular hypertrophy. Left ventricular diastolic parameters are consistent with Grade II diastolic dysfunction (pseudonormalization). Right Ventricle: The right ventricular size is moderately enlarged. No increase in right ventricular wall thickness. Right ventricular systolic function is normal. There is mildly elevated pulmonary artery systolic pressure. The tricuspid regurgitant velocity is 3.02 m/s, and with an assumed right atrial pressure of 3 mmHg, the estimated right ventricular systolic pressure is 76.7 mmHg. Left Atrium: Left atrial size was normal in size. Right Atrium: Right atrial size was mildly dilated. Pericardium: Trivial pericardial effusion is present. Mitral Valve: The mitral valve is normal in structure. There is mild calcification of the mitral valve leaflet(s). Mild mitral annular calcification. No evidence of mitral valve regurgitation. No evidence of mitral valve stenosis. MV peak gradient, 5.9 mmHg. The mean mitral valve gradient is 2.0 mmHg. Tricuspid Valve: The tricuspid valve is normal in structure. Tricuspid valve regurgitation is trivial. Aortic Valve: The aortic valve is tricuspid. Aortic valve regurgitation is not visualized. Mild aortic valve sclerosis is present, with no evidence  of aortic valve stenosis. Aortic valve mean gradient measures 6.0 mmHg. Aortic valve peak gradient measures 10.6 mmHg. Aortic valve area, by VTI measures 1.36 cm. Pulmonic Valve: The pulmonic valve was normal in structure. Pulmonic valve regurgitation is not visualized. Aorta: The aortic root is normal in size and structure. Venous: The inferior vena cava is normal in size with greater than 50% respiratory variability, suggesting right atrial pressure of 3 mmHg. IAS/Shunts: No atrial level shunt detected by color flow Doppler.  LEFT VENTRICLE PLAX 2D LVIDd:         3.80 cm     Diastology LVIDs:         2.20 cm  LV e' medial:    6.42 cm/s LV PW:         1.05 cm     LV E/e' medial:  15.9 LV IVS:        1.10 cm     LV e' lateral:   6.20 cm/s LVOT diam:     1.60 cm     LV E/e' lateral: 16.5 LV SV:         46 LV SV Index:   27 LVOT Area:     2.01 cm  LV Volumes (MOD) LV vol d, MOD A2C: 46.3 ml LV vol d, MOD A4C: 39.4 ml LV vol s, MOD A2C: 13.4 ml LV vol s, MOD A4C: 10.7 ml LV SV MOD A2C:     32.9 ml LV SV MOD A4C:     39.4 ml LV SV MOD BP:      32.4 ml RIGHT VENTRICLE RV Basal diam:  3.80 cm RV Mid diam:    2.60 cm TAPSE (M-mode): 2.2 cm LEFT ATRIUM             Index       RIGHT ATRIUM           Index LA diam:        2.90 cm 1.72 cm/m  RA Area:     19.30 cm LA Vol (A2C):   30.9 ml 18.34 ml/m RA Volume:   63.30 ml  37.57 ml/m LA Vol (A4C):   40.5 ml 24.04 ml/m LA Biplane Vol: 36.1 ml 21.42 ml/m  AORTIC VALVE AV Area (Vmax):    1.33 cm AV Area (Vmean):   1.35 cm AV Area (VTI):     1.36 cm AV Vmax:           163.00 cm/s AV Vmean:          107.000 cm/s AV VTI:            0.335 m AV Peak Grad:      10.6 mmHg AV Mean Grad:      6.0 mmHg LVOT Vmax:         108.00 cm/s LVOT Vmean:        71.700 cm/s LVOT VTI:          0.227 m LVOT/AV VTI ratio: 0.68  AORTA Ao Root diam: 2.40 cm MITRAL VALVE                TRICUSPID VALVE MV Area (PHT): 3.30 cm     TR Peak grad:   36.5 mmHg MV Area VTI:   1.37 cm     TR Vmax:         302.00 cm/s MV Peak grad:  5.9 mmHg MV Mean grad:  2.0 mmHg     SHUNTS MV Vmax:       1.21 m/s     Systemic VTI:  0.23 m MV Vmean:      63.7 cm/s    Systemic Diam: 1.60 cm MV Decel Time: 230 msec MV E velocity: 102.00 cm/s MV A velocity: 102.00 cm/s MV E/A ratio:  1.00 Dalton McleanMD Electronically signed by Franki Monte Signature Date/Time: 12/19/2020/10:11:39 AM    Final    US THORACENTESIS ASP PLEURAL SPACE W/IMG GUIDE  Result Date: 12/19/2020 INDICATION: Patient with recently discovered lung mass, bilateral pleural effusions. Request is made for diagnostic and therapeutic thoracentesis. EXAM: ULTRASOUND GUIDED DIAGNOSTIC AND THERAPEUTIC RIGHT THORACENTESIS MEDICATIONS: 10 mL 1% lidocaine COMPLICATIONS: None immediate. PROCEDURE: An ultrasound guided thoracentesis was  thoroughly discussed with the patient and questions answered. The benefits, risks, alternatives and complications were also discussed. The patient understands and wishes to proceed with the procedure. Written consent was obtained. Ultrasound was performed to localize and mark an adequate pocket of fluid in the right chest. The area was then prepped and draped in the normal sterile fashion. 1% Lidocaine was used for local anesthesia. Under ultrasound guidance a 6 Fr Safe-T-Centesis catheter was introduced. Thoracentesis was performed. The catheter was removed and a dressing applied. FINDINGS: A total of approximately 450 mL of yellow, clear fluid was removed. Samples were sent to the laboratory as requested by the clinical team. IMPRESSION: Successful ultrasound guided diagnostic and therapeutic right thoracentesis yielding 450 mL of pleural fluid. Read by: Brynda Greathouse PA-C Electronically Signed   By: Jacqulynn Cadet M.D.   On: 12/19/2020 15:34    Microbiology: Recent Results (from the past 240 hour(s))  Culture, blood (routine x 2)     Status: None (Preliminary result)   Collection Time: 12/18/20 12:57 PM   Specimen: Left  Antecubital; Blood  Result Value Ref Range Status   Specimen Description   Final    LEFT ANTECUBITAL Performed at [facility], [address] [place], [place] [address]    Special Requests   Final    BOTTLES DRAWN AEROBIC AND ANAEROBIC Blood Culture adequate volume   Culture   Final    NO GROWTH 3 DAYS Performed at [facility] [address], [place], [place] [address]    Report Status PENDING  Incomplete  Resp Panel by RT-PCR (Flu A&B, Covid) Nasopharyngeal Swab     Status: None   Collection Time: 12/18/20 12:57 PM   Specimen: Nasopharyngeal Swab; Nasopharyngeal(NP) swabs in vial transport medium  Result Value Ref Range Status   SARS Coronavirus 2 by RT PCR NEGATIVE NEGATIVE Final    Comment: (NOTE) SARS-CoV-2 target nucleic acids are NOT DETECTED.  The SARS-CoV-2 RNA is generally detectable in upper respiratory specimens during the acute phase of infection. The lowest concentration of SARS-CoV-2 viral copies this assay can detect is 138 copies/mL. A negative result does not preclude SARS-Cov-2 infection and should not be used as the sole basis for treatment or other patient management decisions. A negative result may occur with  improper specimen collection/handling, submission of specimen other than nasopharyngeal swab, presence of viral mutation(s) within the areas targeted by this assay, and inadequate number of viral copies(<138 copies/mL). A negative result must be combined with clinical observations, patient history, and epidemiological information. The expected result is Negative.  Fact Sheet for Patients:  EntrepreneurPulse.com.au  Fact Sheet for Healthcare Providers:  IncredibleEmployment.be  This test is no t yet approved or cleared by the [place] FDA and  has been authorized for detection and/or diagnosis of SARS-CoV-2 by FDA under an Emergency Use Authorization (EUA). This EUA will remain   in effect (meaning this test can be used) for the duration of the COVID-19 declaration under Section 564(b)(1) of the Act, 21 U.S.C.section 360bbb-3(b)(1), unless the authorization is terminated  or revoked sooner.       Influenza A by PCR NEGATIVE NEGATIVE Final   Influenza B by PCR NEGATIVE NEGATIVE Final    Comment: (NOTE) The Xpert Xpress SARS-CoV-2/FLU/RSV plus assay is intended as an aid in the diagnosis of influenza from Nasopharyngeal swab specimens and should not be used as a sole basis for treatment. Nasal washings and aspirates are unacceptable for Xpert Xpress SARS-CoV-2/FLU/RSV testing.  Fact  Sheet for Patients: EntrepreneurPulse.com.au  Fact Sheet for Healthcare Providers: IncredibleEmployment.be  This test is not yet approved or cleared by the [place] FDA and has been authorized for detection and/or diagnosis of SARS-CoV-2 by FDA under an Emergency Use Authorization (EUA). This EUA will remain in effect (meaning this test can be used) for the duration of the COVID-19 declaration under Section 564(b)(1) of the Act, 21 U.S.C. section 360bbb-3(b)(1), unless the authorization is terminated or revoked.  Performed at [facility], [address] [place], [place] [address]   Culture, blood (routine x 2)     Status: None (Preliminary result)   Collection Time: 12/18/20  2:00 PM   Specimen: Right Antecubital; Blood  Result Value Ref Range Status   Specimen Description   Final    RIGHT ANTECUBITAL Performed at [facility], [address] [place], [place] [address]    Special Requests   Final    BOTTLES DRAWN AEROBIC AND ANAEROBIC Blood Culture adequate volume   Culture   Final    NO GROWTH 3 DAYS Performed at [facility] [address] [place], [place] [address]    Report Status PENDING  Incomplete  Expectorated Sputum Assessment w Gram Stain, Rflx to Resp Cult      Status: None   Collection Time: 12/18/20  9:00 PM   Specimen: Expectorated Sputum  Result Value Ref Range Status   Specimen Description EXPECTORATED SPUTUM  Final   Special Requests NONE  Final   Sputum evaluation   Final    THIS SPECIMEN IS ACCEPTABLE FOR SPUTUM CULTURE Performed at [facility], [place] [address], [place], [place] [address]    Report Status 12/18/2020 FINAL  Final  MRSA Next Gen by PCR, Nasal     Status: None   Collection Time: 12/18/20  9:00 PM   Specimen: Nasal Mucosa; Nasal Swab  Result Value Ref Range Status   MRSA by PCR Next Gen NOT DETECTED NOT DETECTED Final    Comment: (NOTE) The GeneXpert MRSA Assay (FDA approved for NASAL specimens only), is one component of a comprehensive MRSA colonization surveillance program. It is not intended to diagnose MRSA infection nor to guide or monitor treatment for MRSA infections. Test performance is not FDA approved in patients less than 3 years old. Performed at [facility], [place] [address], [place], [place] [address]   Culture, Respiratory w Gram Stain     Status: None   Collection Time: 12/18/20  9:00 PM  Result Value Ref Range Status   Specimen Description   Final    EXPECTORATED SPUTUM Performed at [facility], [place] [address], [place], [place] [address]    Special Requests   Final    NONE Reflexed from [id number] Performed at [facility] [address] [place], [place] [address]    Gram Stain   Final    RARE SQUAMOUS EPITHELIAL CELLS PRESENT RARE WBC PRESENT,BOTH PMN AND MONONUCLEAR FEW GRAM POSITIVE COCCI FEW GRAM NEGATIVE RODS    Culture   Final    FEW Normal respiratory flora-no Staph aureus or Pseudomonas seen Performed at [facility], [address], [place],  [address]    Report Status 12/21/2020 FINAL  Final  Respiratory (~20 pathogens) panel by PCR     Status: None   Collection Time: 12/19/20  8:47 AM    Specimen: Nasopharyngeal Swab; Respiratory  Result Value Ref Range Status   Adenovirus NOT DETECTED  NOT DETECTED Final   Coronavirus 229E NOT DETECTED NOT DETECTED Final    Comment: (NOTE) The Coronavirus on the Respiratory Panel, DOES NOT test for the novel  Coronavirus (2019 nCoV)    Coronavirus HKU1 NOT DETECTED NOT DETECTED Final   Coronavirus NL63 NOT DETECTED NOT DETECTED Final   Coronavirus OC43 NOT DETECTED NOT DETECTED Final   Metapneumovirus NOT DETECTED NOT DETECTED Final   Rhinovirus / Enterovirus NOT DETECTED NOT DETECTED Final   Influenza A NOT DETECTED NOT DETECTED Final   Influenza B NOT DETECTED NOT DETECTED Final   Parainfluenza Virus 1 NOT DETECTED NOT DETECTED Final   Parainfluenza Virus 2 NOT DETECTED NOT DETECTED Final   Parainfluenza Virus 3 NOT DETECTED NOT DETECTED Final   Parainfluenza Virus 4 NOT DETECTED NOT DETECTED Final   Respiratory Syncytial Virus NOT DETECTED NOT DETECTED Final   Bordetella pertussis NOT DETECTED NOT DETECTED Final   Bordetella Parapertussis NOT DETECTED NOT DETECTED Final   Chlamydophila pneumoniae NOT DETECTED NOT DETECTED Final   Mycoplasma pneumoniae NOT DETECTED NOT DETECTED Final    Comment: Performed at [facility], [place] [address] [place], [place] [address]  Body fluid culture w Gram Stain     Status: None (Preliminary result)   Collection Time: 12/19/20  4:00 PM   Specimen: PATH Cytology Pleural fluid  Result Value Ref Range Status   Specimen Description   Final    PLEURAL Performed at [place] [address], [place], [place] [address]    Special Requests   Final    NONE Performed at [facility], [place] [address] [place], [place] [address]    Gram Stain   Final    RARE WBC PRESENT,BOTH PMN AND MONONUCLEAR NO ORGANISMS SEEN    Culture   Final    NO GROWTH 1 DAY Performed at [facility], [place] [address] [place], [place] [address]    Report Status PENDING   Incomplete     Labs: Basic Metabolic Panel: Recent Labs  Lab 12/18/20 1257 12/19/20 0017 12/21/20 0333  NA 142 142 140  K 4.8 4.7 4.3  CL 97* 99 96*  CO2 37* 34* 37*  GLUCOSE 80 123* 93  BUN 12 11 14   CREATININE 0.70 0.73 0.55  CALCIUM 9.0 8.5* 8.2*  MG  --  1.9  --   PHOS  --  4.8*  --    Liver Function Tests: Recent Labs  Lab 12/18/20 1257 12/19/20 0017  AST 26 24  ALT 19 21  ALKPHOS 100 82  BILITOT 1.2 0.7  PROT 6.6 5.6*  ALBUMIN 3.8 3.3*    CBC: Recent Labs  Lab 12/18/20 1257 12/19/20 0017  WBC 3.5* 5.8  NEUTROABS 2.7 3.5  HGB 15.6* 19.3*  HCT 49.9* 61.9*  MCV 109.4* 111.1*  PLT 120* 123*    Recent Labs    12/19/20 1848  BNP 268.8*   Principal Problem:   Acute respiratory failure with hypoxia and hypercapnia (HCC) Active Problems:   Multifocal pneumonia   Pleural effusion   Emphysema lung (HCC)   Thrombocytopenia (HCC)   Tobacco abuse   Lung mass   Elevated troponin   Polycythemia   Elevated TSH   Aortic atherosclerosis (HCC)   Chronic diastolic CHF (congestive heart failure) ([place])   Time coordinating discharge: 35 minutes  Signed:  Murray Hodgkins, MD  [facility]  12/21/2020, 2:08 PM

## 2020-12-21 NOTE — Plan of Care (Signed)
  Problem: Clinical Measurements: Goal: Diagnostic test results will improve Outcome: Progressing Goal: Respiratory complications will improve Outcome: Progressing   Problem: Activity: Goal: Risk for activity intolerance will decrease Outcome: Adequate for Discharge   Problem: Nutrition: Goal: Adequate nutrition will be maintained Outcome: Adequate for Discharge

## 2020-12-21 NOTE — TOC Transition Note (Signed)
Transition of Care [facility]) - CM/SW Discharge Note   Patient Details  Name: Amy Sandoval MRN: [id number] Date of Birth: 12-04-1934  Transition of Care [facility]) CM/SW Contact:  Ova Freshwater Phone Number: [phone] 12/21/2020, 10:59 AM   Clinical Narrative:     Patient will d/c home with O2 DME, confirmed with Jermaine w/ [employer].  Patient's daughter Florene Route [phone] will transport patient home.  Attending and Unit RN updated on discharge plan.  Final next level of care: Home/Self Care Barriers to Discharge: No Barriers Identified   Patient Goals and CMS Choice Patient states their goals for this hospitalization and ongoing recovery are:: To get better, to return home. CMS Medicare.gov Compare Post Acute Care list provided to:: Patient Choice offered to / list presented to : Patient  Discharge Placement                       Discharge Plan and Services                                     Social Determinants of Health (SDOH) Interventions     Readmission Risk Interventions No flowsheet data found.

## 2020-12-22 ENCOUNTER — Telehealth: Payer: Self-pay | Admitting: Pulmonary Disease

## 2020-12-22 DIAGNOSIS — R911 Solitary pulmonary nodule: Secondary | ICD-10-CM

## 2020-12-22 LAB — LEGIONELLA PNEUMOPHILA SEROGP 1 UR AG: L. pneumophila Serogp 1 Ur Ag: NEGATIVE

## 2020-12-22 NOTE — Telephone Encounter (Signed)
Patient is already scheduled with SG on 09/16. Will close encounter.

## 2020-12-22 NOTE — Telephone Encounter (Signed)
I have called the pt and she is stated that she is needing to have a larger POC to use at work.  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  She is needing something that she can take to work with her that will last while she is at work.  MH please advise.    Pt did schedule OV with SG for 01/14/21.  DME for pt is [employer]

## 2020-12-23 LAB — CULTURE, BLOOD (ROUTINE X 2)
Culture: NO GROWTH
Culture: NO GROWTH
Special Requests: ADEQUATE
Special Requests: ADEQUATE

## 2020-12-23 LAB — BODY FLUID CULTURE W GRAM STAIN: Culture: NO GROWTH

## 2020-12-23 LAB — CYTOLOGY - NON PAP

## 2020-12-24 LAB — BLASTOMYCES ANTIGEN: Blastomyces Antigen: NOT DETECTED ng/mL

## 2020-12-24 LAB — FUNGITELL, SERUM: Fungitell Result: <31 pg/mL (ref <80)

## 2020-12-24 NOTE — Telephone Encounter (Signed)
OK with POC device or larger tanks. Needs 4L continuous so if POC concentrator needs at least 5L pulsed.

## 2020-12-24 NOTE — Telephone Encounter (Signed)
Called patient but she did not answer. Left message for her to call back.  

## 2020-12-26 NOTE — Telephone Encounter (Signed)
Pcc's do you know if it would be possible for the patient to get an additional concentrator from [place] or another DME if needed at her business?

## 2020-12-26 NOTE — Telephone Encounter (Signed)
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 She states that she is there more than she is at home. The address is [address]. The business is called [employer].  Advised her that we could put in an order to try and get her a concentrator and we will let her know.   Dr. Silas Flood are you ok with [place] placing order for additional concentrator?

## 2020-12-26 NOTE — Telephone Encounter (Signed)
LM informing patient new order has been placed to get her an additional concentrator.   DME may or may not cover additional concentrator. They may also need qualifying oxygen data and she was not qualified in this office.   Patient has an appt as a hospital f/u 9/14 to get established in clinic.   Nothing further needed at this time.

## 2020-12-26 NOTE — Telephone Encounter (Signed)
I have no idea we could put a order in and send it to them I guess it would depend on there insurance

## 2020-12-28 LAB — JAK2 GENOTYPR

## 2020-12-29 LAB — MISC LABCORP TEST (SEND OUT): Labcorp test code: 9985

## 2021-01-01 LAB — ACID FAST SMEAR (AFB, MYCOBACTERIA): Acid Fast Smear: NEGATIVE

## 2021-01-14 ENCOUNTER — Ambulatory Visit (INDEPENDENT_AMBULATORY_CARE_PROVIDER_SITE_OTHER): Payer: Medicare Other | Admitting: Acute Care

## 2021-01-14 ENCOUNTER — Encounter: Payer: Self-pay | Admitting: Acute Care

## 2021-01-14 ENCOUNTER — Other Ambulatory Visit: Payer: Self-pay

## 2021-01-14 VITALS — BP 100/64 | HR 85 | Temp 97.3°F | Ht 65.0 in | Wt 118.8 lb

## 2021-01-14 DIAGNOSIS — J189 Pneumonia, unspecified organism: Secondary | ICD-10-CM | POA: Diagnosis not present

## 2021-01-14 DIAGNOSIS — J90 Pleural effusion, not elsewhere classified: Secondary | ICD-10-CM | POA: Diagnosis not present

## 2021-01-14 DIAGNOSIS — J9601 Acute respiratory failure with hypoxia: Secondary | ICD-10-CM

## 2021-01-14 DIAGNOSIS — J9602 Acute respiratory failure with hypercapnia: Secondary | ICD-10-CM | POA: Diagnosis not present

## 2021-01-14 DIAGNOSIS — I5032 Chronic diastolic (congestive) heart failure: Secondary | ICD-10-CM | POA: Diagnosis not present

## 2021-01-14 MED ORDER — TRELEGY ELLIPTA 200-62.5-25 MCG/INH IN AEPB: 1.0000 | INHALATION_SPRAY | Freq: Every day | RESPIRATORY_TRACT | 0 refills | Status: DC

## 2021-01-14 NOTE — Progress Notes (Signed)
History of Present Illness Amy Sandoval is a 86 y.o. female current every day smoker  with a 30 + pack year smoking history, admitted 8/18-8/21/2022 with acute respiratory failure, multifocal pneumonia, pleural effusions , heart failure, COPD and emphysema.  CT scan showed 2 masslike opacities in the right upper lobe, apical area as well as a left lower lobe suspicious looking nodule.  Pt. Was seen as an inpatient  consult by Dr. Erin Fulling.    01/20/2021 Pt. Presents for follow up after admission for acute respiratory failure, multifocal pneumonia, pleural effusions , COPD and emphysema., chronic hypercarbia. She was treated with oxygen, antimicrobial therapy, thoracentesis. ( 450 cc's clear yellow fluid removed) She was discharged home on 4L of oxygen per [place]. She needs a 6 week follow up super D CT Chest to follow up on pulmonary nodules, as there was a mass noted on the CTA she had done as an inpatient. Marland Kitchen   Upon follow up today in the office  patient states she has been doing better. She states she does have drops in her oxygen saturations . Saturation was 86% on RA today as she came in off her home oxygen. We discussed the importance of wearing her oxygen at all times.  She refused an inhaler at discharge. She denises wheezing or any secretions. I have offered her a maintenance  inhaler , and she is agreeable to try Trelegy after we discussed the benefits of therapy.She denies fever, chest pain .   Called about oxygen POC and concentrator, we have placed orders to DME.   Test Results: Cultures 12/19/2020 Blastomyces Antigen>> None detected Fungal Culture>> Pending Pleural AFB negative 8/18 Resp Cx>> Normal [place]ora Blood Cx Negative  12/18/2020 CTA Chest Lungs/Pleura: Central airways are patent. Mild upper lobe predominant centrilobular emphysema. Bilateral bronchial wall thickening with areas of mucous plugging. Small right-greater-than-left pleural effusions and atelectasis.  Masslike nodular opacity of the right upper lobe measuring 3.9 x 2.6 cm additional nodular opacity is seen posteriorly in the right upper lobe measuring 2.8 x 1.9 cm. Additional nodular opacity of the left lower lobe measuring 1.6 x 1.5 cm on series 5, image 158.  CBC Latest Ref Rng & Units 12/19/2020 12/18/2020  WBC 4.0 - 10.5 K/uL 5.8 3.5(L)  Hemoglobin 12.0 - 15.0 g/dL 19.3(H) 15.6(H)  Hematocrit 36.0 - 46.0 % 61.9(H) 49.9(H)  Platelets 150 - 400 K/uL 123(L) 120(L)    BMP Latest Ref Rng & Units 12/21/2020 12/19/2020 12/18/2020  Glucose 70 - 99 mg/dL 93 123(H) 80  BUN 8 - 23 mg/dL 14 11 12   Creatinine 0.44 - 1.00 mg/dL 0.55 0.73 0.70  Sodium 135 - 145 mmol/L 140 142 142  Potassium 3.5 - 5.1 mmol/L 4.3 4.7 4.8  Chloride 98 - 111 mmol/L 96(L) 99 97(L)  CO2 22 - 32 mmol/L 37(H) 34(H) 37(H)  Calcium 8.9 - 10.3 mg/dL 8.2(L) 8.5(L) 9.0    BNP    Component Value Date/Time   BNP 268.8 (H) 12/19/2020 1848    ProBNP No results found for: PROBNP  PFT No results found for: FEV1PRE, FEV1POST, FVCPRE, FVCPOST, TLC, DLCOUNC, PREFEV1FVCRT, PSTFEV1FVCRT  No results found.   Past medical hx History reviewed. No pertinent past medical history.   Social History   Tobacco Use   Smoking status: Some Days    Packs/day: 0.50    Years: 60.00    Pack years: 30.00    Types: Cigarettes    Passive exposure: Past   Smokeless tobacco: Never  Vaping Use  Vaping Use: Former   Substances: Nicotine, Flavoring  Substance Use Topics   Alcohol use: Yes    Comment: Social   Drug use: Never    Amy Sandoval reports that she has been smoking cigarettes. She has a 30.00 pack-year smoking history. She has been exposed to tobacco smoke. She has never used smokeless tobacco. She reports current alcohol use. She reports that she does not use drugs.  Tobacco Cessation: Current every day smoker, 60 pack year smoking history She has been counseled to quit smoking Past surgical hx, Family hx, Social hx  all reviewed.  No current outpatient medications on file prior to visit.   No current facility-administered medications on file prior to visit.     Allergies  Allergen Reactions   Penicillins     Review Of Systems:  Constitutional:   No  weight loss, night sweats,  Fevers, chills, fatigue, or  lassitude.  HEENT:   No headaches,  Difficulty swallowing,  Tooth/dental problems, or  Sore throat,                No sneezing, itching, ear ache, nasal congestion, post nasal drip,   CV:  No chest pain,  Orthopnea, PND, swelling in lower extremities, anasarca, dizziness, palpitations, syncope.   GI  No heartburn, indigestion, abdominal pain, nausea, vomiting, diarrhea, change in bowel habits, loss of appetite, bloody stools.   Resp: + shortness of breath with exertion or at rest.  No excess mucus, no productive cough,  No non-productive cough,  No coughing up of blood.  No change in color of mucus.  No wheezing.  No chest wall deformity  Skin: no rash or lesions.  GU: no dysuria, change in color of urine, no urgency or frequency.  No flank pain, no hematuria   MS:  No joint pain or swelling.  No decreased range of motion.  No back pain.  Psych:  No change in mood or affect. No depression or anxiety.  No memory loss.   Vital Signs BP 100/64 (BP Location: Right Arm, Cuff Size: Normal)   Pulse 85   Temp (!) 97.3 F (36.3 C) (Oral)   Ht 5\' 5"  (1.651 m)   Wt 118 lb 12.8 oz (53.9 kg)   LMP  (LMP Unknown)   SpO2 (!) 86%   BMI 19.77 kg/m    Physical Exam: General- No distress,  A&O x 3, pleasant ENT: No sinus tenderness, TM clear, pale nasal mucosa, no oral exudate,no post nasal drip, no LAN Cardiac: S1, S2, regular rate and rhythm, no murmur Chest: No wheeze/ rales/ dullness; no accessory muscle use, no nasal flaring, no sternal retractions, diminished per bases.  Abd.: Soft Non-tender, ND, BS +,  Ext: No clubbing cyanosis, edema Neuro:  Frail, awake and alert, oriented x 3 , MAE  x 4 Skin: No rashes, warm and dry, no lesions Psych: normal mood and behavior   Assessment/Plan Acute Respiratory Failure with chronic hypercarbia Home oxygen at 4L [place] New concentrator ordered 8/29 Plan Start Trelegy 1 puff daily ( every day without fail)  Rinse mouth after use Maintain oxygen sats > 88% at all times Plan to walk on POC at next OV to see if patient can tolerate pulsed oxygen >> will need 5 L pulsed PFT's for baseline and to evaluate if surgical candidate Wear oxygen at 4 L with exertion, 3 L at rest.  Continue wearing oxygen at bedtime. Patient needs oxygen qualifying walk.   Multi-focal Pneumonia Treated as inpatient  Plan  Super D CT Chest ordered needs to be scheduled asap>> scheduled 02/02/2021 Continue to monitor/ FU CXR prn  Pleural Effusions  Plan Thoracentesis as inpatient Follow up CXR prn in office  Self monitor for worsening dyspnea , call to be seen    Heart Failure BNP 268 on admission Plan  Trend Consider referral to Cardiology  I spent 40  minutes dedicated to the care of this patient on the date of this encounter to include pre-visit review of records, face-to-face time with the patient discussing conditions above, post visit ordering of testing, clinical documentation with the electronic health record, making appropriate referrals as documented, and communicating necessary information to the patient's healthcare team.    Magdalen Spatz, NP 01/20/2021  3:10 PM

## 2021-01-14 NOTE — Patient Instructions (Addendum)
It is good to see you today We will start Trelegy 1 puff once daily  Rinse mouth after use We will order PFT's . You will get a call to get these scheduled Do not use Trelegy the day of PFT's.  We need to schedule your CT Chest.  We will have the patient co-ordinators to get this scheduled.  Wear oxygen at 4 L with exertion, 3 L at rest.  Continue wearing oxygen at bedtime. Saturation goal is > 88% at all times.  We will walk you on POC at follow up.  Three week follow up with Judson Roch NP, or Dr. Silas Flood after CT Chest Please contact office for sooner follow up if symptoms do not improve or worsen or seek emergency care

## 2021-01-20 ENCOUNTER — Encounter: Payer: Self-pay | Admitting: Acute Care

## 2021-01-26 ENCOUNTER — Ambulatory Visit (INDEPENDENT_AMBULATORY_CARE_PROVIDER_SITE_OTHER): Payer: Medicare Other | Admitting: Pulmonary Disease

## 2021-01-26 ENCOUNTER — Other Ambulatory Visit: Payer: Self-pay

## 2021-01-26 DIAGNOSIS — J9601 Acute respiratory failure with hypoxia: Secondary | ICD-10-CM

## 2021-01-26 DIAGNOSIS — J9602 Acute respiratory failure with hypercapnia: Secondary | ICD-10-CM

## 2021-01-26 LAB — PULMONARY FUNCTION TEST
DL/VA % pred: 97 %
DL/VA: 3.9 ml/min/mmHg/L
DLCO cor % pred: 68 %
DLCO cor: 13.14 ml/min/mmHg
DLCO unc % pred: 78 %
DLCO unc: 15.04 ml/min/mmHg
FEF 25-75 Post: 0.47 L/sec
FEF 25-75 Pre: 0.49 L/sec
FEF2575-%Change-Post: -2 %
FEF2575-%Pred-Post: 37 %
FEF2575-%Pred-Pre: 38 %
FEV1-%Change-Post: -1 %
FEV1-%Pred-Post: 56 %
FEV1-%Pred-Pre: 57 %
FEV1-Post: 1.07 L
FEV1-Pre: 1.09 L
FEV1FVC-%Change-Post: 0 %
FEV1FVC-%Pred-Pre: 76 %
FEV6-%Change-Post: -1 %
FEV6-%Pred-Post: 80 %
FEV6-%Pred-Pre: 81 %
FEV6-Post: 1.93 L
FEV6-Pre: 1.96 L
FEV6FVC-%Pred-Post: 106 %
FEV6FVC-%Pred-Pre: 106 %
FVC-%Change-Post: -1 %
FVC-%Pred-Post: 75 %
FVC-%Pred-Pre: 76 %
FVC-Post: 1.93 L
FVC-Pre: 1.96 L
Post FEV1/FVC ratio: 55 %
Post FEV6/FVC ratio: 100 %
Pre FEV1/FVC ratio: 55 %
Pre FEV6/FVC Ratio: 100 %
RV % pred: 88 %
RV: 2.22 L
TLC % pred: 82 %
TLC: 4.27 L

## 2021-01-26 NOTE — Patient Instructions (Signed)
Full PFT performed today. °

## 2021-01-26 NOTE — Progress Notes (Signed)
Full PFT performed today. °

## 2021-02-02 ENCOUNTER — Other Ambulatory Visit: Payer: Self-pay

## 2021-02-02 ENCOUNTER — Ambulatory Visit
Admission: RE | Admit: 2021-02-02 | Discharge: 2021-02-02 | Disposition: A | Discharge status: Home / Self Care | Payer: Medicare Other | Source: Ambulatory Visit | Attending: Pulmonary Disease | Admitting: Pulmonary Disease

## 2021-02-02 DIAGNOSIS — R911 Solitary pulmonary nodule: Secondary | ICD-10-CM

## 2021-02-03 LAB — FUNGUS CULTURE RESULT

## 2021-02-03 LAB — FUNGUS CULTURE WITH STAIN

## 2021-02-03 LAB — FUNGAL ORGANISM REFLEX

## 2021-02-04 ENCOUNTER — Other Ambulatory Visit: Payer: Self-pay

## 2021-02-04 ENCOUNTER — Ambulatory Visit (INDEPENDENT_AMBULATORY_CARE_PROVIDER_SITE_OTHER): Payer: Medicare Other | Admitting: Acute Care

## 2021-02-04 ENCOUNTER — Encounter: Payer: Self-pay | Admitting: Acute Care

## 2021-02-04 VITALS — BP 112/80 | HR 70 | Temp 98.2°F | Ht 65.0 in | Wt 128.4 lb

## 2021-02-04 DIAGNOSIS — R911 Solitary pulmonary nodule: Secondary | ICD-10-CM | POA: Diagnosis not present

## 2021-02-04 DIAGNOSIS — J441 Chronic obstructive pulmonary disease with (acute) exacerbation: Secondary | ICD-10-CM

## 2021-02-04 MED ORDER — TRELEGY ELLIPTA 200-62.5-25 MCG/INH IN AEPB: 1.0000 | INHALATION_SPRAY | Freq: Every day | RESPIRATORY_TRACT | 0 refills | Status: DC

## 2021-02-04 NOTE — Patient Instructions (Addendum)
It is good to see you today. Continue using Trelegy 1 puff once daily. Rinse mouth after use  use We will give you some samples today. We will send in a prescription  We will schedule a PET scan to better evaluate the areas of concern in your lungs.  You will get a call to schedule this. Follow up with Dr. Silas Flood or Judson Roch NP after PET scan. Continue wearing oxygen at 3 L , saturation goal is > 88% at all times. Follow up after PET scan.  Please contact office for sooner follow up if symptoms do not improve or worsen or seek emergency care

## 2021-02-04 NOTE — Progress Notes (Signed)
History of Present Illness Amy Sandoval is a 86 y.o. female current every day smoker  with a 30 + pack year smoking history, admitted 8/18-8/21/2022 with acute respiratory failure, multifocal pneumonia, pleural effusions , heart failure, COPD and emphysema.  CT scan showed 2 masslike opacities in the right upper lobe, apical area as well as a left lower lobe suspicious looking nodule.  Pt. Was seen as an inpatient  consult by Dr. Erin Fulling.   Admission 8/18-8/21/2022  for acute respiratory failure, multifocal pneumonia, pleural effusions , COPD and emphysema., chronic hypercarbia. She was treated with oxygen, antimicrobial therapy, thoracentesis. ( 450 cc's clear yellow fluid removed) She was discharged home on 4L of oxygen per [place]. She needs a 6 week follow up super D CT Chest to follow up on pulmonary nodules, as there was a mass noted on the CTA she had done as an inpatient.   02/04/2021 Pt. Presents for follow up after CT Chest and PFT's. She states she is doing much better . PFT's confirm obstruction, and moderately reduced DLCO. She is compliant with her Trelegy and has minimal secretions . She has been wearing her oxygen , but states she can take it off when at rest and her sats remain > 90%. She endorses minimal secretions. Pleural effusions and infiltrates have cleared per CT Chest. She has gained 10 pounds since her last visit on 9/14. She is much stronger. She has returned to work. She states she has regained her energy.  We reviewed her CT Chest. I showed both the patient and her daughter that the patient had areas in her lungs, both right and left lobes ,that are concerning for malignancy. I explained that the next step is to do a PET scan. Once PET scan is done, we can determine a plan of care based on results. They are in agreement with this plan.   Test Results: Super D CT Chest 02/02/2021 Spiculated left lower lobe nodule is highly worrisome for primary bronchogenic carcinoma. Mass  like areas of consolidation in the right upper lobe are also worrisome for adenocarcinoma. Consider PET in further evaluation, as clinically indicated. Aortic atherosclerosis (ICD10-I70.0). Coronary artery calcification. Emphysema (ICD10-J43.9).               CBC Latest Ref Rng & Units 12/19/2020 12/18/2020  WBC 4.0 - 10.5 K/uL 5.8 3.5(L)  Hemoglobin 12.0 - 15.0 g/dL 19.3(H) 15.6(H)  Hematocrit 36.0 - 46.0 % 61.9(H) 49.9(H)  Platelets 150 - 400 K/uL 123(L) 120(L)    BMP Latest Ref Rng & Units 12/21/2020 12/19/2020 12/18/2020  Glucose 70 - 99 mg/dL 93 123(H) 80  BUN 8 - 23 mg/dL 14 11 12   Creatinine 0.44 - 1.00 mg/dL 0.55 0.73 0.70  Sodium 135 - 145 mmol/L 140 142 142  Potassium 3.5 - 5.1 mmol/L 4.3 4.7 4.8  Chloride 98 - 111 mmol/L 96(L) 99 97(L)  CO2 22 - 32 mmol/L 37(H) 34(H) 37(H)  Calcium 8.9 - 10.3 mg/dL 8.2(L) 8.5(L) 9.0    BNP    Component Value Date/Time   BNP 268.8 (H) 12/19/2020 1848    ProBNP No results found for: PROBNP  PFT    Component Value Date/Time   FEV1PRE 1.09 01/26/2021 1552   FEV1POST 1.07 01/26/2021 1552   FVCPRE 1.96 01/26/2021 1552   FVCPOST 1.93 01/26/2021 1552   TLC 4.27 01/26/2021 1552   DLCOUNC 15.04 01/26/2021 1552   PREFEV1FVCRT 55 01/26/2021 1552   PSTFEV1FVCRT 55 01/26/2021 1552    CT Super D  Chest Wo Contrast  Result Date: 02/03/2021 CLINICAL DATA:  Right upper lobe nodule.  Current smoker. EXAM: CT CHEST WITHOUT CONTRAST TECHNIQUE: Multidetector CT imaging of the chest was performed using thin slice collimation for electromagnetic bronchoscopy planning purposes, without intravenous contrast. COMPARISON:  12/18/2020. FINDINGS: Cardiovascular: Atherosclerotic calcification of the aorta, aortic valve and coronary arteries. Heart is enlarged. No pericardial effusion. Mediastinum/Nodes: No pathologically enlarged mediastinal or axillary lymph nodes. Hilar regions are difficult to definitively evaluate without IV contrast. Esophagus  is grossly unremarkable. Lungs/Pleura: Masslike areas of consolidation in the apical segment right upper lobe measure 2.9 x 4.4 cm and 1.6 x 2.8 cm, respectively, as on 12/18/2020. Centrilobular emphysema. Left apical pleuroparenchymal scarring. Spiculated left lower lobe nodule measures 1.7 x 1.8 cm (8/81), unchanged. No pleural fluid. Airway is unremarkable. Upper Abdomen: Visualized portions of the liver and right adrenal gland are unremarkable. There may be thickening of the left adrenal gland. Visualized portion of the right kidney is unremarkable. 1.4 cm fluid density lesion in the left kidney, likely a cyst. Visualized portions of the spleen, pancreas, stomach and bowel are grossly unremarkable. Musculoskeletal: Degenerative changes in the spine. No worrisome lytic or sclerotic lesions. IMPRESSION: 1. Spiculated left lower lobe nodule is highly worrisome for primary bronchogenic carcinoma. 2. Masslike areas of consolidation in the right upper lobe are also worrisome for adenocarcinoma. 3. Consider PET in further evaluation, as clinically indicated. 4. Aortic atherosclerosis (ICD10-I70.0). Coronary artery calcification. 5.  Emphysema (ICD10-J43.9). Electronically Signed   By: Lorin Picket M.D.   On: 02/03/2021 14:09     Past medical hx No past medical history on file.   Social History   Tobacco Use   Smoking status: Some Days    Packs/day: 0.10    Years: 60.00    Pack years: 6.00    Types: Cigarettes    Passive exposure: Past   Smokeless tobacco: Never  Vaping Use   Vaping Use: Former   Substances: Nicotine, Flavoring  Substance Use Topics   Alcohol use: Yes    Comment: Social   Drug use: Never    Amy Sandoval reports that she has been smoking cigarettes. She has a 6.00 pack-year smoking history. She has been exposed to tobacco smoke. She has never used smokeless tobacco. She reports current alcohol use. She reports that she does not use drugs.  Tobacco Cessation: Current Every  Day Smoker/ 30 pack year smoker  Past surgical hx, Family hx, Social hx all reviewed.  Current Outpatient Medications on File Prior to Visit  Medication Sig   Fluticasone-Umeclidin-Vilant (TRELEGY ELLIPTA) 200-62.5-25 MCG/INH AEPB Inhale 1 puff into the lungs daily.   No current facility-administered medications on file prior to visit.     Allergies  Allergen Reactions   Penicillins     Review Of Systems:  Constitutional:   No  weight loss, night sweats,  Fevers, chills, fatigue, or  lassitude.  HEENT:   No headaches,  Difficulty swallowing,  Tooth/dental problems, or  Sore throat,                No sneezing, itching, ear ache, nasal congestion, post nasal drip,   CV:  No chest pain,  Orthopnea, PND, swelling in lower extremities, anasarca, dizziness, palpitations, syncope.   GI  No heartburn, indigestion, abdominal pain, nausea, vomiting, diarrhea, change in bowel habits, loss of appetite, bloody stools.   Resp: + shortness of breath with exertion less at rest.  No excess mucus, no productive cough,  No non-productive cough,  No coughing up of blood.  No change in color of mucus.  No wheezing.  No chest wall deformity  Skin: no rash or lesions.  GU: no dysuria, change in color of urine, no urgency or frequency.  No flank pain, no hematuria   MS:  No joint pain or swelling.  No decreased range of motion.  No back pain.  Psych:  No change in mood or affect. No depression or anxiety.  No memory loss.   Vital Signs BP 112/80 (BP Location: Right Arm, Cuff Size: Normal)   Pulse 70   Temp 98.2 F (36.8 C) (Oral)   Ht 5\' 5"  (1.651 m)   Wt 128 lb 6.4 oz (58.2 kg)   LMP  (LMP Unknown)   SpO2 94%   BMI 21.37 kg/m    Physical Exam:  General- No distress,  A&Ox3, pleasant ENT: No sinus tenderness, TM clear, pale nasal mucosa, no oral exudate,no post nasal drip, no LAN Cardiac: S1, S2, regular rate and rhythm, no murmur Chest: No wheeze/ rales/ dullness; few rhonchi, no  accessory muscle use, no nasal flaring, no sternal retractions Abd.: Soft Non-tender, ND, BS +, Body mass index is 21.37 kg/m. Ext: No clubbing cyanosis, edema Neuro:  normal strength, MAE x 4, A&O x 3 Skin: No rashes, warm and dry, no lesions  Psych: normal mood and behavior   Assessment/Plan Pulmonary Mass in Smoker Plan PET scan to evaluate need for tissue sampling Smoking cessation counseling Follow up with Dr. Silas Flood or Judson Roch after PET scan   COPD Plan Continue using Trelegy 1 puff once daily. Rinse mouth after use  use We will give you some samples today. We will send in a prescription   Follow up with Dr. Silas Flood or Judson Roch NP after PET scan.  Hypoxic Respiratory Failure Plan Continue wearing oxygen at 3 L , saturation goal is > 88% at all times. Follow up after PET scan.  Please contact office for sooner follow up if symptoms do not improve or worsen or seek emergency care    Improving as multifocal pneumonia has resolved Follow up in 1 month with Dr. Silas Flood or Judson Roch NP  I spent 40 minutes dedicated to the care of this patient on the date of this encounter to include pre-visit review of records, face-to-face time with the patient discussing conditions above, post visit ordering of testing, clinical documentation with the electronic health record, making appropriate referrals as documented, and communicating necessary information to the patient's healthcare team.      Magdalen Spatz, NP 02/04/2021  3:55 PM

## 2021-02-05 ENCOUNTER — Encounter: Payer: Self-pay | Admitting: Acute Care

## 2021-02-13 LAB — ACID FAST CULTURE WITH REFLEXED SENSITIVITIES (MYCOBACTERIA): Acid Fast Culture: NEGATIVE

## 2021-02-17 ENCOUNTER — Other Ambulatory Visit: Payer: Self-pay

## 2021-02-17 ENCOUNTER — Encounter (HOSPITAL_COMMUNITY)
Admission: RE | Admit: 2021-02-17 | Discharge: 2021-02-17 | Disposition: A | Discharge status: Home / Self Care | Payer: Medicare Other | Source: Ambulatory Visit | Attending: Acute Care | Admitting: Acute Care

## 2021-02-17 DIAGNOSIS — I2510 Atherosclerotic heart disease of native coronary artery without angina pectoris: Secondary | ICD-10-CM | POA: Diagnosis not present

## 2021-02-17 DIAGNOSIS — I700 Atherosclerosis of aorta: Secondary | ICD-10-CM | POA: Diagnosis not present

## 2021-02-17 DIAGNOSIS — K5730 Diverticulosis of large intestine without perforation or abscess without bleeding: Secondary | ICD-10-CM | POA: Insufficient documentation

## 2021-02-17 DIAGNOSIS — R911 Solitary pulmonary nodule: Secondary | ICD-10-CM | POA: Insufficient documentation

## 2021-02-17 DIAGNOSIS — J439 Emphysema, unspecified: Secondary | ICD-10-CM | POA: Diagnosis not present

## 2021-02-17 LAB — GLUCOSE, CAPILLARY: Glucose-Capillary: 76 mg/dL (ref 70–99)

## 2021-02-17 MED ORDER — FLUDEOXYGLUCOSE F - 18 (FDG) INJECTION
6.5000 | Freq: Once | INTRAVENOUS | Status: AC | PRN
  Administered 2021-02-17: 6.42 via INTRAVENOUS

## 2021-02-23 ENCOUNTER — Encounter: Payer: Self-pay | Admitting: Acute Care

## 2021-02-23 ENCOUNTER — Other Ambulatory Visit: Payer: Self-pay

## 2021-02-23 ENCOUNTER — Ambulatory Visit (INDEPENDENT_AMBULATORY_CARE_PROVIDER_SITE_OTHER): Payer: Medicare Other | Admitting: Acute Care

## 2021-02-23 VITALS — BP 118/78 | HR 69 | Temp 98.2°F | Ht 65.0 in | Wt 132.0 lb

## 2021-02-23 DIAGNOSIS — J209 Acute bronchitis, unspecified: Secondary | ICD-10-CM

## 2021-02-23 DIAGNOSIS — J440 Chronic obstructive pulmonary disease with acute lower respiratory infection: Secondary | ICD-10-CM

## 2021-02-23 DIAGNOSIS — F17210 Nicotine dependence, cigarettes, uncomplicated: Secondary | ICD-10-CM

## 2021-02-23 DIAGNOSIS — R911 Solitary pulmonary nodule: Secondary | ICD-10-CM | POA: Diagnosis not present

## 2021-02-23 MED ORDER — TRELEGY ELLIPTA 200-62.5-25 MCG/ACT IN AEPB: 1.0000 | INHALATION_SPRAY | Freq: Every day | RESPIRATORY_TRACT | 6 refills | Status: DC

## 2021-02-23 MED ORDER — TRELEGY ELLIPTA 200-62.5-25 MCG/ACT IN AEPB: 200.0000 ug | INHALATION_SPRAY | Freq: Every day | RESPIRATORY_TRACT | 0 refills | Status: DC

## 2021-02-23 NOTE — H&P (View-Only) (Signed)
History of Present Illness Amy Sandoval is a 86 y.o. female current every day smoker with bilateral PET avid lung nodules, and COPD. She is followed by Dr. Silas Flood.   Synopsis Admission 8/18-8/21/2022  for acute respiratory failure, multifocal pneumonia, pleural effusions , COPD and emphysema., chronic hypercarbia. She was treated with oxygen, antimicrobial therapy, thoracentesis. ( 450 cc's clear yellow fluid removed) She was discharged home on 4L of oxygen per Lamb. She needs a 6 week follow up super D CT Chest to follow up on pulmonary nodules, as there was a mass noted on the CTA she had done as an inpatient.   02/23/2021 Pt. Presents for follow up after PET scan. PET shows PET avid areas that need tissue sampling. She is in agreement with tissue sampling. She has been doing well. She is continuing to work..She runs a pool supply company.  She is wearing her oxygen whenever her sat level drops below 90 % . She feels she has recovered from her pneumonia. She has had weight gain since leaving the hospital as her appetite has improved.   Test Results: PET 02/18/2021 The 2.0 by 1.6 cm left lower lobe nodule has a maximum SUV of 6.3, high suspicion for malignancy. The medial nodular portion of the more posterior of the right upper lobe mass is has a maximum SUV of 7.0, high suspicion for malignancy. The less nodular and more lateral component of this lesion has a maximum SUV of 2.4. The more anterior of the right apical masses, has maximum SUV of 2.8, still suspicious for potential malignancy given the morphology. No current hypermetabolic adenopathy or distant metastatic lesions identified. Other imaging findings of potential clinical significance: Aortic Atherosclerosis (ICD10-I70.0) and Emphysema (ICD10-J43.9). Coronary atherosclerosis. Infrarenal left-sided IVC, a venous variant. Sigmoid colon diverticulosis.  02/02/2021 Super D CT Chest Spiculated left lower lobe nodule is  highly worrisome for primary bronchogenic carcinoma. Mass like areas of consolidation in the right upper lobe are also worrisome for adenocarcinoma. Consider PET in further evaluation, as clinically indicated. Aortic atherosclerosis (ICD10-I70.0). Coronary artery calcification. Emphysema (ICD10-J43.9).   CBC Latest Ref Rng & Units 12/19/2020 12/18/2020  WBC 4.0 - 10.5 K/uL 5.8 3.5(L)  Hemoglobin 12.0 - 15.0 g/dL 19.3(H) 15.6(H)  Hematocrit 36.0 - 46.0 % 61.9(H) 49.9(H)  Platelets 150 - 400 K/uL 123(L) 120(L)    BMP Latest Ref Rng & Units 12/21/2020 12/19/2020 12/18/2020  Glucose 70 - 99 mg/dL 93 123(H) 80  BUN 8 - 23 mg/dL 14 11 12   Creatinine 0.44 - 1.00 mg/dL 0.55 0.73 0.70  Sodium 135 - 145 mmol/L 140 142 142  Potassium 3.5 - 5.1 mmol/L 4.3 4.7 4.8  Chloride 98 - 111 mmol/L 96(L) 99 97(L)  CO2 22 - 32 mmol/L 37(H) 34(H) 37(H)  Calcium 8.9 - 10.3 mg/dL 8.2(L) 8.5(L) 9.0    BNP    Component Value Date/Time   BNP 268.8 (H) 12/19/2020 1848    ProBNP No results found for: PROBNP  PFT    Component Value Date/Time   FEV1PRE 1.09 01/26/2021 1552   FEV1POST 1.07 01/26/2021 1552   FVCPRE 1.96 01/26/2021 1552   FVCPOST 1.93 01/26/2021 1552   TLC 4.27 01/26/2021 1552   DLCOUNC 15.04 01/26/2021 1552   PREFEV1FVCRT 55 01/26/2021 1552   PSTFEV1FVCRT 55 01/26/2021 1552    NM PET Image Initial (PI) Skull Base To Thigh  Result Date: 02/18/2021 CLINICAL DATA:  Initial treatment strategy for lung nodule. EXAM: NUCLEAR MEDICINE PET SKULL BASE TO THIGH TECHNIQUE: 6.4 mCi  F-18 FDG was injected intravenously. Full-ring PET imaging was performed from the skull base to thigh after the radiotracer. CT data was obtained and used for attenuation correction and anatomic localization. Fasting blood glucose: 76 mg/dl COMPARISON:  Chest CT 02/02/2021 FINDINGS: Mediastinal blood pool activity: SUV max 2.3 Liver activity: SUV max NA NECK: No significant abnormal hypermetabolic activity in this  region. Incidental CT findings: Bilateral common carotid atherosclerotic calcification. CHEST: The anterior mass in the right lung apex measuring 4.2 by 2.9 cm on image 11 series 8 has a maximum SUV of 2.8. The medial nodular portion of the posterior mass in the apical segment right upper lobe measures 1.8 by 1.5 cm on image 15 of series 8 and has maximum SUV of 7.0. The more posterior portion of this lesion measures 2.1 by 1.0 cm and this posterior portion has a maximum SUV of 2.4. The 2.0 by 1.6 cm nodule in the left lower lobe on image 38 series 8 has a maximum SUV of 6.3. Linear nodularity laterally at the right lung base for example on image 51 of series 8 is probably a small mucocele and not appreciably hypermetabolic although is below sensitive PET-CT size thresholds. No appreciable hypermetabolic adenopathy in the chest. Incidental CT findings: Centrilobular emphysema. Coronary, aortic arch, and branch vessel atherosclerotic vascular disease. Moderate cardiomegaly. ABDOMEN/PELVIS: No significant abnormal hypermetabolic activity in this region. Incidental CT findings: Atherosclerosis is present, including aortoiliac atherosclerotic disease. Left-sided infrarenal IVC, a venous variant. Sigmoid colon diverticulosis. SKELETON: No significant abnormal hypermetabolic activity in this region. Incidental CT findings: none IMPRESSION: 1. The 2.0 by 1.6 cm left lower lobe nodule has a maximum SUV of 6.3, high suspicion for malignancy. 2. The medial nodular portion of the more posterior of the right upper lobe mass is has a maximum SUV of 7.0, high suspicion for malignancy. The less nodular and more lateral component of this lesion has a maximum SUV of 2.4. 3. The more anterior of the right apical masses, has maximum SUV of 2.8, still suspicious for potential malignancy given the morphology. 4. No current hypermetabolic adenopathy or distant metastatic lesions identified. 5. Other imaging findings of potential clinical  significance: Aortic Atherosclerosis (ICD10-I70.0) and Emphysema (ICD10-J43.9). Coronary atherosclerosis. Infrarenal left-sided IVC, a venous variant. Sigmoid colon diverticulosis. Electronically Signed   By: Van Clines M.D.   On: 02/18/2021 15:08   CT Super D Chest Wo Contrast  Result Date: 02/03/2021 CLINICAL DATA:  Right upper lobe nodule.  Current smoker. EXAM: CT CHEST WITHOUT CONTRAST TECHNIQUE: Multidetector CT imaging of the chest was performed using thin slice collimation for electromagnetic bronchoscopy planning purposes, without intravenous contrast. COMPARISON:  12/18/2020. FINDINGS: Cardiovascular: Atherosclerotic calcification of the aorta, aortic valve and coronary arteries. Heart is enlarged. No pericardial effusion. Mediastinum/Nodes: No pathologically enlarged mediastinal or axillary lymph nodes. Hilar regions are difficult to definitively evaluate without IV contrast. Esophagus is grossly unremarkable. Lungs/Pleura: Masslike areas of consolidation in the apical segment right upper lobe measure 2.9 x 4.4 cm and 1.6 x 2.8 cm, respectively, as on 12/18/2020. Centrilobular emphysema. Left apical pleuroparenchymal scarring. Spiculated left lower lobe nodule measures 1.7 x 1.8 cm (8/81), unchanged. No pleural fluid. Airway is unremarkable. Upper Abdomen: Visualized portions of the liver and right adrenal gland are unremarkable. There may be thickening of the left adrenal gland. Visualized portion of the right kidney is unremarkable. 1.4 cm fluid density lesion in the left kidney, likely a cyst. Visualized portions of the spleen, pancreas, stomach and bowel are grossly unremarkable.  Musculoskeletal: Degenerative changes in the spine. No worrisome lytic or sclerotic lesions. IMPRESSION: 1. Spiculated left lower lobe nodule is highly worrisome for primary bronchogenic carcinoma. 2. Masslike areas of consolidation in the right upper lobe are also worrisome for adenocarcinoma. 3. Consider PET in  further evaluation, as clinically indicated. 4. Aortic atherosclerosis (ICD10-I70.0). Coronary artery calcification. 5.  Emphysema (ICD10-J43.9). Electronically Signed   By: Lorin Picket M.D.   On: 02/03/2021 14:09     Past medical hx No past medical history on file.   Social History   Tobacco Use   Smoking status: Some Days    Packs/day: 0.10    Years: 60.00    Pack years: 6.00    Types: Cigarettes    Passive exposure: Past   Smokeless tobacco: Never  Vaping Use   Vaping Use: Former   Substances: Nicotine, Flavoring  Substance Use Topics   Alcohol use: Yes    Comment: Social   Drug use: Never    Amy Sandoval reports that she has been smoking cigarettes. She has a 6.00 pack-year smoking history. She has been exposed to tobacco smoke. She has never used smokeless tobacco. She reports current alcohol use. She reports that she does not use drugs.  Tobacco Cessation: Current Every day smoker Counseling done  Past surgical hx, Family hx, Social hx all reviewed.  Current Outpatient Medications on File Prior to Visit  Medication Sig   Fluticasone-Umeclidin-Vilant (TRELEGY ELLIPTA) 200-62.5-25 MCG/INH AEPB Inhale 1 puff into the lungs daily.   Fluticasone-Umeclidin-Vilant (TRELEGY ELLIPTA) 200-62.5-25 MCG/INH AEPB Inhale 1 puff into the lungs daily.   No current facility-administered medications on file prior to visit.     Allergies  Allergen Reactions   Penicillins     Review Of Systems:  Constitutional:   No  weight loss, night sweats,  Fevers, chills, fatigue, or  lassitude.  HEENT:   No headaches,  Difficulty swallowing,  Tooth/dental problems, or  Sore throat,                No sneezing, itching, ear ache, nasal congestion, post nasal drip,   CV:  No chest pain,  Orthopnea, PND, swelling in lower extremities, anasarca, dizziness, palpitations, syncope.   GI  No heartburn, indigestion, abdominal pain, nausea, vomiting, diarrhea, change in bowel habits, loss of  appetite, bloody stools.   Resp: + shortness of breath with exertion or at rest.  No excess mucus, no productive cough,  No non-productive cough,  No coughing up of blood.  No change in color of mucus.  No wheezing.  No chest wall deformity  Skin: no rash or lesions.  GU: no dysuria, change in color of urine, no urgency or frequency.  No flank pain, no hematuria   MS:  No joint pain or swelling.  No decreased range of motion.  No back pain.  Psych:  No change in mood or affect. No depression or anxiety.  No memory loss.   Vital Signs BP 118/78 (BP Location: Left Arm, Cuff Size: Normal)   Pulse 69   Temp 98.2 F (36.8 C) (Oral)   Ht 5\' 5"  (1.651 m)   Wt 132 lb (59.9 kg)   LMP  (LMP Unknown)   SpO2 95%   BMI 21.97 kg/m    Physical Exam:  General- No distress,  A&O x 3, pleasant  ENT: No sinus tenderness, TM clear, pale nasal mucosa, no oral exudate,no post nasal drip, no LAN Cardiac: S1, S2, regular rate and rhythm, no murmur Chest:  No wheeze/ rales/ dullness; no accessory muscle use, no nasal flaring, no sternal retractions Abd.: Soft Non-tender, ND, NT, BS +, Body mass index is 21.97 kg/m. Ext: No clubbing cyanosis, edema Neuro:  normal strength, MAE x 4, A&O x 3 Skin: No rashes, warm and dry, no lesions Psych: normal mood and behavior   Assessment/Plan PET Avid Lung Nodule in smoker COPD Plan We will get you scheduled with Dr. Valeta Harms or Dr. Lamonte Sakai to do some tissue sampling of the areas of concern that we saw on the PET Scan. We will call you today to get this scheduled.  Continue using your trelegy 1 puff daily . Rinse mouth after use.  Continue using oxygen at bedtime and as needed through the day to keep oxygen saturations > 90% We will request additional oxygen tubing for your tanks. Call if they do not get in touch with you soon.  Follow up after tissue sampling with Judson Roch NP  Please contact office for sooner follow up if symptoms do not improve or worsen or  seek emergency care    I spent 45 minutes dedicated to the care of this patient on the date of this encounter to include pre-visit review of records, face-to-face time with the patient discussing conditions above, post visit ordering of testing, clinical documentation with the electronic health record, making appropriate referrals as documented, and communicating necessary information to the patient's healthcare team.    Magdalen Spatz, NP 02/23/2021  9:46 AM

## 2021-02-23 NOTE — Progress Notes (Signed)
PCCM:  86 year old female, very functional baseline.  Past medical history of tobacco abuse.  Recent CT imaging with bilateral nodules.  Nuclear medicine pet imaging reviewed today in the office with patient and Eric Form, NP.  Patient has a 2 x 1.6 cm left lower lobe nodule with an SUV max of 6.3 suspicious for malignancy.  Additionally there is a medial nodule within the right upper lobe with an SUV of 7 suspicious for malignancy.  There is a less nodule more lateral component of this.  There is in her right apical mass that is suspicious for malignancy by morphology but has a lower level SUV.  No other distant metastatic disease.  BP 118/78 (BP Location: Left Arm, Cuff Size: Normal)   Pulse 69   Temp 98.2 F (36.8 C) (Oral)   Ht 5\' 5"  (1.651 m)   Wt 132 lb (59.9 kg)   LMP  (LMP Unknown)   SpO2 95%   BMI 21.97 kg/m   General: Elderly female, no acute distress HEENT: Tracking appropriately Heart: Regular rhythm S1-S2 lungs: Clear to auscultation bilaterally Extremities: No significant edema  Imaging: Reviewed as stated above.  Assessment: Hypermetabolic pulmonary lesions, multiple pulmonary nodules Concerning for metachronous primary bronchogenic carcinomas. Tobacco abuse  Plan: Discussed risk benefits and alternatives of with patient in the office regarding bronchoscopy. We will plan for robotic assisted bronchoscopy with either myself or Dr. Lamonte Sakai in the coming weeks. Patient is agreeable to this plan.  I agree with the documentation per Eric Form, NP.  [facility] 02/23/2021 5:30 PM

## 2021-02-23 NOTE — Patient Instructions (Addendum)
It is good to see you today. We will get you scheduled with Dr. Valeta Harms or Dr. Lamonte Sakai to do some tissue sampling of the areas of concern that we saw on the PET Scan. We will call you today to get this scheduled.  Continue using your trelegy 1 puff daily . Rinse mouth after use.  Continue using oxygen at bedtime and as needed through the day to keep oxygen saturations > 90% We will request additional oxygen tubing for your tanks. Call if they do not get in touch with you soon.  Follow up after tissue sampling with Judson Roch NP  Please contact office for sooner follow up if symptoms do not improve or worsen or seek emergency care    Call either work number or what is listed as home number ( actually her cell number) to schedule. )

## 2021-02-23 NOTE — Addendum Note (Signed)
Addended by: Garner Nash on: 02/23/2021 05:38 PM   Modules accepted: Orders

## 2021-02-23 NOTE — Progress Notes (Signed)
History of Present Illness Amy Sandoval is a 87 y.o. female current every day smoker with bilateral PET avid lung nodules, and COPD. She is followed by Dr. Silas Flood.   Synopsis Admission 8/18-8/21/2022  for acute respiratory failure, multifocal pneumonia, pleural effusions , COPD and emphysema., chronic hypercarbia. She was treated with oxygen, antimicrobial therapy, thoracentesis. ( 450 cc's clear yellow fluid removed) She was discharged home on 4L of oxygen per [place]. She needs a 6 week follow up super D CT Chest to follow up on pulmonary nodules, as there was a mass noted on the CTA she had done as an inpatient.   02/23/2021 Pt. Presents for follow up after PET scan. PET shows PET avid areas that need tissue sampling. She is in agreement with tissue sampling. She has been doing well. She is continuing to work..She runs a pool supply company.  She is wearing her oxygen whenever her sat level drops below 90 % . She feels she has recovered from her pneumonia. She has had weight gain since leaving the hospital as her appetite has improved.   Test Results: PET 02/18/2021 The 2.0 by 1.6 cm left lower lobe nodule has a maximum SUV of 6.3, high suspicion for malignancy. The medial nodular portion of the more posterior of the right upper lobe mass is has a maximum SUV of 7.0, high suspicion for malignancy. The less nodular and more lateral component of this lesion has a maximum SUV of 2.4. The more anterior of the right apical masses, has maximum SUV of 2.8, still suspicious for potential malignancy given the morphology. No current hypermetabolic adenopathy or distant metastatic lesions identified. Other imaging findings of potential clinical significance: Aortic Atherosclerosis (ICD10-I70.0) and Emphysema (ICD10-J43.9). Coronary atherosclerosis. Infrarenal left-sided IVC, a venous variant. Sigmoid colon diverticulosis.  02/02/2021 Super D CT Chest Spiculated left lower lobe nodule is  highly worrisome for primary bronchogenic carcinoma. Mass like areas of consolidation in the right upper lobe are also worrisome for adenocarcinoma. Consider PET in further evaluation, as clinically indicated. Aortic atherosclerosis (ICD10-I70.0). Coronary artery calcification. Emphysema (ICD10-J43.9).   CBC Latest Ref Rng & Units 12/19/2020 12/18/2020  WBC 4.0 - 10.5 K/uL 5.8 3.5(L)  Hemoglobin 12.0 - 15.0 g/dL 19.3(H) 15.6(H)  Hematocrit 36.0 - 46.0 % 61.9(H) 49.9(H)  Platelets 150 - 400 K/uL 123(L) 120(L)    BMP Latest Ref Rng & Units 12/21/2020 12/19/2020 12/18/2020  Glucose 70 - 99 mg/dL 93 123(H) 80  BUN 8 - 23 mg/dL 14 11 12   Creatinine 0.44 - 1.00 mg/dL 0.55 0.73 0.70  Sodium 135 - 145 mmol/L 140 142 142  Potassium 3.5 - 5.1 mmol/L 4.3 4.7 4.8  Chloride 98 - 111 mmol/L 96(L) 99 97(L)  CO2 22 - 32 mmol/L 37(H) 34(H) 37(H)  Calcium 8.9 - 10.3 mg/dL 8.2(L) 8.5(L) 9.0    BNP    Component Value Date/Time   BNP 268.8 (H) 12/19/2020 1848    ProBNP No results found for: PROBNP  PFT    Component Value Date/Time   FEV1PRE 1.09 01/26/2021 1552   FEV1POST 1.07 01/26/2021 1552   FVCPRE 1.96 01/26/2021 1552   FVCPOST 1.93 01/26/2021 1552   TLC 4.27 01/26/2021 1552   DLCOUNC 15.04 01/26/2021 1552   PREFEV1FVCRT 55 01/26/2021 1552   PSTFEV1FVCRT 55 01/26/2021 1552    NM PET Image Initial (PI) Skull Base To Thigh  Result Date: 02/18/2021 CLINICAL DATA:  Initial treatment strategy for lung nodule. EXAM: NUCLEAR MEDICINE PET SKULL BASE TO THIGH TECHNIQUE: 6.4 mCi  F-18 FDG was injected intravenously. Full-ring PET imaging was performed from the skull base to thigh after the radiotracer. CT data was obtained and used for attenuation correction and anatomic localization. Fasting blood glucose: 76 mg/dl COMPARISON:  Chest CT 02/02/2021 FINDINGS: Mediastinal blood pool activity: SUV max 2.3 Liver activity: SUV max NA NECK: No significant abnormal hypermetabolic activity in this  region. Incidental CT findings: Bilateral common carotid atherosclerotic calcification. CHEST: The anterior mass in the right lung apex measuring 4.2 by 2.9 cm on image 11 series 8 has a maximum SUV of 2.8. The medial nodular portion of the posterior mass in the apical segment right upper lobe measures 1.8 by 1.5 cm on image 15 of series 8 and has maximum SUV of 7.0. The more posterior portion of this lesion measures 2.1 by 1.0 cm and this posterior portion has a maximum SUV of 2.4. The 2.0 by 1.6 cm nodule in the left lower lobe on image 38 series 8 has a maximum SUV of 6.3. Linear nodularity laterally at the right lung base for example on image 51 of series 8 is probably a small mucocele and not appreciably hypermetabolic although is below sensitive PET-CT size thresholds. No appreciable hypermetabolic adenopathy in the chest. Incidental CT findings: Centrilobular emphysema. Coronary, aortic arch, and branch vessel atherosclerotic vascular disease. Moderate cardiomegaly. ABDOMEN/PELVIS: No significant abnormal hypermetabolic activity in this region. Incidental CT findings: Atherosclerosis is present, including aortoiliac atherosclerotic disease. Left-sided infrarenal IVC, a venous variant. Sigmoid colon diverticulosis. SKELETON: No significant abnormal hypermetabolic activity in this region. Incidental CT findings: none IMPRESSION: 1. The 2.0 by 1.6 cm left lower lobe nodule has a maximum SUV of 6.3, high suspicion for malignancy. 2. The medial nodular portion of the more posterior of the right upper lobe mass is has a maximum SUV of 7.0, high suspicion for malignancy. The less nodular and more lateral component of this lesion has a maximum SUV of 2.4. 3. The more anterior of the right apical masses, has maximum SUV of 2.8, still suspicious for potential malignancy given the morphology. 4. No current hypermetabolic adenopathy or distant metastatic lesions identified. 5. Other imaging findings of potential clinical  significance: Aortic Atherosclerosis (ICD10-I70.0) and Emphysema (ICD10-J43.9). Coronary atherosclerosis. Infrarenal left-sided IVC, a venous variant. Sigmoid colon diverticulosis. Electronically Signed   By: Van Clines M.D.   On: 02/18/2021 15:08   CT Super D Chest Wo Contrast  Result Date: 02/03/2021 CLINICAL DATA:  Right upper lobe nodule.  Current smoker. EXAM: CT CHEST WITHOUT CONTRAST TECHNIQUE: Multidetector CT imaging of the chest was performed using thin slice collimation for electromagnetic bronchoscopy planning purposes, without intravenous contrast. COMPARISON:  12/18/2020. FINDINGS: Cardiovascular: Atherosclerotic calcification of the aorta, aortic valve and coronary arteries. Heart is enlarged. No pericardial effusion. Mediastinum/Nodes: No pathologically enlarged mediastinal or axillary lymph nodes. Hilar regions are difficult to definitively evaluate without IV contrast. Esophagus is grossly unremarkable. Lungs/Pleura: Masslike areas of consolidation in the apical segment right upper lobe measure 2.9 x 4.4 cm and 1.6 x 2.8 cm, respectively, as on 12/18/2020. Centrilobular emphysema. Left apical pleuroparenchymal scarring. Spiculated left lower lobe nodule measures 1.7 x 1.8 cm (8/81), unchanged. No pleural fluid. Airway is unremarkable. Upper Abdomen: Visualized portions of the liver and right adrenal gland are unremarkable. There may be thickening of the left adrenal gland. Visualized portion of the right kidney is unremarkable. 1.4 cm fluid density lesion in the left kidney, likely a cyst. Visualized portions of the spleen, pancreas, stomach and bowel are grossly unremarkable.  Musculoskeletal: Degenerative changes in the spine. No worrisome lytic or sclerotic lesions. IMPRESSION: 1. Spiculated left lower lobe nodule is highly worrisome for primary bronchogenic carcinoma. 2. Masslike areas of consolidation in the right upper lobe are also worrisome for adenocarcinoma. 3. Consider PET in  further evaluation, as clinically indicated. 4. Aortic atherosclerosis (ICD10-I70.0). Coronary artery calcification. 5.  Emphysema (ICD10-J43.9). Electronically Signed   By: Lorin Picket M.D.   On: 02/03/2021 14:09     Past medical hx No past medical history on file.   Social History   Tobacco Use   Smoking status: Some Days    Packs/day: 0.10    Years: 60.00    Pack years: 6.00    Types: Cigarettes    Passive exposure: Past   Smokeless tobacco: Never  Vaping Use   Vaping Use: Former   Substances: Nicotine, Flavoring  Substance Use Topics   Alcohol use: Yes    Comment: Social   Drug use: Never    Ms.Coulston reports that she has been smoking cigarettes. She has a 6.00 pack-year smoking history. She has been exposed to tobacco smoke. She has never used smokeless tobacco. She reports current alcohol use. She reports that she does not use drugs.  Tobacco Cessation: Current Every day smoker Counseling done  Past surgical hx, Family hx, Social hx all reviewed.  Current Outpatient Medications on File Prior to Visit  Medication Sig   Fluticasone-Umeclidin-Vilant (TRELEGY ELLIPTA) 200-62.5-25 MCG/INH AEPB Inhale 1 puff into the lungs daily.   Fluticasone-Umeclidin-Vilant (TRELEGY ELLIPTA) 200-62.5-25 MCG/INH AEPB Inhale 1 puff into the lungs daily.   No current facility-administered medications on file prior to visit.     Allergies  Allergen Reactions   Penicillins     Review Of Systems:  Constitutional:   No  weight loss, night sweats,  Fevers, chills, fatigue, or  lassitude.  HEENT:   No headaches,  Difficulty swallowing,  Tooth/dental problems, or  Sore throat,                No sneezing, itching, ear ache, nasal congestion, post nasal drip,   CV:  No chest pain,  Orthopnea, PND, swelling in lower extremities, anasarca, dizziness, palpitations, syncope.   GI  No heartburn, indigestion, abdominal pain, nausea, vomiting, diarrhea, change in bowel habits, loss of  appetite, bloody stools.   Resp: + shortness of breath with exertion or at rest.  No excess mucus, no productive cough,  No non-productive cough,  No coughing up of blood.  No change in color of mucus.  No wheezing.  No chest wall deformity  Skin: no rash or lesions.  GU: no dysuria, change in color of urine, no urgency or frequency.  No flank pain, no hematuria   MS:  No joint pain or swelling.  No decreased range of motion.  No back pain.  Psych:  No change in mood or affect. No depression or anxiety.  No memory loss.   Vital Signs BP 118/78 (BP Location: Left Arm, Cuff Size: Normal)   Pulse 69   Temp 98.2 F (36.8 C) (Oral)   Ht 5\' 5"  (1.651 m)   Wt 132 lb (59.9 kg)   LMP  (LMP Unknown)   SpO2 95%   BMI 21.97 kg/m    Physical Exam:  General- No distress,  A&O x 3, pleasant  ENT: No sinus tenderness, TM clear, pale nasal mucosa, no oral exudate,no post nasal drip, no LAN Cardiac: S1, S2, regular rate and rhythm, no murmur Chest:  No wheeze/ rales/ dullness; no accessory muscle use, no nasal flaring, no sternal retractions Abd.: Soft Non-tender, ND, NT, BS +, Body mass index is 21.97 kg/m. Ext: No clubbing cyanosis, edema Neuro:  normal strength, MAE x 4, A&O x 3 Skin: No rashes, warm and dry, no lesions Psych: normal mood and behavior   Assessment/Plan PET Avid Lung Nodule in smoker COPD Plan We will get you scheduled with Dr. Valeta Harms or Dr. Lamonte Sakai to do some tissue sampling of the areas of concern that we saw on the PET Scan. We will call you today to get this scheduled.  Continue using your trelegy 1 puff daily . Rinse mouth after use.  Continue using oxygen at bedtime and as needed through the day to keep oxygen saturations > 90% We will request additional oxygen tubing for your tanks. Call if they do not get in touch with you soon.  Follow up after tissue sampling with Judson Roch NP  Please contact office for sooner follow up if symptoms do not improve or worsen or  seek emergency care    I spent 45 minutes dedicated to the care of this patient on the date of this encounter to include pre-visit review of records, face-to-face time with the patient discussing conditions above, post visit ordering of testing, clinical documentation with the electronic health record, making appropriate referrals as documented, and communicating necessary information to the patient's healthcare team.    Magdalen Spatz, NP 02/23/2021  9:46 AM

## 2021-02-24 ENCOUNTER — Telehealth: Payer: Self-pay | Admitting: Emergency Medicine

## 2021-02-24 NOTE — Telephone Encounter (Signed)
The following has been scheduled & pt has been made aware:  COVID test 03/06/21 between 8 AM & 4 PM ENB 11/7 @ 9:50 pt to check in by 7:20 AM @ [facility]  Requested CT from [place] - spoke w/ Anderson Malta who is to send disk to me & I will forward to [facility], Attn:  Vista Lawman

## 2021-02-26 NOTE — Progress Notes (Signed)
These results have been discussed with the patient . She is in agreement with plan for robotic bronchoscopy. This will get scheduled, planned as soon as possible for tissue typing.

## 2021-02-27 NOTE — Telephone Encounter (Signed)
I have sent the disk to [facility] @ [facility] via inner office mail.

## 2021-03-06 ENCOUNTER — Encounter (HOSPITAL_COMMUNITY): Payer: Self-pay | Admitting: Emergency Medicine

## 2021-03-06 ENCOUNTER — Other Ambulatory Visit: Payer: Self-pay | Admitting: Pulmonary Disease

## 2021-03-06 ENCOUNTER — Other Ambulatory Visit: Payer: Self-pay

## 2021-03-06 LAB — SARS CORONAVIRUS 2 (TAT 6-24 HRS): SARS Coronavirus 2: NEGATIVE

## 2021-03-06 NOTE — Anesthesia Preprocedure Evaluation (Addendum)
Anesthesia Evaluation  Patient identified by MRN, date of birth, ID band Patient awake    Reviewed: Allergy & Precautions, NPO status , Patient's Chart, lab work & pertinent test results  Airway Mallampati: III  TM Distance: >3 FB Neck ROM: Full  Mouth opening: Limited Mouth Opening  Dental no notable dental hx. (+) Teeth Intact, Dental Advisory Given   Pulmonary shortness of breath, COPD,  oxygen dependent, Current Smoker and Patient abstained from smoking.,    Pulmonary exam normal breath sounds clear to auscultation       Cardiovascular +CHF  Normal cardiovascular exam Rhythm:Regular Rate:Normal  EKG: 12/18/20: Sinus rhythm Biatrial enlargement Left anterior fascicular block Right ventricular hypertrophy Nonspecific T abnormalities, anterior leads No old tracing to compare Confirmed by Aletta Edouard [phone]) on 12/18/2020 12:26:27 PM   CV: Echo 12/19/20: IMPRESSIONS  1. Left ventricular ejection fraction, by estimation, is 60 to 65%. The  left ventricle has normal function. The left ventricle has no regional  wall motion abnormalities. Left ventricular diastolic parameters are  consistent with Grade II diastolic  dysfunction (pseudonormalization).  2. Right ventricular systolic function is normal. The right ventricular  size is moderately enlarged. D-shaped interventricular septum suggesting  RV pressure/volume overload. There is mildly elevated pulmonary artery  systolic pressure. The estimated  right ventricular systolic pressure is 22.6 mmHg.  3. Right atrial size was mildly dilated.  4. The mitral valve is normal in structure. No evidence of mitral valve  regurgitation. No evidence of mitral stenosis.  5. The aortic valve is tricuspid. Aortic valve regurgitation is not  visualized. Mild aortic valve sclerosis is present, with no evidence of  aortic valve stenosis.  6. The inferior vena cava is normal in  size with greater than 50%  respiratory variability, suggesting right atrial pressure of 3 mmHg.     Neuro/Psych negative neurological ROS  negative psych ROS   GI/Hepatic Neg liver ROS, GERD  Controlled,  Endo/Other  negative endocrine ROS  Renal/GU negative Renal ROS  negative genitourinary   Musculoskeletal negative musculoskeletal ROS (+)   Abdominal   Peds  Hematology negative hematology ROS (+)   Anesthesia Other Findings She had LLL and RUL lung nodules, emphysema, coronary calcifications, infrarenal left-sided IVC (venous variant), and sigmoid diverticulosis on 02/17/21 PET scan  Reproductive/Obstetrics                           Anesthesia Physical Anesthesia Plan  ASA: 3  Anesthesia Plan: General   Post-op Pain Management:    Induction: Intravenous  PONV Risk Score and Plan: 2 and Dexamethasone and Ondansetron  Airway Management Planned: Oral ETT  Additional Equipment:   Intra-op Plan:   Post-operative Plan: Extubation in OR  Informed Consent: I have reviewed the patients History and Physical, chart, labs and discussed the procedure including the risks, benefits and alternatives for the proposed anesthesia with the patient or authorized representative who has indicated his/her understanding and acceptance.     Dental advisory given  Plan Discussed with: CRNA  Anesthesia Plan Comments: (PAT note written 03/06/2021 by Myra Gianotti, PA-C. )       Anesthesia Quick Evaluation

## 2021-03-06 NOTE — Progress Notes (Signed)
Anesthesia Chart Review: SAME DAY WORK-UP  Case: [id number] Date/Time: 03/09/21 0945   Procedure: VIDEO BRONCHOSCOPY WITH ENDOBRONCHIAL NAVIGATION (Bilateral) - ION w/ possible fiducial   Anesthesia type: General   Diagnosis: Lung nodule [R91.1]   Pre-op diagnosis: multiple pulmonary nodules   Location: [facility] ROOM 3 / [facility]   Surgeons: Collene Gobble, MD       DISCUSSION: Patient is an 86 year old female scheduled for the above procedure.  History includes smoking, COPD, dyspnea, pneumonia, GERD. She had mild thrombocytopenia, mildly elevated TSH (plan to repeat out-patient), and polycythemia (felt related to smoking, JAK2 mutation negative 12/20/20) on 12/2020 labs. She had LLL and RUL lung nodules, emphysema, coronary calcifications, infrarenal left-sided IVC (venous variant), and sigmoid diverticulosis on 02/17/21 PET scan.   - [place] admission 12/18/20-12/21/20 for acute respiratory failure with hypoxia and hypercapnia secondary to multifocal pneumonia with small pleural effusions and underlying emphysema and smoking history.  Required 4L O2/[place].  Started on empiric antibiotics.  COVID test negative. CTA negative for PE, but large nodular opacities RUL and LLL, consider multifocal infectious process versus primary lung neoplasm. S/p right thoracentesis 12/19/20 (no growth, AFB negative, no fungus, yeast or mold).  Echo showed normal LVEF, grade II DD, RVSP 39.5 mmHg. Pulmonology consulted for pulmonary opacities. Recommended complete course of antibiotics with six week follow-up. If infiltrates persist the consider bronchoscopy. She was discharged home on 4 L home O2 per nasal cannula.  Per 02/23/21 pulmonology follow-up, patient has recovered from her PNA. Wearing O2 at night as as needed when O2 sat < 90% (patient says using 2L as needed). October CT and PET scan findings concerning for LLL and RUL malignant lesions. No hypermetabolic adenopathy or distant metastatic lesions  noted on PET scan. She was seen by June Leap, DO with above procedure recommended by him or Dr. Lamonte Sakai.  Presurgical COVID-19 test is scheduled for 03/06/21. She is a same day work-up, labs and anesthesia team evaluation on the day of surgery.    VS: LMP  (LMP Unknown)  BP Readings from Last 3 Encounters:  02/23/21 118/78  02/04/21 112/80  01/14/21 100/64   Pulse Readings from Last 3 Encounters:  02/23/21 69  02/04/21 70  01/14/21 85    PROVIDERS: Pcp, No [name], Matthew, MD is pulmonologist   LABS: As indicated on arrival. See DISCUSSION. Last results include: Lab Results  Component Value Date   WBC 5.8 12/19/2020   HGB 19.3 (H) 12/19/2020   HCT 61.9 (H) 12/19/2020   PLT 123 (L) 12/19/2020   GLUCOSE 93 12/21/2020   ALT 21 12/19/2020   AST 24 12/19/2020   NA 140 12/21/2020   K 4.3 12/21/2020   CL 96 (L) 12/21/2020   CREATININE 0.55 12/21/2020   BUN 14 12/21/2020   CO2 37 (H) 12/21/2020   TSH 4.523 (H) 12/19/2020    PFTs 01/26/21:  FEV1PRE 1.09 01/26/2021 1552    FEV1POST 1.07 01/26/2021 1552    FVCPRE 1.96 01/26/2021 1552    FVCPOST 1.93 01/26/2021 1552    TLC 4.27 01/26/2021 1552    DLCOUNC 15.04 01/26/2021 1552    PREFEV1FVCRT 55 01/26/2021 1552    PSTFEV1FVCRT 55 01/26/2021 1552    IMAGES: PET Scan 02/17/21: IMPRESSION: 1. The 2.0 by 1.6 cm left lower lobe nodule has a maximum SUV of 6.3, high suspicion for malignancy. 2. The medial nodular portion of the more posterior of the right upper lobe mass is has a maximum SUV of 7.0, high  suspicion for malignancy. The less nodular and more lateral component of this lesion has a maximum SUV of 2.4. 3. The more anterior of the right apical masses, has maximum SUV of 2.8, still suspicious for potential malignancy given the morphology. 4. No current hypermetabolic adenopathy or distant metastatic lesions identified. 5. Other imaging findings of potential clinical significance: Aortic Atherosclerosis  (ICD10-I70.0) and Emphysema (ICD10-J43.9). Coronary atherosclerosis. Infrarenal left-sided IVC, a venous variant. Sigmoid colon diverticulosis.    EKG: 12/18/20: Sinus rhythm Biatrial enlargement Left anterior fascicular block Right ventricular hypertrophy Nonspecific T abnormalities, anterior leads No old tracing to compare Confirmed by Aletta Edouard [phone]) on 12/18/2020 12:26:27 PM   CV: Echo 12/19/20: IMPRESSIONS   1. Left ventricular ejection fraction, by estimation, is 60 to 65%. The  left ventricle has normal function. The left ventricle has no regional  wall motion abnormalities. Left ventricular diastolic parameters are  consistent with Grade II diastolic  dysfunction (pseudonormalization).   2. Right ventricular systolic function is normal. The right ventricular  size is moderately enlarged. D-shaped interventricular septum suggesting  RV pressure/volume overload. There is mildly elevated pulmonary artery  systolic pressure. The estimated  right ventricular systolic pressure is 55.2 mmHg.   3. Right atrial size was mildly dilated.   4. The mitral valve is normal in structure. No evidence of mitral valve  regurgitation. No evidence of mitral stenosis.   5. The aortic valve is tricuspid. Aortic valve regurgitation is not  visualized. Mild aortic valve sclerosis is present, with no evidence of  aortic valve stenosis.   6. The inferior vena cava is normal in size with greater than 50%  respiratory variability, suggesting right atrial pressure of 3 mmHg.    Past Medical History:  Diagnosis Date   COPD (chronic obstructive pulmonary disease) (HCC)    Dyspnea    GERD (gastroesophageal reflux disease)    Pneumonia     Past Surgical History:  Procedure Laterality Date   DILATION AND CURETTAGE OF UTERUS     EYE SURGERY     bilateral cataract removal   TONSILLECTOMY      MEDICATIONS: No current facility-administered medications for this encounter.     Fluticasone-Umeclidin-Vilant (TRELEGY ELLIPTA) 200-62.5-25 MCG/ACT AEPB    Myra Gianotti, PA-C Surgical Short Stay/Anesthesiology [facility] Phone [phone] [facility] Phone [phone] 03/06/2021 2:09 PM

## 2021-03-06 NOTE — Progress Notes (Signed)
PCP - no pcp  PUlmonology: Eric Form, Larey Days  PPM/ICD - denies   Chest x-ray - 12/19/20 EKG - 12/19/20 Stress Test - denies ECHO - 12/19/20 Cardiac Cath - denies  CPAP - denies  No diabetes   ERAS Protcol - yes  COVID TEST- scheduled 02/03/21 by [place] office  Anesthesia review: yes, but last office note with sarah groce states she was discharged home with 4L O2 last hospital admission. States she currently wears 2L O2 prn at home.   Patient verbally denies any shortness of breath, fever, cough and chest pain during phone call   -------------  SDW INSTRUCTIONS given:  Your procedure is scheduled on 03/09/21.  Report to [facility] Main Entrance "A" at [place].M., and check in at the Admitting office.  Call this number if you have problems the morning of surgery:  [phone]   Remember:  Do not eat after midnight the night before your surgery  You may drink clear liquids until 6:45am the morning of your surgery.   Clear liquids allowed are: Water, Non-Citrus Juices (without pulp), Carbonated Beverages, Clear Tea, Black Coffee Only, and Gatorade    Take these medicines the morning of surgery with A SIP OF WATER Trelegy Inhaler (bring with you the day of surgery)  As of today, STOP taking any Aspirin (unless otherwise instructed by your surgeon) Aleve, Naproxen, Ibuprofen, Motrin, Advil, Goody's, BC's, all herbal medications, fish oil, and all vitamins.                      Do not wear jewelry, make up, or nail polish            Do not wear lotions, powders, perfumes/colognes, or deodorant.            Do not shave 48 hours prior to surgery.  Men may shave face and neck.            Do not bring valuables to the hospital.            [facility] is not responsible for any belongings or valuables.  Do NOT Smoke (Tobacco/Vaping) or drink Alcohol 24 hours prior to your procedure If you use a CPAP at night, you may bring all equipment for your overnight stay.    Contacts, glasses, dentures or bridgework may not be worn into surgery.      For patients admitted to the hospital, discharge time will be determined by your treatment team.   Patients discharged the day of surgery will not be allowed to drive home, and someone needs to stay with them for 24 hours.    Special instructions:   - Preparing For Surgery  Before surgery, you can play an important role. Because skin is not sterile, your skin needs to be as free of germs as possible. You can reduce the number of germs on your skin by washing with CHG (chlorahexidine gluconate) Soap before surgery.  CHG is an antiseptic cleaner which kills germs and bonds with the skin to continue killing germs even after washing.    Oral Hygiene is also important to reduce your risk of infection.  Remember - BRUSH YOUR TEETH THE MORNING OF SURGERY WITH YOUR REGULAR TOOTHPASTE  Please do not use if you have an allergy to CHG or antibacterial soaps. If your skin becomes reddened/irritated stop using the CHG.  Do not shave (including legs and underarms) for at least 48 hours prior to first CHG shower. It  is OK to shave your face.  Please follow these instructions carefully.   Shower the NIGHT BEFORE SURGERY and the MORNING OF SURGERY with DIAL Soap.   Pat yourself dry with a CLEAN TOWEL.  Wear CLEAN PAJAMAS to bed the night before surgery  Place CLEAN SHEETS on your bed the night of your first shower and DO NOT SLEEP WITH PETS.   Day of Surgery: Please shower morning of surgery  Wear Clean/Comfortable clothing the morning of surgery Do not apply any deodorants/lotions.   Remember to brush your teeth WITH YOUR REGULAR TOOTHPASTE.   Questions were answered. Patient verbalized understanding of instructions.

## 2021-03-09 ENCOUNTER — Ambulatory Visit (HOSPITAL_COMMUNITY)
Admission: RE | Admit: 2021-03-09 | Discharge: 2021-03-09 | Disposition: A | Discharge status: Home / Self Care | Payer: Medicare Other | Attending: Emergency Medicine | Admitting: Emergency Medicine

## 2021-03-09 ENCOUNTER — Ambulatory Visit (HOSPITAL_COMMUNITY): Payer: Medicare Other | Admitting: Vascular Surgery

## 2021-03-09 ENCOUNTER — Other Ambulatory Visit (HOSPITAL_COMMUNITY): Payer: Self-pay | Admitting: Emergency Medicine

## 2021-03-09 ENCOUNTER — Ambulatory Visit (HOSPITAL_COMMUNITY): Payer: Medicare Other

## 2021-03-09 ENCOUNTER — Encounter (HOSPITAL_COMMUNITY): Payer: Self-pay | Admitting: Emergency Medicine

## 2021-03-09 ENCOUNTER — Other Ambulatory Visit: Payer: Self-pay

## 2021-03-09 ENCOUNTER — Encounter (HOSPITAL_COMMUNITY)
Admission: RE | Disposition: A | Discharge status: Home / Self Care | Payer: Self-pay | Source: Home / Self Care | Attending: Emergency Medicine

## 2021-03-09 DIAGNOSIS — J439 Emphysema, unspecified: Secondary | ICD-10-CM | POA: Insufficient documentation

## 2021-03-09 DIAGNOSIS — F17200 Nicotine dependence, unspecified, uncomplicated: Secondary | ICD-10-CM | POA: Diagnosis not present

## 2021-03-09 DIAGNOSIS — R911 Solitary pulmonary nodule: Secondary | ICD-10-CM | POA: Diagnosis not present

## 2021-03-09 DIAGNOSIS — C3411 Malignant neoplasm of upper lobe, right bronchus or lung: Secondary | ICD-10-CM | POA: Diagnosis not present

## 2021-03-09 DIAGNOSIS — C3432 Malignant neoplasm of lower lobe, left bronchus or lung: Secondary | ICD-10-CM | POA: Insufficient documentation

## 2021-03-09 DIAGNOSIS — R918 Other nonspecific abnormal finding of lung field: Secondary | ICD-10-CM

## 2021-03-09 DIAGNOSIS — I509 Heart failure, unspecified: Secondary | ICD-10-CM | POA: Insufficient documentation

## 2021-03-09 DIAGNOSIS — Z9889 Other specified postprocedural states: Secondary | ICD-10-CM

## 2021-03-09 DIAGNOSIS — Z9981 Dependence on supplemental oxygen: Secondary | ICD-10-CM | POA: Insufficient documentation

## 2021-03-09 DIAGNOSIS — Z419 Encounter for procedure for purposes other than remedying health state, unspecified: Secondary | ICD-10-CM

## 2021-03-09 HISTORY — DX: Gastro-esophageal reflux disease without esophagitis: K21.9

## 2021-03-09 HISTORY — PX: VIDEO BRONCHOSCOPY WITH ENDOBRONCHIAL NAVIGATION: SHX6175

## 2021-03-09 HISTORY — DX: Chronic obstructive pulmonary disease, unspecified: J44.9

## 2021-03-09 HISTORY — DX: Dyspnea, unspecified: R06.00

## 2021-03-09 HISTORY — PX: BRONCHIAL NEEDLE ASPIRATION BIOPSY: SHX5106

## 2021-03-09 HISTORY — PX: BRONCHIAL BIOPSY: SHX5109

## 2021-03-09 HISTORY — PX: BRONCHIAL BRUSHINGS: SHX5108

## 2021-03-09 HISTORY — DX: Pneumonia, unspecified organism: J18.9

## 2021-03-09 LAB — CBC
HCT: 44.1 % (ref 36.0–46.0)
Hemoglobin: 14.5 g/dL (ref 12.0–15.0)
MCH: 32.5 pg (ref 26.0–34.0)
MCHC: 32.9 g/dL (ref 30.0–36.0)
MCV: 98.9 fL (ref 80.0–100.0)
Platelets: 188 10*3/uL (ref 150–400)
RBC: 4.46 MIL/uL (ref 3.87–5.11)
RDW: 13.9 % (ref 11.5–15.5)
WBC: 7.3 10*3/uL (ref 4.0–10.5)
nRBC: 0 % (ref 0.0–0.2)

## 2021-03-09 SURGERY — VIDEO BRONCHOSCOPY WITH ENDOBRONCHIAL NAVIGATION
Anesthesia: General | Laterality: Bilateral

## 2021-03-09 MED ORDER — DEXAMETHASONE SODIUM PHOSPHATE 10 MG/ML IJ SOLN
INTRAMUSCULAR | Status: DC | PRN
  Administered 2021-03-09: 5 mg via INTRAVENOUS

## 2021-03-09 MED ORDER — SUGAMMADEX SODIUM 200 MG/2ML IV SOLN
INTRAVENOUS | Status: DC | PRN
  Administered 2021-03-09: 200 mg via INTRAVENOUS

## 2021-03-09 MED ORDER — ROCURONIUM BROMIDE 10 MG/ML (PF) SYRINGE
PREFILLED_SYRINGE | INTRAVENOUS | Status: DC | PRN
  Administered 2021-03-09: 50 mg via INTRAVENOUS
  Administered 2021-03-09 (×2): 20 mg via INTRAVENOUS

## 2021-03-09 MED ORDER — PROPOFOL 10 MG/ML IV BOLUS
INTRAVENOUS | Status: DC | PRN
  Administered 2021-03-09: 90 mg via INTRAVENOUS

## 2021-03-09 MED ORDER — LIDOCAINE 2% (20 MG/ML) 5 ML SYRINGE
INTRAMUSCULAR | Status: DC | PRN
Start: 2021-03-09 — End: 2021-03-09
  Administered 2021-03-09: 40 mg via INTRAVENOUS

## 2021-03-09 MED ORDER — LACTATED RINGERS IV SOLN: INTRAVENOUS | Status: DC

## 2021-03-09 MED ORDER — CHLORHEXIDINE GLUCONATE 0.12 % MT SOLN
15.0000 mL | Freq: Once | OROMUCOSAL | Status: AC
  Filled 2021-03-09: qty 15

## 2021-03-09 MED ORDER — FENTANYL CITRATE (PF) 250 MCG/5ML IJ SOLN
INTRAMUSCULAR | Status: DC | PRN
  Administered 2021-03-09: 100 ug via INTRAVENOUS

## 2021-03-09 MED ORDER — ONDANSETRON HCL 4 MG/2ML IJ SOLN
INTRAMUSCULAR | Status: DC | PRN
  Administered 2021-03-09: 4 mg via INTRAVENOUS

## 2021-03-09 MED ORDER — CHLORHEXIDINE GLUCONATE 0.12 % MT SOLN
OROMUCOSAL | Status: AC
  Administered 2021-03-09: 15 mL via OROMUCOSAL
  Filled 2021-03-09: qty 15

## 2021-03-09 MED ORDER — PHENYLEPHRINE 40 MCG/ML (10ML) SYRINGE FOR IV PUSH (FOR BLOOD PRESSURE SUPPORT)
PREFILLED_SYRINGE | INTRAVENOUS | Status: DC | PRN
Start: 2021-03-09 — End: 2021-03-09
  Administered 2021-03-09 (×2): 80 ug via INTRAVENOUS

## 2021-03-09 NOTE — Interval H&P Note (Signed)
History and Physical Interval Note:  03/09/2021 9:07 AM  Amy Sandoval  has presented today for surgery, with the diagnosis of multiple pulmonary nodules.  The various methods of treatment have been discussed with the patient and family. After consideration of risks, benefits and other options for treatment, the patient has consented to  Procedure(s) with comments: [place] (Bilateral) - ION w/ possible fiducial as a surgical intervention.  The patient's history has been reviewed, patient examined, no change in status, stable for surgery.  I have reviewed the patient's chart and labs.  Questions were answered to the patient's satisfaction.     Collene Gobble

## 2021-03-09 NOTE — Anesthesia Procedure Notes (Signed)
Procedure Name: Intubation Date/Time: 03/09/2021 9:20 AM Performed by: Thelma Comp, CRNA Pre-anesthesia Checklist: Patient identified, Emergency Drugs available, Suction available and Patient being monitored Patient Re-evaluated:Patient Re-evaluated prior to induction Oxygen Delivery Method: Circle System Utilized Preoxygenation: Pre-oxygenation with 100% oxygen Induction Type: IV induction Ventilation: Mask ventilation without difficulty Laryngoscope Size: Mac and 3 Grade View: Grade II Tube type: Oral Tube size: 8.5 mm Number of attempts: 1 Airway Equipment and Method: Stylet Placement Confirmation: ETT inserted through vocal cords under direct vision, positive ETCO2 and breath sounds checked- equal and bilateral Secured at: 22 cm Tube secured with: Tape Dental Injury: Teeth and Oropharynx as per pre-operative assessment

## 2021-03-09 NOTE — Discharge Instructions (Signed)
Flexible Bronchoscopy, Care After This sheet gives you information about how to care for yourself after your test. Your doctor may also give you more specific instructions. If you have problems or questions, contact your doctor. Follow these instructions at home: Eating and drinking Do not eat or drink anything (not even water) for 2 hours after your test, or until your numbing medicine (local anesthetic) wears off. When your numbness is gone and your cough and gag reflexes have come back, you may: Eat only soft foods. Slowly drink liquids. The day after the test, go back to your normal diet. Driving Do not drive for 24 hours if you were given a medicine to help you relax (sedative). Do not drive or use heavy machinery while taking prescription pain medicine. General instructions  Take over-the-counter and prescription medicines only as told by your doctor. Return to your normal activities as told. Ask what activities are safe for you. Do not use any products that have nicotine or tobacco in them. This includes cigarettes and e-cigarettes. If you need help quitting, ask your doctor. Keep all follow-up visits as told by your doctor. This is important. It is very important if you had a tissue sample (biopsy) taken. Get help right away if: You have shortness of breath that gets worse. You get light-headed. You feel like you are going to pass out (faint). You have chest pain. You cough up: More than a little blood. More blood than before. Summary Do not eat or drink anything (not even water) for 2 hours after your test, or until your numbing medicine wears off. Do not use cigarettes. Do not use e-cigarettes. Get help right away if you have chest pain.  Please call our office for any questions or concerns.  [phone].  This information is not intended to replace advice given to you by your health care provider. Make sure you discuss any questions you have with your health care  provider. Document Released: 02/14/2009 Document Revised: 04/01/2017 Document Reviewed: 05/07/2016 Elsevier Patient Education  2020 [employer].

## 2021-03-09 NOTE — Transfer of Care (Signed)
Immediate Anesthesia Transfer of Care Note  Patient: Chasitie Prather  Procedure(s) Performed: VIDEO BRONCHOSCOPY WITH ENDOBRONCHIAL NAVIGATION (Bilateral) BRONCHIAL BIOPSIES BRONCHIAL BRUSHINGS BRONCHIAL NEEDLE ASPIRATION BIOPSIES  Patient Location: PACU  Anesthesia Type:General  Level of Consciousness: drowsy, patient cooperative and responds to stimulation  Airway & Oxygen Therapy: Patient Spontanous Breathing and Patient connected to face mask oxygen  Post-op Assessment: Report given to RN and Post -op Vital signs reviewed and stable  Post vital signs: Reviewed and stable  Last Vitals:  Vitals Value Taken Time  BP 128/63 03/09/21 1120  Temp    Pulse 67 03/09/21 1120  Resp 13 03/09/21 1120  SpO2 96 % 03/09/21 1120  Vitals shown include unvalidated device data.  Last Pain:  Vitals:   03/09/21 0817  TempSrc:   PainSc: 0-No pain         Complications: No notable events documented.

## 2021-03-09 NOTE — Op Note (Signed)
Video Bronchoscopy with Robotic Assisted Bronchoscopic Navigation   Date of Operation: 03/09/2021   Pre-op Diagnosis: Bilateral pulmonary nodules  Post-op Diagnosis: Same  Surgeon: Baltazar Apo  Assistants: None  Anesthesia: General endotracheal anesthesia  Operation: Flexible video fiberoptic bronchoscopy with robotic assistance and biopsies.  Estimated Blood Loss: Minimal  Complications: None  Indications and History: Amy Sandoval is a 86 y.o. woman with history of tobacco use.  She was found to have bilateral pulmonary nodules on CT scan of the chest, confirm hypermetabolism on subsequent PET scan.  Recommendation was made to achieve tissue diagnosis via navigational bronchoscopy with biopsies. The risks, benefits, complications, treatment options and expected outcomes were discussed with the patient.  The possibilities of pneumothorax, pneumonia, reaction to medication, pulmonary aspiration, perforation of a viscus, bleeding, failure to diagnose a condition and creating a complication requiring transfusion or operation were discussed with the patient who freely signed the consent.    Description of Procedure: The patient was seen in the Preoperative Area, was examined and was deemed appropriate to proceed.  The patient was taken to [facility] endoscopy room 3, identified as Amy Sandoval and the procedure verified as Flexible Video Fiberoptic Bronchoscopy.  A Time Out was held and the above information confirmed.   Prior to the date of the procedure a high-resolution CT scan of the chest was performed. Utilizing ION software program a virtual tracheobronchial tree was generated to allow the creation of distinct navigation pathways to the patient's parenchymal abnormalities. After being taken to the operating room general anesthesia was initiated and the patient  was orally intubated. The video fiberoptic bronchoscope was introduced via the endotracheal tube and a general inspection was  performed which showed normal right and left lung anatomy, aspiration of the bilateral mainstems was completed to remove any remaining secretions. Robotic catheter inserted into patient's endotracheal tube.   Target #1 right upper lobe (more anterior) nodule: The distinct navigation pathways prepared prior to this procedure were then utilized to navigate to patient's lesion identified on CT scan. The robotic catheter was secured into place and the vision probe was withdrawn.  Lesion location was approximated using fluoroscopy and radial endobronchial ultrasound for peripheral targeting.  Local registration and targeting performed using [employer] three-dimensional imaging.  Under fluoroscopic guidance transbronchial needle brushings, transbronchial needle biopsies, and transbronchial forceps biopsies were performed to be sent for cytology and pathology.   Target #2 right lower lobe (more posterior) nodule: The distinct navigation pathways prepared prior to this procedure were then utilized to navigate to patient's lesion identified on CT scan. The robotic catheter was secured into place and the vision probe was withdrawn.  Lesion location was approximated using fluoroscopy and radial endobronchial ultrasound for peripheral targeting. Under fluoroscopic guidance transbronchial needle brushings, transbronchial needle biopsies, and transbronchial forceps biopsies were performed to be sent for cytology and pathology.   Target #3 left lower lobe pulmonary nodule: The distinct navigation pathways prepared prior to this procedure were then utilized to navigate to patient's lesion identified on CT scan. The robotic catheter was secured into place and the vision probe was withdrawn.  Lesion location was approximated using fluoroscopy and radial endobronchial ultrasound for peripheral targeting. Local registration and targeting performed using Cios three-dimensional imaging. Under fluoroscopic guidance transbronchial  needle brushings, transbronchial needle biopsies, and transbronchial forceps biopsies were performed to be sent for cytology and pathology.   At the end of the procedure a general airway inspection was performed and there was no evidence of active bleeding. The  bronchoscope was removed.  The patient tolerated the procedure well. There was no significant blood loss and there were no obvious complications. A post-procedural chest x-ray is pending.  Samples Target #1: 1. Transbronchial needle brushings from right upper lobe pulmonary nodule 2. Transbronchial Wang needle biopsies from right upper lobe pulmonary nodule 3. Transbronchial forceps biopsies from right upper lobe pulmonary nodule  Samples Target #2: 1. Transbronchial needle brushings from posterior right upper lobe pulmonary nodule 2. Transbronchial Wang needle biopsies from posterior right upper lobe pulmonary nodule 3. Transbronchial forceps biopsies from posterior right upper lobe pulmonary nodule  Samples Target #3: 1. Transbronchial needle brushings from left lower lobe pulmonary nodule 2. Transbronchial Wang needle biopsies from left lower lobe pulmonary nodule 3. Transbronchial forceps biopsies from left lower lobe pulmonary nodule  Plans:  The patient will be discharged from the PACU to home when recovered from anesthesia and after chest x-ray is reviewed. We will review the cytology, pathology and microbiology results with the patient when they become available. Outpatient followup will be with Dr. Lamonte Sakai or Dr. Valeta Harms.    Baltazar Apo, MD, PhD 03/09/2021, 11:12 AM [facility] [phone] or if no answer before 7:00PM call [phone] For any issues after 7:00PM please call eLink [phone]

## 2021-03-10 NOTE — Anesthesia Postprocedure Evaluation (Signed)
Anesthesia Post Note  Patient: Amy Sandoval  Procedure(s) Performed: VIDEO BRONCHOSCOPY WITH ENDOBRONCHIAL NAVIGATION (Bilateral) BRONCHIAL BIOPSIES BRONCHIAL BRUSHINGS BRONCHIAL NEEDLE ASPIRATION BIOPSIES     Patient location during evaluation: PACU Anesthesia Type: General Level of consciousness: awake and alert Pain management: pain level controlled Vital Signs Assessment: post-procedure vital signs reviewed and stable Respiratory status: spontaneous breathing, nonlabored ventilation, respiratory function stable and patient connected to nasal cannula oxygen Cardiovascular status: blood pressure returned to baseline and stable Postop Assessment: no apparent nausea or vomiting Anesthetic complications: no   No notable events documented.  Last Vitals:  Vitals:   03/09/21 1205 03/09/21 1220  BP: 114/65 (!) 111/59  Pulse: 69 64  Resp: 16 10  Temp:  36.7 C  SpO2: (!) 85% (!) 88%    Last Pain:  Vitals:   03/09/21 1220  TempSrc:   PainSc: 0-No pain                 Benaiah Behan L Caitlin Ainley

## 2021-03-11 ENCOUNTER — Telehealth: Payer: Self-pay | Admitting: Emergency Medicine

## 2021-03-11 ENCOUNTER — Encounter (HOSPITAL_COMMUNITY): Payer: Self-pay | Admitting: Emergency Medicine

## 2021-03-11 DIAGNOSIS — C3490 Malignant neoplasm of unspecified part of unspecified bronchus or lung: Secondary | ICD-10-CM

## 2021-03-11 LAB — CYTOLOGY - NON PAP

## 2021-03-11 NOTE — Telephone Encounter (Signed)
Discussed bronchoscopy results with the patient by phone.  Showed non-small cell lung cancer in all 3 samples, most consistent with adenocarcinoma.  Plan to refer her to thoracic oncology [facility].

## 2021-03-12 ENCOUNTER — Telehealth: Payer: Self-pay | Admitting: Internal Medicine

## 2021-03-12 ENCOUNTER — Encounter: Payer: Self-pay | Admitting: *Deleted

## 2021-03-12 DIAGNOSIS — R911 Solitary pulmonary nodule: Secondary | ICD-10-CM

## 2021-03-12 DIAGNOSIS — C3490 Malignant neoplasm of unspecified part of unspecified bronchus or lung: Secondary | ICD-10-CM

## 2021-03-12 NOTE — Progress Notes (Signed)
I received referral on Amy Sandoval to call and schedule her to be seen on 03/18/21 labs at 1:30 and to see Dr. Julien Nordmann at 2:00.

## 2021-03-12 NOTE — Telephone Encounter (Signed)
Scheduled appt per 11/9 referral. Pt is aware of appt date and time.

## 2021-03-17 ENCOUNTER — Ambulatory Visit (HOSPITAL_COMMUNITY)
Admission: RE | Admit: 2021-03-17 | Discharge: 2021-03-17 | Disposition: A | Discharge status: Home / Self Care | Payer: Medicare Other | Source: Ambulatory Visit | Attending: Internal Medicine | Admitting: Internal Medicine

## 2021-03-17 DIAGNOSIS — C3490 Malignant neoplasm of unspecified part of unspecified bronchus or lung: Secondary | ICD-10-CM | POA: Insufficient documentation

## 2021-03-17 MED ORDER — GADOBUTROL 1 MMOL/ML IV SOLN
6.0000 mL | Freq: Once | INTRAVENOUS | Status: AC | PRN
  Administered 2021-03-17: 6 mL via INTRAVENOUS

## 2021-03-18 ENCOUNTER — Encounter: Payer: Self-pay | Admitting: Internal Medicine

## 2021-03-18 ENCOUNTER — Encounter: Payer: Self-pay | Admitting: *Deleted

## 2021-03-18 ENCOUNTER — Other Ambulatory Visit: Payer: Self-pay

## 2021-03-18 ENCOUNTER — Inpatient Hospital Stay: Payer: Medicare Other | Attending: Internal Medicine

## 2021-03-18 ENCOUNTER — Inpatient Hospital Stay (HOSPITAL_BASED_OUTPATIENT_CLINIC_OR_DEPARTMENT_OTHER): Payer: Medicare Other | Admitting: Internal Medicine

## 2021-03-18 ENCOUNTER — Inpatient Hospital Stay: Payer: Medicare Other

## 2021-03-18 ENCOUNTER — Inpatient Hospital Stay: Payer: Medicare Other | Admitting: *Deleted

## 2021-03-18 VITALS — BP 126/56 | HR 84 | Temp 97.5°F | Resp 20 | Ht 65.0 in | Wt 137.5 lb

## 2021-03-18 DIAGNOSIS — R918 Other nonspecific abnormal finding of lung field: Secondary | ICD-10-CM | POA: Insufficient documentation

## 2021-03-18 DIAGNOSIS — J449 Chronic obstructive pulmonary disease, unspecified: Secondary | ICD-10-CM | POA: Diagnosis not present

## 2021-03-18 DIAGNOSIS — K219 Gastro-esophageal reflux disease without esophagitis: Secondary | ICD-10-CM | POA: Insufficient documentation

## 2021-03-18 DIAGNOSIS — C3411 Malignant neoplasm of upper lobe, right bronchus or lung: Secondary | ICD-10-CM | POA: Insufficient documentation

## 2021-03-18 DIAGNOSIS — F17210 Nicotine dependence, cigarettes, uncomplicated: Secondary | ICD-10-CM | POA: Diagnosis not present

## 2021-03-18 DIAGNOSIS — R911 Solitary pulmonary nodule: Secondary | ICD-10-CM

## 2021-03-18 LAB — CBC WITH DIFFERENTIAL (CANCER CENTER ONLY)
Abs Immature Granulocytes: 0.02 10*3/uL (ref 0.00–0.07)
Basophils Absolute: 0.1 10*3/uL (ref 0.0–0.1)
Basophils Relative: 1 %
Eosinophils Absolute: 0.2 10*3/uL (ref 0.0–0.5)
Eosinophils Relative: 2 %
HCT: 41.8 % (ref 36.0–46.0)
Hemoglobin: 13.5 g/dL (ref 12.0–15.0)
Immature Granulocytes: 0 %
Lymphocytes Relative: 32 %
Lymphs Abs: 2.5 10*3/uL (ref 0.7–4.0)
MCH: 31.8 pg (ref 26.0–34.0)
MCHC: 32.3 g/dL (ref 30.0–36.0)
MCV: 98.6 fL (ref 80.0–100.0)
Monocytes Absolute: 0.7 10*3/uL (ref 0.1–1.0)
Monocytes Relative: 9 %
Neutro Abs: 4.5 10*3/uL (ref 1.7–7.7)
Neutrophils Relative %: 56 %
Platelet Count: 197 10*3/uL (ref 150–400)
RBC: 4.24 MIL/uL (ref 3.87–5.11)
RDW: 14.6 % (ref 11.5–15.5)
WBC Count: 8 10*3/uL (ref 4.0–10.5)
nRBC: 0 % (ref 0.0–0.2)

## 2021-03-18 LAB — CMP (CANCER CENTER ONLY)
ALT: 33 U/L (ref 0–44)
AST: 36 U/L (ref 15–41)
Albumin: 3.7 g/dL (ref 3.5–5.0)
Alkaline Phosphatase: 117 U/L (ref 38–126)
Anion gap: 9 (ref 5–15)
BUN: 15 mg/dL (ref 8–23)
CO2: 29 mmol/L (ref 22–32)
Calcium: 8.8 mg/dL — ABNORMAL LOW (ref 8.9–10.3)
Chloride: 104 mmol/L (ref 98–111)
Creatinine: 0.79 mg/dL (ref 0.44–1.00)
GFR, Estimated: >60 mL/min (ref >60)
Glucose, Bld: 137 mg/dL — ABNORMAL HIGH (ref 70–99)
Potassium: 4.2 mmol/L (ref 3.5–5.1)
Sodium: 142 mmol/L (ref 135–145)
Total Bilirubin: 0.5 mg/dL (ref 0.3–1.2)
Total Protein: 6.7 g/dL (ref 6.5–8.1)

## 2021-03-18 NOTE — Progress Notes (Signed)
[place] [phone]   [phone]  CONSULT NOTE  REFERRING PHYSICIAN: Dr. Baltazar Apo  REASON FOR CONSULTATION:  86 years old white female recently diagnosed with lung cancer.  HPI Amy Sandoval is a 86 y.o. female with past medical history significant for COPD, pneumonia and long history of smoking.  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  She started complaining of worsening shortness of breath.  [facility] complaining of the shortness of breath and her oxygen saturation was low.  She was sent to the emergency department for evaluation.  She had chest x-ray followed by CT scan of the chest on 12/18/2020 and that showed masslike nodular opacity of the right upper lobe measuring 3.9 x 2.6 cm with additional nodular opacity seen posteriorly in the right upper lobe measuring 2.8 x 1.9 cm.  There was additional nodular opacity of the left lower lobe measuring 1.6 x 1.5 cm.  The scan also showed small right greater than left pleural effusion and atelectasis.  On 12/19/2020 she underwent ultrasound-guided right thoracentesis with drainage of 450 mL of pleural fluid.  The cytology showed reactive mesothelial cells present.  She had a PET scan on 02/17/2021 and that showed the anterior mass in the right lung apex measuring 4.2 x 2.9 cm with a maximum SUV of 2.8.  The medial nodular portion of the posterior mass in the apical segment right upper lobe measured 1.8 x 1.5 cm and has maximum SUV of 7.0.  The more posterior portion of this lesion measured 2.1 x 1.0 cm and this posterior portion has SUV of 2.4.  The 2.0 x 1.6 cm nodule in the left lower lobe has a maximum SUV of 6.3.  There was no appreciable hypermetabolic adenopathy in the chest and no evidence of metastatic disease outside the chest. On March 09, 2021 the patient underwent flexible  video fiberoptic bronchoscopy with robotic assistance and biopsies under the care of Dr. Lamonte Sakai. The final pathology ([id number] and [id number]) showed malignant cells consistent with non-small cell carcinoma.  There is no further identification of the subtype of the tumor. Dr. Lamonte Sakai kindly referred the patient to me today for evaluation and recommendation regarding treatment of her condition. She had MRI of the brain performed yesterday but the final report is still pending. When seen today she continues to have mild cough but no significant chest pain, shortness of breath or hemoptysis.  She denied having any nausea, vomiting, diarrhea or constipation.  She has no weight loss or night sweats.  She has no headache or visual changes. Family history significant for mother who had metastatic cancer to the brain.  Father had black lung as he used to work in [employer].  She has a sister who had hemochromatosis. The patient is single and has 2 daughters.  She was accompanied today by her daughter, Anne Ng Power.  She works in a Producer, television/film/video.  She has a history for smoking up to 1 pack/day for 65 years and quit in August 2022.  She drinks alcohol occasionally no history of drug abuse.  HPI  Past Medical History:  Diagnosis Date   COPD (chronic obstructive pulmonary disease) (HCC)    Dyspnea    GERD (gastroesophageal reflux disease)    Pneumonia     Past Surgical History:  Procedure Laterality Date   BRONCHIAL BIOPSY  03/09/2021   Procedure: BRONCHIAL BIOPSIES;  Surgeon: Collene Gobble, MD;  Location: [facility] ENDOSCOPY;  Service: Pulmonary;;   BRONCHIAL BRUSHINGS  03/09/2021   Procedure: BRONCHIAL BRUSHINGS;  Surgeon: Collene Gobble, MD;  Location: [facility] ENDOSCOPY;  Service: Pulmonary;;   BRONCHIAL NEEDLE ASPIRATION BIOPSY  03/09/2021   Procedure: BRONCHIAL NEEDLE ASPIRATION BIOPSIES;  Surgeon: Collene Gobble, MD;  Location: [facility] ENDOSCOPY;  Service: Pulmonary;;   DILATION AND CURETTAGE OF  UTERUS     EYE SURGERY     bilateral cataract removal   TONSILLECTOMY     VIDEO BRONCHOSCOPY WITH ENDOBRONCHIAL NAVIGATION Bilateral 03/09/2021   Procedure: VIDEO BRONCHOSCOPY WITH ENDOBRONCHIAL NAVIGATION;  Surgeon: Collene Gobble, MD;  Location: [facility] ENDOSCOPY;  Service: Pulmonary;  Laterality: Bilateral;  ION w/ possible fiducial    Family History  Problem Relation Age of Onset   Hypertension Other     Social History Social History   Tobacco Use   Smoking status: Some Days    Packs/day: 0.10    Years: 60.00    Pack years: 6.00    Types: Cigarettes    Passive exposure: Past   Smokeless tobacco: Never  Vaping Use   Vaping Use: Former   Substances: Nicotine, Flavoring  Substance Use Topics   Alcohol use: Yes    Comment: Social   Drug use: Never    Allergies  Allergen Reactions   Penicillins Rash    No current outpatient medications on file.   No current facility-administered medications for this visit.    Review of Systems  Constitutional: positive for fatigue Eyes: negative Ears, nose, mouth, throat, and face: negative Respiratory: positive for cough Cardiovascular: negative Gastrointestinal: negative Genitourinary:negative Integument/breast: negative Hematologic/lymphatic: negative Musculoskeletal:negative Neurological: negative Behavioral/Psych: negative Endocrine: negative Allergic/Immunologic: negative  Physical Exam  WUJ:WJXBJ, healthy, no distress, well nourished, and well developed SKIN: skin color, texture, turgor are normal, no rashes or significant lesions HEAD: Normocephalic, No masses, lesions, tenderness or abnormalities EYES: normal, PERRLA, Conjunctiva are pink and non-injected EARS: External ears normal, Canals clear OROPHARYNX:no exudate, no erythema, and lips, buccal mucosa, and tongue normal  NECK: supple, no adenopathy, no JVD LYMPH:  no palpable lymphadenopathy, no hepatosplenomegaly BREAST:not examined LUNGS: clear to  auscultation , and palpation HEART: regular rate & rhythm, no murmurs, and no gallops ABDOMEN:abdomen soft, non-tender, normal bowel sounds, and no masses or organomegaly BACK: Back symmetric, no curvature., No CVA tenderness EXTREMITIES:no joint deformities, effusion, or inflammation, no edema  NEURO: alert & oriented x 3 with fluent speech, no focal motor/sensory deficits  PERFORMANCE STATUS: ECOG 1  LABORATORY DATA: Lab Results  Component Value Date   WBC 8.0 03/18/2021   HGB 13.5 03/18/2021   HCT 41.8 03/18/2021   MCV 98.6 03/18/2021   PLT 197 03/18/2021      Chemistry      Component Value Date/Time   NA 140 12/21/2020 0333   K 4.3 12/21/2020 0333   CL 96 (L) 12/21/2020 0333   CO2 37 (H) 12/21/2020 0333   BUN 14 12/21/2020 0333   CREATININE 0.55 12/21/2020 0333      Component Value Date/Time   CALCIUM 8.2 (L) 12/21/2020 0333   ALKPHOS 82 12/19/2020 0017   AST 24 12/19/2020 0017   ALT 21 12/19/2020 0017   BILITOT 0.7 12/19/2020 0017       RADIOGRAPHIC STUDIES: NM PET Image Initial (PI) Skull Base To Thigh  Result Date: 02/18/2021 CLINICAL DATA:  Initial treatment strategy for lung nodule. EXAM: NUCLEAR MEDICINE PET  SKULL BASE TO THIGH TECHNIQUE: 6.4 mCi F-18 FDG was injected intravenously. Full-ring PET imaging was performed from the skull base to thigh after the radiotracer. CT data was obtained and used for attenuation correction and anatomic localization. Fasting blood glucose: 76 mg/dl COMPARISON:  Chest CT 02/02/2021 FINDINGS: Mediastinal blood pool activity: SUV max 2.3 Liver activity: SUV max NA NECK: No significant abnormal hypermetabolic activity in this region. Incidental CT findings: Bilateral common carotid atherosclerotic calcification. CHEST: The anterior mass in the right lung apex measuring 4.2 by 2.9 cm on image 11 series 8 has a maximum SUV of 2.8. The medial nodular portion of the posterior mass in the apical segment right upper lobe measures 1.8 by  1.5 cm on image 15 of series 8 and has maximum SUV of 7.0. The more posterior portion of this lesion measures 2.1 by 1.0 cm and this posterior portion has a maximum SUV of 2.4. The 2.0 by 1.6 cm nodule in the left lower lobe on image 38 series 8 has a maximum SUV of 6.3. Linear nodularity laterally at the right lung base for example on image 51 of series 8 is probably a small mucocele and not appreciably hypermetabolic although is below sensitive PET-CT size thresholds. No appreciable hypermetabolic adenopathy in the chest. Incidental CT findings: Centrilobular emphysema. Coronary, aortic arch, and branch vessel atherosclerotic vascular disease. Moderate cardiomegaly. ABDOMEN/PELVIS: No significant abnormal hypermetabolic activity in this region. Incidental CT findings: Atherosclerosis is present, including aortoiliac atherosclerotic disease. Left-sided infrarenal IVC, a venous variant. Sigmoid colon diverticulosis. SKELETON: No significant abnormal hypermetabolic activity in this region. Incidental CT findings: none IMPRESSION: 1. The 2.0 by 1.6 cm left lower lobe nodule has a maximum SUV of 6.3, high suspicion for malignancy. 2. The medial nodular portion of the more posterior of the right upper lobe mass is has a maximum SUV of 7.0, high suspicion for malignancy. The less nodular and more lateral component of this lesion has a maximum SUV of 2.4. 3. The more anterior of the right apical masses, has maximum SUV of 2.8, still suspicious for potential malignancy given the morphology. 4. No current hypermetabolic adenopathy or distant metastatic lesions identified. 5. Other imaging findings of potential clinical significance: Aortic Atherosclerosis (ICD10-I70.0) and Emphysema (ICD10-J43.9). Coronary atherosclerosis. Infrarenal left-sided IVC, a venous variant. Sigmoid colon diverticulosis. Electronically Signed   By: Van Clines M.D.   On: 02/18/2021 15:08   DG Chest Port 1 View  Result Date:  03/09/2021 CLINICAL DATA:  Status post bronchoscopy EXAM: PORTABLE CHEST 1 VIEW COMPARISON:  Prior chest x-ray 12/19/2020 FINDINGS: No evidence of pneumothorax or hemothorax status post bronchoscopy. Stable right apical pulmonary nodule/mass. Unchanged cardiomegaly and mediastinal contours. Pulmonary vascular congestion without overt edema. Bilateral bronchitic changes and mild interstitial prominence are similar. No acute osseous abnormality. IMPRESSION: No evidence of pneumothorax or hemothorax following bronchoscopy. Stable right upper lobe pulmonary nodule/mass. Electronically Signed   By: Jacqulynn Cadet M.D.   On: 03/09/2021 11:39   DG C-Arm 1-60 Min-No Report  Result Date: 03/09/2021 Fluoroscopy was utilized by the requesting physician.  No radiographic interpretation.   DG C-ARM BRONCHOSCOPY  Result Date: 03/09/2021 C-ARM BRONCHOSCOPY: Fluoroscopy was utilized by the requesting physician.  No radiographic interpretation.    ASSESSMENT: This is a very pleasant 86 years old white female recently diagnosed with a stage IV (T2b, N0, M1 a) non-small cell lung cancer with unclear subtype on the pathology report diagnosed in November 2022 and presented with right upper lobe lung mass as well  as right upper lobe nodule in addition to right lower lobe nodule.   PLAN: I had a lengthy discussion with the patient and her daughter today about her current disease stage, prognosis and treatment options. I personally and independently reviewed the scan images and discussed the result and showed the images to the patient and her daughter. I explained to the patient that she is likely to have stage IV with the bilateral pulmonary nodules that biopsy-proven to be a lung cancer, non-small cell.  But this also could be looked at as synchronous primaries in the right and left lungs. I discussed with the patient her treatment options including referral to surgery for consideration of right upper lobectomy  followed by SBRT to the left lower lobe lung nodule versus consideration of SBRT to all the pulmonary lesions but this may result significant radiation-induced pneumonitis and fibrosis.  I also discussed with the patient the option of systemic treatment with either targeted agent if she has an actionable mutation or systemic chemotherapy in combination with immunotherapy if the patient has no actionable mutations. The material from the tissue biopsy were not sufficient for identification of the subtype of the lung cancer and probably for molecular studies. I will send the blood test to [facility] for molecular studies. I would wait for the final report of the brain MRI to rule out brain metastasis. I will see the patient back for follow-up visit in around 2 weeks for evaluation and more detailed discussion of her treatment options based on the molecular studies and the staging work-up. For the smoking cessation, I strongly encouraged the patient to continue with the smoking cessation started in August. She was advised to call immediately if she has any other concerning symptoms in the interval.  The patient voices understanding of current disease status and treatment options and is in agreement with the current care plan.  All questions were answered. The patient knows to call the clinic with any problems, questions or concerns. We can certainly see the patient much sooner if necessary.  Thank you so much for allowing me to participate in the care of [employer]. I will continue to follow up the patient with you and assist in her care.  The total time spent in the appointment was 60 minutes.  Disclaimer: This note was dictated with voice recognition software. Similar sounding words can inadvertently be transcribed and may not be corrected upon review.   Eilleen Kempf March 18, 2021, 2:00 PM

## 2021-03-18 NOTE — Progress Notes (Signed)
Oncology Nurse Navigator Documentation  Oncology Nurse Navigator Flowsheets 03/18/2021  Abnormal Finding Date 12/18/2020  Confirmed Diagnosis Date 03/09/2021  Diagnosis Status Pending Molecular Studies  Planned Course of Treatment Radiation;Targeted Therapy;Chemotherapy  Navigator Follow Up Date: 03/23/2021  Navigator Follow Up Reason: Molecular Testing  Navigator Location [facility]  Navigator Encounter Type Clinic/MDC;Initial MedOnc  Patient Visit Type Initial;MedOnc  Treatment Phase Pre-Tx/Tx Discussion  Barriers/Navigation Needs Coordination of Care;Education  Education Newly Diagnosed Cancer Education;Other  Interventions Coordination of Care;Psycho-Social Support;Education  Acuity Level 2-Minimal Needs (1-2 Barriers Identified)  Coordination of Care Other  Education Method Verbal;Written  Time Spent with Patient 30

## 2021-03-19 ENCOUNTER — Encounter: Payer: Self-pay | Admitting: *Deleted

## 2021-03-19 ENCOUNTER — Other Ambulatory Visit: Payer: Self-pay | Admitting: *Deleted

## 2021-03-19 ENCOUNTER — Telehealth: Payer: Self-pay | Admitting: Internal Medicine

## 2021-03-19 LAB — CYTOLOGY - NON PAP

## 2021-03-19 NOTE — Telephone Encounter (Signed)
Scheduled appointment per 11/16 los. Patient is aware.

## 2021-03-19 NOTE — Progress Notes (Signed)
The proposed treatment discussed in conference is for discussion purpose only and is not a binding recommendation. The patient was not been physically examined, or presented with their treatment options. Therefore, final treatment plans cannot be decided.  

## 2021-03-19 NOTE — Progress Notes (Signed)
Oncology Nurse Navigator Documentation  Oncology Nurse Navigator Flowsheets 03/19/2021 03/18/2021  Abnormal Finding Date - 12/18/2020  Confirmed Diagnosis Date - 03/09/2021  Diagnosis Status Pathology Pending Pending Molecular Studies  Planned Course of Treatment - Radiation;Targeted Therapy;Chemotherapy  Navigator Follow Up Date: 03/24/2021 03/23/2021  Navigator Follow Up Reason: Retail banker [facility]  Navigator Encounter Type Other: Clinic/MDC;Initial MedOnc  Patient Visit Type Other Initial;MedOnc  Treatment Phase - Pre-Tx/Tx Discussion  Barriers/Navigation Needs Coordination of Care/per cancer conference discussion today, per Dr. Saralyn Pilar with pathology he states there enough tissue to send for molecular and PDL 1 testing. Per Dr. Julien Nordmann, I notified pathology assistant to send testing to [facility].  Coordination of Care;Education  Education - Newly Diagnosed Cancer Education;Other  Interventions Coordination of Care Coordination of Care;Psycho-Social Support;Education  Acuity Level 2-Minimal Needs (1-2 Barriers Identified) Level 2-Minimal Needs (1-2 Barriers Identified)  Coordination of Care Pathology Other  Education Method - Verbal;Written  Time Spent with Patient 15 30

## 2021-03-19 NOTE — Progress Notes (Signed)
Linked episodes

## 2021-03-23 ENCOUNTER — Ambulatory Visit: Payer: Medicare Other | Admitting: Acute Care

## 2021-03-25 ENCOUNTER — Encounter: Payer: Self-pay | Admitting: *Deleted

## 2021-03-25 NOTE — Progress Notes (Signed)
Oncology Nurse Navigator Documentation  Oncology Nurse Navigator Flowsheets 03/25/2021 03/19/2021 03/18/2021  Abnormal Finding Date - - 12/18/2020  Confirmed Diagnosis Date - - 03/09/2021  Diagnosis Status - Pathology Pending Pending Molecular Studies  Planned Course of Treatment - - Radiation;Targeted Therapy;Chemotherapy  Navigator Follow Up Date: 03/31/2021 03/24/2021 03/23/2021  Navigator Follow Up Reason: Engineer, manufacturing Location [facility]  Navigator Encounter Type Other: Other: Clinic/MDC;Initial MedOnc  Patient Visit Type Other Other Initial;MedOnc  Treatment Phase - - Pre-Tx/Tx Discussion  Barriers/Navigation Needs Coordination of Care/I followed up on Ms. Mcree Product/process development scientist. He test is pending with [facility].   Coordination of Care Coordination of Care;Education  Education - - Newly Diagnosed Cancer Education;Other  Interventions Coordination of Care Coordination of Care Coordination of Care;Psycho-Social Support;Education  Acuity Level 2-Minimal Needs (1-2 Barriers Identified) Level 2-Minimal Needs (1-2 Barriers Identified) Level 2-Minimal Needs (1-2 Barriers Identified)  Coordination of Care Pathology Pathology Other  Education Method - - Verbal;Written  Time Spent with Patient 75 64 33

## 2021-04-01 ENCOUNTER — Inpatient Hospital Stay: Payer: Medicare Other

## 2021-04-01 ENCOUNTER — Inpatient Hospital Stay (HOSPITAL_BASED_OUTPATIENT_CLINIC_OR_DEPARTMENT_OTHER): Payer: Medicare Other | Admitting: Internal Medicine

## 2021-04-01 ENCOUNTER — Encounter: Payer: Self-pay | Admitting: Internal Medicine

## 2021-04-01 ENCOUNTER — Other Ambulatory Visit: Payer: Self-pay

## 2021-04-01 VITALS — BP 137/63 | HR 78 | Temp 96.4°F | Resp 19 | Ht 65.0 in | Wt 137.0 lb

## 2021-04-01 DIAGNOSIS — C3411 Malignant neoplasm of upper lobe, right bronchus or lung: Secondary | ICD-10-CM

## 2021-04-01 LAB — CBC WITH DIFFERENTIAL (CANCER CENTER ONLY)
Abs Immature Granulocytes: 0.01 10*3/uL (ref 0.00–0.07)
Basophils Absolute: 0 10*3/uL (ref 0.0–0.1)
Basophils Relative: 1 %
Eosinophils Absolute: 0.2 10*3/uL (ref 0.0–0.5)
Eosinophils Relative: 3 %
HCT: 39.7 % (ref 36.0–46.0)
Hemoglobin: 12.7 g/dL (ref 12.0–15.0)
Immature Granulocytes: 0 %
Lymphocytes Relative: 33 %
Lymphs Abs: 1.9 10*3/uL (ref 0.7–4.0)
MCH: 32.4 pg (ref 26.0–34.0)
MCHC: 32 g/dL (ref 30.0–36.0)
MCV: 101.3 fL — ABNORMAL HIGH (ref 80.0–100.0)
Monocytes Absolute: 0.7 10*3/uL (ref 0.1–1.0)
Monocytes Relative: 11 %
Neutro Abs: 3 10*3/uL (ref 1.7–7.7)
Neutrophils Relative %: 52 %
Platelet Count: 215 10*3/uL (ref 150–400)
RBC: 3.92 MIL/uL (ref 3.87–5.11)
RDW: 15.6 % — ABNORMAL HIGH (ref 11.5–15.5)
WBC Count: 5.8 10*3/uL (ref 4.0–10.5)
nRBC: 0 % (ref 0.0–0.2)

## 2021-04-01 LAB — CMP (CANCER CENTER ONLY)
ALT: 33 U/L (ref 0–44)
AST: 38 U/L (ref 15–41)
Albumin: 3.6 g/dL (ref 3.5–5.0)
Alkaline Phosphatase: 108 U/L (ref 38–126)
Anion gap: 5 (ref 5–15)
BUN: 15 mg/dL (ref 8–23)
CO2: 28 mmol/L (ref 22–32)
Calcium: 8.5 mg/dL — ABNORMAL LOW (ref 8.9–10.3)
Chloride: 104 mmol/L (ref 98–111)
Creatinine: 0.78 mg/dL (ref 0.44–1.00)
GFR, Estimated: >60 mL/min (ref >60)
Glucose, Bld: 111 mg/dL — ABNORMAL HIGH (ref 70–99)
Potassium: 4.1 mmol/L (ref 3.5–5.1)
Sodium: 137 mmol/L (ref 135–145)
Total Bilirubin: 0.9 mg/dL (ref 0.3–1.2)
Total Protein: 6.4 g/dL — ABNORMAL LOW (ref 6.5–8.1)

## 2021-04-01 NOTE — Progress Notes (Signed)
[name] [phone]   [phone]  OFFICE PROGRESS NOTE  Pcp, No No address on file  DIAGNOSIS:  stage IV (T2b, N0, M1 a) non-small cell lung cancer with unclear subtype on the pathology report diagnosed in November 2022 and presented with right upper lobe lung mass as well as right upper lobe nodule in addition to right lower lobe nodule.  DETECTED ALTERATION(S) / BIOMARKER(S) % CFDNA OR AMPLIFICATION ASSOCIATED FDA-APPROVED THERAPIES CLINICAL TRIAL AVAILABILITY [id number] 0.1% None No  PRIOR THERAPY: None  CURRENT THERAPY: None  INTERVAL HISTORY: Amy Sandoval 86 y.o. female returns to the clinic today for follow-up visit accompanied by her son-in-law.  The patient is feeling fine today with no concerning complaints.  She denied having any current chest pain, shortness of breath, cough or hemoptysis.  She denied having any fever or chills.  She has no nausea, vomiting, diarrhea or constipation.  She has no headache or visual changes.  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  She had molecular studies by [employer] that showed no actionable mutations.  The patient is here today for evaluation and discussion of her treatment options.  MEDICAL HISTORY: Past Medical History:  Diagnosis Date   COPD (chronic obstructive pulmonary disease) (HCC)    Dyspnea    GERD (gastroesophageal reflux disease)    Pneumonia     ALLERGIES:  is allergic to penicillins.  MEDICATIONS:  No current outpatient medications on file.   No current facility-administered medications for this visit.    SURGICAL HISTORY:  Past Surgical History:  Procedure Laterality Date   BRONCHIAL BIOPSY  03/09/2021   Procedure: BRONCHIAL BIOPSIES;  Surgeon: Collene Gobble, MD;  Location: [facility] ENDOSCOPY;  Service: Pulmonary;;   BRONCHIAL BRUSHINGS  03/09/2021    Procedure: BRONCHIAL BRUSHINGS;  Surgeon: Collene Gobble, MD;  Location: [facility] ENDOSCOPY;  Service: Pulmonary;;   BRONCHIAL NEEDLE ASPIRATION BIOPSY  03/09/2021   Procedure: BRONCHIAL NEEDLE ASPIRATION BIOPSIES;  Surgeon: Collene Gobble, MD;  Location: [facility] ENDOSCOPY;  Service: Pulmonary;;   DILATION AND CURETTAGE OF UTERUS     EYE SURGERY     bilateral cataract removal   TONSILLECTOMY     VIDEO BRONCHOSCOPY WITH ENDOBRONCHIAL NAVIGATION Bilateral 03/09/2021   Procedure: VIDEO BRONCHOSCOPY WITH ENDOBRONCHIAL NAVIGATION;  Surgeon: Collene Gobble, MD;  Location: [facility] ENDOSCOPY;  Service: Pulmonary;  Laterality: Bilateral;  ION w/ possible fiducial    REVIEW OF SYSTEMS:  Constitutional: positive for fatigue Eyes: negative Ears, nose, mouth, throat, and face: negative Respiratory: negative Cardiovascular: negative Gastrointestinal: negative Genitourinary:negative Integument/breast: negative Hematologic/lymphatic: negative Musculoskeletal:negative Neurological: negative Behavioral/Psych: negative Endocrine: negative Allergic/Immunologic: negative   PHYSICAL EXAMINATION: General appearance: alert, cooperative, fatigued, and no distress Head: Normocephalic, without obvious abnormality, atraumatic Neck: no adenopathy, no JVD, supple, symmetrical, trachea midline, and thyroid not enlarged, symmetric, no tenderness/mass/nodules Lymph nodes: Cervical, supraclavicular, and axillary nodes normal. Resp: clear to auscultation bilaterally Back: symmetric, no curvature. ROM normal. No CVA tenderness. Cardio: regular rate and rhythm, S1, S2 normal, no murmur, click, rub or gallop GI: soft, non-tender; bowel sounds normal; no masses,  no organomegaly Extremities: extremities normal, atraumatic, no cyanosis or edema Neurologic: Alert and oriented X 3, normal strength and tone. Normal symmetric reflexes. Normal coordination and gait  ECOG PERFORMANCE STATUS: 1 - Symptomatic but completely  ambulatory  Blood pressure 137/63, pulse 78, temperature (!)  96.4 F (35.8 C), temperature source Tympanic, resp. rate 19, height 5\' 5"  (1.651 m), weight 137 lb (62.1 kg), SpO2 94 %.  LABORATORY DATA: Lab Results  Component Value Date   WBC 5.8 04/01/2021   HGB 12.7 04/01/2021   HCT 39.7 04/01/2021   MCV 101.3 (H) 04/01/2021   PLT 215 04/01/2021      Chemistry      Component Value Date/Time   NA 142 03/18/2021 1348   K 4.2 03/18/2021 1348   CL 104 03/18/2021 1348   CO2 29 03/18/2021 1348   BUN 15 03/18/2021 1348   CREATININE 0.79 03/18/2021 1348      Component Value Date/Time   CALCIUM 8.8 (L) 03/18/2021 1348   ALKPHOS 117 03/18/2021 1348   AST 36 03/18/2021 1348   ALT 33 03/18/2021 1348   BILITOT 0.5 03/18/2021 1348       RADIOGRAPHIC STUDIES: MR Brain W Wo Contrast  Result Date: 03/18/2021 CLINICAL DATA:  Non-small cell lung cancer, staging EXAM: MRI HEAD WITHOUT AND WITH CONTRAST TECHNIQUE: Multiplanar, multiecho pulse sequences of the brain and surrounding structures were obtained without and with intravenous contrast. CONTRAST:  34mL GADAVIST GADOBUTROL 1 MMOL/ML IV SOLN COMPARISON:  None. FINDINGS: Brain: No enhancing lesions identified. There is an ill-defined hyperintensity in the left medial frontal cortex on FLAIR which did not show abnormal enhancement. Few tiny deep white matter hyperintensities bilaterally do not enhance. Negative for acute infarct or hemorrhage.  Ventricle size normal Vascular: Normal arterial flow voids at the skull base. Skull and upper cervical spine: No skeletal lesion identified. Sinuses/Orbits: Paranasal sinuses clear. Bilateral cataract extraction Other: None IMPRESSION: 1. No enhancing metastatic deposits identified 2. Ill-defined hyperintensity in the left medial frontal cortex measuring approximately 15 mm without enhancement. This could represent a chronic infarct however there is not significant encephalomalacia. Given lack of  enhancement, metastatic disease not felt likely however the FLAIR appearance could be seen with metastatic disease. MRI follow-up recommended in 4 weeks for interval change. Electronically Signed   By: Franchot Gallo M.D.   On: 03/18/2021 15:15   DG Chest Port 1 View  Result Date: 03/09/2021 CLINICAL DATA:  Status post bronchoscopy EXAM: PORTABLE CHEST 1 VIEW COMPARISON:  Prior chest x-ray 12/19/2020 FINDINGS: No evidence of pneumothorax or hemothorax status post bronchoscopy. Stable right apical pulmonary nodule/mass. Unchanged cardiomegaly and mediastinal contours. Pulmonary vascular congestion without overt edema. Bilateral bronchitic changes and mild interstitial prominence are similar. No acute osseous abnormality. IMPRESSION: No evidence of pneumothorax or hemothorax following bronchoscopy. Stable right upper lobe pulmonary nodule/mass. Electronically Signed   By: Jacqulynn Cadet M.D.   On: 03/09/2021 11:39   DG C-Arm 1-60 Min-No Report  Result Date: 03/09/2021 Fluoroscopy was utilized by the requesting physician.  No radiographic interpretation.   DG C-ARM BRONCHOSCOPY  Result Date: 03/09/2021 C-ARM BRONCHOSCOPY: Fluoroscopy was utilized by the requesting physician.  No radiographic interpretation.    ASSESSMENT AND PLAN: This is a very pleasant 86 years old white female with a stage IV (T2b, N0, M1 a) non-small cell lung cancer diagnosed in November 2022 and presented with right upper lobe lung mass and adjacent nodule in addition to synchronous primary versus oligometastatic nodule in the left lower lobe diagnosed and November 2022. The patient had molecular studies that showed no actionable mutations. Her MRI of the brain showed no evidence of metastatic disease to the brain. I had a lengthy discussion with the patient and her son-in-law today.  I recommended for her to  see cardiothoracic surgery for [place] for evaluation of possibility of surgical resection of the right upper lobe followed  by SBRT to the synchronous lesion in the left lower lobe. If the patient is not a good candidate for surgical resection, I will refer her to radiation oncology for consideration of SBRT of the 3 lesion followed by few cycles of systemic chemotherapy. The patient agreed to the current plan. I will see her back for follow-up visit after evaluation by surgery. She was advised to call immediately if she has any other concerning symptoms in the interval. The patient voices understanding of current disease status and treatment options and is in agreement with the current care plan.  All questions were answered. The patient knows to call the clinic with any problems, questions or concerns. We can certainly see the patient much sooner if necessary. The total time spent in the appointment was 30 minutes.  Disclaimer: This note was dictated with voice recognition software. Similar sounding words can inadvertently be transcribed and may not be corrected upon review.

## 2021-04-03 ENCOUNTER — Encounter: Payer: Self-pay | Admitting: *Deleted

## 2021-04-03 NOTE — Progress Notes (Signed)
Oncology Nurse Navigator Documentation  Oncology Nurse Navigator Flowsheets 04/03/2021 03/25/2021 03/19/2021 03/18/2021  Abnormal Finding Date - - - 12/18/2020  Confirmed Diagnosis Date - - - 03/09/2021  Diagnosis Status - - Pathology Pending Pending Molecular Studies  Planned Course of Treatment - - - Radiation;Targeted Therapy;Chemotherapy  Navigator Follow Up Date: - 03/31/2021 03/24/2021 03/23/2021  Navigator Follow Up Reason: - Engineer, manufacturing Location [facility]  Navigator Encounter Type Pathology Review Other: Other: Clinic/MDC;Initial MedOnc  Patient Visit Type Other/I followed up on molecular test result with [employer].  They are completed. I will place results on Dr. Worthy Flank desk.   Other Other Initial;MedOnc  Treatment Phase - - - Pre-Tx/Tx Discussion  Barriers/Navigation Needs Coordination of Care Coordination of Care Coordination of Care Coordination of Care;Education  Education - - - Newly Diagnosed Cancer Education;Other  Interventions Coordination of Care Coordination of Care Coordination of Care Coordination of Care;Psycho-Social Support;Education  Acuity Level 2-Minimal Needs (1-2 Barriers Identified) Level 2-Minimal Needs (1-2 Barriers Identified) Level 2-Minimal Needs (1-2 Barriers Identified) Level 2-Minimal Needs (1-2 Barriers Identified)  Coordination of Care Pathology Pathology Pathology Other  Education Method - - - Verbal;Written  Time Spent with Patient 30 30 15  30

## 2021-04-09 ENCOUNTER — Encounter (HOSPITAL_COMMUNITY): Payer: Self-pay

## 2021-04-09 LAB — GUARDANT 360

## 2021-04-16 ENCOUNTER — Other Ambulatory Visit: Payer: Self-pay

## 2021-04-16 ENCOUNTER — Encounter: Payer: Self-pay | Admitting: Thoracic Surgery (Cardiothoracic Vascular Surgery)

## 2021-04-16 ENCOUNTER — Institutional Professional Consult (permissible substitution) (INDEPENDENT_AMBULATORY_CARE_PROVIDER_SITE_OTHER): Payer: Medicare Other | Admitting: Thoracic Surgery (Cardiothoracic Vascular Surgery)

## 2021-04-16 VITALS — BP 140/68 | HR 79 | Resp 20 | Ht 65.0 in | Wt 137.0 lb

## 2021-04-16 DIAGNOSIS — R911 Solitary pulmonary nodule: Secondary | ICD-10-CM | POA: Diagnosis not present

## 2021-04-17 ENCOUNTER — Encounter: Payer: Medicare Other | Admitting: Thoracic Surgery (Cardiothoracic Vascular Surgery)

## 2021-04-19 NOTE — Progress Notes (Signed)
PCP is Pcp, No Referring Provider is Curt Bears, MD  Chief Complaint  Patient presents with   Lung Lesion    Surgical consult, MR Brain 03/17/21,  PET Scan 02/17/21, Chest CT 02/02/21  Bronch 03/09/21, PFT's 01/26/21      HPI: Amy Sandoval is sent for consultation for possible right upper lobectomy.  Amy Sandoval is an 86 yo woman with a history of tobacco abuse, COPD, and gastroesophageal reflux. She has a greater than 60 pack year history of smoking and continues to smoke but says she is down to a few cigarettes a day.  In August she was having issues with shortness of breath. A CXR  showed right lung mass.  A CT showed a 3.9 x 2.6 cm right upper lobe nodule and a 2.8 x 1.9 cm second mass in the upper lobe.  There was also a left lower lobe nodule, 1.5 x 1.6 cm. There was a small right effusion.  Thoracentesis drained 450 ml of fluid, cytology was negative.  PET CT showed all 3 lung lesions were hypermetabolic with no adenopathy or distant mets.   Dr. Lamonte Sakai did a navigational bronchoscopy. Biopsies showed non-small cell carcinoma.  MR did not show any definite met but there was a lesion that needed a follow up at one month (about now).  She remains active and still works outside the home. She says her breathing has improved since August. Still smoking a "few" cigarettes a day. No change in appetite or weight loss.  Past Medical History:  Diagnosis Date   COPD (chronic obstructive pulmonary disease) (HCC)    Dyspnea    GERD (gastroesophageal reflux disease)    Pneumonia     Past Surgical History:  Procedure Laterality Date   BRONCHIAL BIOPSY  03/09/2021   Procedure: BRONCHIAL BIOPSIES;  Surgeon: Collene Gobble, MD;  Location: [facility] ENDOSCOPY;  Service: Pulmonary;;   BRONCHIAL BRUSHINGS  03/09/2021   Procedure: BRONCHIAL BRUSHINGS;  Surgeon: Collene Gobble, MD;  Location: [facility] ENDOSCOPY;  Service: Pulmonary;;   BRONCHIAL NEEDLE ASPIRATION BIOPSY  03/09/2021   Procedure: BRONCHIAL  NEEDLE ASPIRATION BIOPSIES;  Surgeon: Collene Gobble, MD;  Location: [facility] ENDOSCOPY;  Service: Pulmonary;;   DILATION AND CURETTAGE OF UTERUS     EYE SURGERY     bilateral cataract removal   TONSILLECTOMY     VIDEO BRONCHOSCOPY WITH ENDOBRONCHIAL NAVIGATION Bilateral 03/09/2021   Procedure: VIDEO BRONCHOSCOPY WITH ENDOBRONCHIAL NAVIGATION;  Surgeon: Collene Gobble, MD;  Location: [facility] ENDOSCOPY;  Service: Pulmonary;  Laterality: Bilateral;  ION w/ possible fiducial    Family History  Problem Relation Age of Onset   Hypertension Other     Social History Social History   Tobacco Use   Smoking status: Some Days    Packs/day: 1.00    Years: 60.00    Pack years: 60.00    Types: Cigarettes    Passive exposure: Past   Smokeless tobacco: Never  Vaping Use   Vaping Use: Former   Substances: Nicotine, Flavoring  Substance Use Topics   Alcohol use: Yes    Comment: Social   Drug use: Never    No current outpatient medications on file.   No current facility-administered medications for this visit.    Allergies  Allergen Reactions   Penicillins Rash    Review of Systems  Constitutional:  Negative for activity change, appetite change and unexpected weight change.  HENT:  Negative for trouble swallowing and voice change.   Respiratory:  Positive for  shortness of breath. Negative for choking and wheezing.   Cardiovascular:  Negative for chest pain and leg swelling.  Gastrointestinal:  Positive for abdominal pain.  Genitourinary:  Negative for difficulty urinating and dyspareunia.  Musculoskeletal:  Negative for arthralgias.  Neurological:  Negative for seizures and weakness.  All other systems reviewed and are negative.  BP 140/68    Pulse 79    Resp 20    Ht '5\' 5"'  (1.651 m)    Wt 137 lb (62.1 kg)    LMP  (LMP Unknown)    SpO2 90%    BMI 22.80 kg/m  Physical Exam Vitals reviewed.  Constitutional:      General: She is not in acute distress. HENT:     Head: Normocephalic and  atraumatic.  Eyes:     General: No scleral icterus.    Extraocular Movements: Extraocular movements intact.  Cardiovascular:     Rate and Rhythm: Normal rate and regular rhythm.     Heart sounds: Normal heart sounds.  Pulmonary:     Effort: No respiratory distress.     Breath sounds: No wheezing or rales.     Comments: Diminished breath sounds bilaterally Abdominal:     General: There is no distension.     Palpations: Abdomen is soft.  Skin:    General: Skin is warm and dry.  Neurological:     General: No focal deficit present.     Mental Status: She is alert and oriented to person, place, and time.     Cranial Nerves: No cranial nerve deficit.     Motor: No weakness.     Diagnostic Tests: CT CHEST WITHOUT CONTRAST   TECHNIQUE: Multidetector CT imaging of the chest was performed using thin slice collimation for electromagnetic bronchoscopy planning purposes, without intravenous contrast.   COMPARISON:  12/18/2020.   FINDINGS: Cardiovascular: Atherosclerotic calcification of the aorta, aortic valve and coronary arteries. Heart is enlarged. No pericardial effusion.   Mediastinum/Nodes: No pathologically enlarged mediastinal or axillary lymph nodes. Hilar regions are difficult to definitively evaluate without IV contrast. Esophagus is grossly unremarkable.   Lungs/Pleura: Masslike areas of consolidation in the apical segment right upper lobe measure 2.9 x 4.4 cm and 1.6 x 2.8 cm, respectively, as on 12/18/2020. Centrilobular emphysema. Left apical pleuroparenchymal scarring. Spiculated left lower lobe nodule measures 1.7 x 1.8 cm (8/81), unchanged. No pleural fluid. Airway is unremarkable.   Upper Abdomen: Visualized portions of the liver and right adrenal gland are unremarkable. There may be thickening of the left adrenal gland. Visualized portion of the right kidney is unremarkable. 1.4 cm fluid density lesion in the left kidney, likely a cyst. Visualized portions  of the spleen, pancreas, stomach and bowel are grossly unremarkable.   Musculoskeletal: Degenerative changes in the spine. No worrisome lytic or sclerotic lesions.   IMPRESSION: 1. Spiculated left lower lobe nodule is highly worrisome for primary bronchogenic carcinoma. 2. Masslike areas of consolidation in the right upper lobe are also worrisome for adenocarcinoma. 3. Consider PET in further evaluation, as clinically indicated. 4. Aortic atherosclerosis (ICD10-I70.0). Coronary artery calcification. 5.  Emphysema (ICD10-J43.9).     Electronically Signed   By: Lorin Picket M.D.   On: 02/03/2021 14:09 NUCLEAR MEDICINE PET SKULL BASE TO THIGH   TECHNIQUE: 6.4 mCi F-18 FDG was injected intravenously. Full-ring PET imaging was performed from the skull base to thigh after the radiotracer. CT data was obtained and used for attenuation correction and anatomic localization.   Fasting blood glucose: 76  mg/dl   COMPARISON:  Chest CT 02/02/2021   FINDINGS: Mediastinal blood pool activity: SUV max 2.3   Liver activity: SUV max NA   NECK: No significant abnormal hypermetabolic activity in this region.   Incidental CT findings: Bilateral common carotid atherosclerotic calcification.   CHEST: The anterior mass in the right lung apex measuring 4.2 by 2.9 cm on image 11 series 8 has a maximum SUV of 2.8.   The medial nodular portion of the posterior mass in the apical segment right upper lobe measures 1.8 by 1.5 cm on image 15 of series 8 and has maximum SUV of 7.0. The more posterior portion of this lesion measures 2.1 by 1.0 cm and this posterior portion has a maximum SUV of 2.4.   The 2.0 by 1.6 cm nodule in the left lower lobe on image 38 series 8 has a maximum SUV of 6.3.   Linear nodularity laterally at the right lung base for example on image 51 of series 8 is probably a small mucocele and not appreciably hypermetabolic although is below sensitive PET-CT  size thresholds.   No appreciable hypermetabolic adenopathy in the chest.   Incidental CT findings: Centrilobular emphysema. Coronary, aortic arch, and branch vessel atherosclerotic vascular disease. Moderate cardiomegaly.   ABDOMEN/PELVIS: No significant abnormal hypermetabolic activity in this region.   Incidental CT findings: Atherosclerosis is present, including aortoiliac atherosclerotic disease. Left-sided infrarenal IVC, a venous variant. Sigmoid colon diverticulosis.   SKELETON: No significant abnormal hypermetabolic activity in this region.   Incidental CT findings: none   IMPRESSION: 1. The 2.0 by 1.6 cm left lower lobe nodule has a maximum SUV of 6.3, high suspicion for malignancy. 2. The medial nodular portion of the more posterior of the right upper lobe mass is has a maximum SUV of 7.0, high suspicion for malignancy. The less nodular and more lateral component of this lesion has a maximum SUV of 2.4. 3. The more anterior of the right apical masses, has maximum SUV of 2.8, still suspicious for potential malignancy given the morphology. 4. No current hypermetabolic adenopathy or distant metastatic lesions identified. 5. Other imaging findings of potential clinical significance: Aortic Atherosclerosis (ICD10-I70.0) and Emphysema (ICD10-J43.9). Coronary atherosclerosis. Infrarenal left-sided IVC, a venous variant. Sigmoid colon diverticulosis.     Electronically Signed   By: Van Clines M.D.   On: 02/18/2021 15:08  I personally reviewed the Ct and PET images. There are 2 right upper lobe masses and a left lower lobe mass that are all hypermetabolic.  Pulmonary function testing FVC = 1.96 (76%) FEV1= 1.09 (57%), no change with bronchodilators DLCO=  13.14 (68%)    Impression: Amy Sandoval is an 86 year old woman with a history of tobacco abuse, COPD, and gastroesophageal reflux. She has recently been found to have non-small cell carcinoma of the  right upper lobe. Clinical stage is IV (T3,N0,M1).  Possible that the left lower lobe nodule is a synchronous primary, but unfortunately was not biopsied.  She is elderly at 34 but still pretty functional. Unfortunately she continues to smoke and has significant COPD with FEV1 of 1.09.  In my opinion she is unlikely to tolerate a right upper lobectomy and radiation to the left lower lobe.  Plan:  Follow up with Dr. Julien Nordmann  I spent over 30 minutes in review of records, images and in consultation with Amy Sandoval on 04/16/2021 Melrose Nakayama, MD [facility] [phone]

## 2021-04-20 ENCOUNTER — Encounter (HOSPITAL_COMMUNITY): Payer: Self-pay

## 2021-05-15 ENCOUNTER — Encounter: Payer: Self-pay | Admitting: *Deleted

## 2021-05-15 NOTE — Progress Notes (Signed)
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  I called patient and she was not sure when Dr. Julien Nordmann wanted to see her back. I stated I will follow up with Dr.  Julien Nordmann and updated her on Monday.  She was thankful for the call.  Coordination of Care Coordination of Care Coordination of Care Coordination of Care;Education  Education - - - - Newly Diagnosed Cancer Education;Other  Interventions Coordination of Care Coordination of Care Coordination of Care Coordination of Care Coordination of Care;Psycho-Social Support;Education  Acuity Level 2-Minimal Needs (1-2 Barriers Identified) Level 2-Minimal Needs (1-2 Barriers Identified) Level 2-Minimal Needs (1-2 Barriers Identified) Level 2-Minimal Needs (1-2 Barriers Identified) Level 2-Minimal Needs (1-2 Barriers Identified)  Coordination of Care Other Pathology Pathology Pathology Other  Education Method - - - - Verbal;Written  Time Spent with Patient 30 30 30 15  30

## 2021-05-18 ENCOUNTER — Telehealth: Payer: Self-pay | Admitting: Internal Medicine

## 2021-05-18 ENCOUNTER — Encounter: Payer: Self-pay | Admitting: *Deleted

## 2021-05-18 DIAGNOSIS — C3411 Malignant neoplasm of upper lobe, right bronchus or lung: Secondary | ICD-10-CM

## 2021-05-18 NOTE — Progress Notes (Signed)
Oncology Nurse Navigator Documentation  Oncology Nurse Navigator Flowsheets 05/18/2021 05/15/2021 04/03/2021 03/25/2021 03/19/2021 03/18/2021  Abnormal Finding Date - - - - - 12/18/2020  Confirmed Diagnosis Date - - - - - 03/09/2021  Diagnosis Status - - - - Pathology Pending Pending Molecular Studies  Planned Course of Treatment - - - - - Radiation;Targeted Therapy;Chemotherapy  Navigator Follow Up Date: 05/20/2021 05/18/2021 - 03/31/2021 03/24/2021 03/23/2021  Navigator Follow Up Reason: Appointment Review Appointment Review - Engineer, manufacturing Location [facility]  Navigator Encounter Type Other: Appt/Treatment Plan Review;Telephone Pathology Review Other: Other: Clinic/MDC;Initial MedOnc  Telephone - Outgoing Call - - - -  Patient Visit Type - - Other Other Other Initial;MedOnc  Treatment Phase - - - - - Pre-Tx/Tx Discussion  Barriers/Navigation Needs Coordination of Care/I received a message from Dr. Julien Nordmann that he would like to see Ms. Pomerleau this week to discuss plan of care. I updated scheduling team to call her to be seen on 1/19.   Coordination of Care Coordination of Care Coordination of Care Coordination of Care Coordination of Care;Education  Education - - - - - Newly Diagnosed Cancer Education;Other  Interventions Coordination of Care Coordination of Care Coordination of Care Coordination of Care Coordination of Care Coordination of Care;Psycho-Social Support;Education  Acuity Level 2-Minimal Needs (1-2 Barriers Identified) Level 2-Minimal Needs (1-2 Barriers Identified) Level 2-Minimal Needs (1-2 Barriers Identified) Level 2-Minimal Needs (1-2 Barriers Identified) Level 2-Minimal Needs (1-2 Barriers Identified) Level 2-Minimal Needs (1-2 Barriers Identified)  Coordination of Care Other Other Pathology Pathology Pathology Other  Education Method - - - -  - Verbal;Written  Time Spent with Patient 30 30 30 30 15  30

## 2021-05-18 NOTE — Telephone Encounter (Signed)
Sch per 1/13 inbasket, pt aware

## 2021-05-21 ENCOUNTER — Inpatient Hospital Stay: Payer: Medicare Other | Attending: Internal Medicine

## 2021-05-21 ENCOUNTER — Other Ambulatory Visit: Payer: Self-pay

## 2021-05-21 ENCOUNTER — Inpatient Hospital Stay (HOSPITAL_BASED_OUTPATIENT_CLINIC_OR_DEPARTMENT_OTHER): Payer: Medicare Other | Admitting: Internal Medicine

## 2021-05-21 VITALS — BP 124/60 | HR 74 | Temp 97.4°F | Resp 19 | Ht 65.0 in | Wt 139.8 lb

## 2021-05-21 DIAGNOSIS — C3411 Malignant neoplasm of upper lobe, right bronchus or lung: Secondary | ICD-10-CM | POA: Insufficient documentation

## 2021-05-21 DIAGNOSIS — C3490 Malignant neoplasm of unspecified part of unspecified bronchus or lung: Secondary | ICD-10-CM | POA: Diagnosis not present

## 2021-05-21 LAB — CBC WITH DIFFERENTIAL (CANCER CENTER ONLY)
Abs Immature Granulocytes: 0.01 10*3/uL (ref 0.00–0.07)
Basophils Absolute: 0.1 10*3/uL (ref 0.0–0.1)
Basophils Relative: 1 %
Eosinophils Absolute: 0.2 10*3/uL (ref 0.0–0.5)
Eosinophils Relative: 3 %
HCT: 47.9 % — ABNORMAL HIGH (ref 36.0–46.0)
Hemoglobin: 15.6 g/dL — ABNORMAL HIGH (ref 12.0–15.0)
Immature Granulocytes: 0 %
Lymphocytes Relative: 42 %
Lymphs Abs: 2.8 10*3/uL (ref 0.7–4.0)
MCH: 34.7 pg — ABNORMAL HIGH (ref 26.0–34.0)
MCHC: 32.6 g/dL (ref 30.0–36.0)
MCV: 106.4 fL — ABNORMAL HIGH (ref 80.0–100.0)
Monocytes Absolute: 0.7 10*3/uL (ref 0.1–1.0)
Monocytes Relative: 11 %
Neutro Abs: 3 10*3/uL (ref 1.7–7.7)
Neutrophils Relative %: 43 %
Platelet Count: 183 10*3/uL (ref 150–400)
RBC: 4.5 MIL/uL (ref 3.87–5.11)
RDW: 12.6 % (ref 11.5–15.5)
WBC Count: 6.8 10*3/uL (ref 4.0–10.5)
nRBC: 0 % (ref 0.0–0.2)

## 2021-05-21 LAB — CMP (CANCER CENTER ONLY)
ALT: 21 U/L (ref 0–44)
AST: 29 U/L (ref 15–41)
Albumin: 3.9 g/dL (ref 3.5–5.0)
Alkaline Phosphatase: 105 U/L (ref 38–126)
Anion gap: 3 — ABNORMAL LOW (ref 5–15)
BUN: 15 mg/dL (ref 8–23)
CO2: 33 mmol/L — ABNORMAL HIGH (ref 22–32)
Calcium: 8.9 mg/dL (ref 8.9–10.3)
Chloride: 106 mmol/L (ref 98–111)
Creatinine: 0.74 mg/dL (ref 0.44–1.00)
GFR, Estimated: >60 mL/min (ref >60)
Glucose, Bld: 110 mg/dL — ABNORMAL HIGH (ref 70–99)
Potassium: 4.5 mmol/L (ref 3.5–5.1)
Sodium: 142 mmol/L (ref 135–145)
Total Bilirubin: 0.3 mg/dL (ref 0.3–1.2)
Total Protein: 6.7 g/dL (ref 6.5–8.1)

## 2021-05-21 NOTE — Progress Notes (Signed)
[place] [phone]) [phone]   Fax:([phone]) [phone]  OFFICE PROGRESS NOTE  Pcp, No No address on file  DIAGNOSIS:  stage IV (T2b, N0, M1 a) non-small cell lung cancer with unclear subtype on the pathology report diagnosed in November 2022 and presented with right upper lobe lung mass as well as right upper lobe nodule in addition to right lower lobe nodule.  DETECTED ALTERATION(S) / BIOMARKER(S) % CFDNA OR AMPLIFICATION ASSOCIATED FDA-APPROVED THERAPIES CLINICAL TRIAL AVAILABILITY [id number] 0.1% None No  PRIOR THERAPY: None  CURRENT THERAPY: Consideration of SBRT to the multiple pulmonary nodules  INTERVAL HISTORY: Amy Sandoval 87 y.o. female returns to the clinic today for follow-up visit accompanied by her daughter.  The patient is feeling fine today with no concerning complaints except for mild shortness of breath with exertion.  She denied having any current chest pain, cough or hemoptysis.  She denied having any fever or chills.  She has no nausea, vomiting, diarrhea or constipation.  She has no headache or visual changes.  She has no significant weight loss or night sweats.  She was seen by Dr. Roxan Hockey for discussion of surgical resection but the patient is not a great candidate for surgery.  She is here today for reevaluation and discussion of her treatment options.   MEDICAL HISTORY: Past Medical History:  Diagnosis Date   COPD (chronic obstructive pulmonary disease) (HCC)    Dyspnea    GERD (gastroesophageal reflux disease)    Pneumonia     ALLERGIES:  is allergic to penicillins.  MEDICATIONS:  No current outpatient medications on file.   No current facility-administered medications for this visit.    SURGICAL HISTORY:  Past Surgical History:  Procedure Laterality Date   BRONCHIAL BIOPSY  03/09/2021   Procedure: BRONCHIAL BIOPSIES;  Surgeon: Collene Gobble, MD;  Location: [facility] ENDOSCOPY;  Service: Pulmonary;;   BRONCHIAL BRUSHINGS   03/09/2021   Procedure: BRONCHIAL BRUSHINGS;  Surgeon: Collene Gobble, MD;  Location: [facility] ENDOSCOPY;  Service: Pulmonary;;   BRONCHIAL NEEDLE ASPIRATION BIOPSY  03/09/2021   Procedure: BRONCHIAL NEEDLE ASPIRATION BIOPSIES;  Surgeon: Collene Gobble, MD;  Location: [facility] ENDOSCOPY;  Service: Pulmonary;;   DILATION AND CURETTAGE OF UTERUS     EYE SURGERY     bilateral cataract removal   TONSILLECTOMY     VIDEO BRONCHOSCOPY WITH ENDOBRONCHIAL NAVIGATION Bilateral 03/09/2021   Procedure: VIDEO BRONCHOSCOPY WITH ENDOBRONCHIAL NAVIGATION;  Surgeon: Collene Gobble, MD;  Location: [place] ENDOSCOPY;  Service: Pulmonary;  Laterality: Bilateral;  ION w/ possible fiducial    REVIEW OF SYSTEMS:  Constitutional: positive for fatigue Eyes: negative Ears, nose, mouth, throat, and face: negative Respiratory: positive for dyspnea on exertion Cardiovascular: negative Gastrointestinal: negative Genitourinary:negative Integument/breast: negative Hematologic/lymphatic: negative Musculoskeletal:negative Neurological: negative Behavioral/Psych: negative Endocrine: negative Allergic/Immunologic: negative   PHYSICAL EXAMINATION: General appearance: alert, cooperative, fatigued, and no distress Head: Normocephalic, without obvious abnormality, atraumatic Neck: no adenopathy, no JVD, supple, symmetrical, trachea midline, and thyroid not enlarged, symmetric, no tenderness/mass/nodules Lymph nodes: Cervical, supraclavicular, and axillary nodes normal. Resp: clear to auscultation bilaterally Back: symmetric, no curvature. ROM normal. No CVA tenderness. Cardio: regular rate and rhythm, S1, S2 normal, no murmur, click, rub or gallop GI: soft, non-tender; bowel sounds normal; no masses,  no organomegaly Extremities: extremities normal, atraumatic, no cyanosis or edema Neurologic: Alert and oriented X 3, normal strength and tone. Normal symmetric reflexes. Normal coordination and gait  ECOG PERFORMANCE STATUS: 1 -  Symptomatic but completely ambulatory  Blood pressure 124/60, pulse 74, temperature (!) 97.4 F (36.3 C), temperature source Tympanic, resp. rate 19, height 5\' 5"  (1.651 m), weight 139 lb 12.8 oz (63.4 kg), SpO2 93 %.  LABORATORY DATA: Lab Results  Component Value Date   WBC 6.8 05/21/2021   HGB 15.6 (H) 05/21/2021   HCT 47.9 (H) 05/21/2021   MCV 106.4 (H) 05/21/2021   PLT 183 05/21/2021      Chemistry      Component Value Date/Time   NA 137 04/01/2021 0812   K 4.1 04/01/2021 0812   CL 104 04/01/2021 0812   CO2 28 04/01/2021 0812   BUN 15 04/01/2021 0812   CREATININE 0.78 04/01/2021 0812      Component Value Date/Time   CALCIUM 8.5 (L) 04/01/2021 0812   ALKPHOS 108 04/01/2021 0812   AST 38 04/01/2021 0812   ALT 33 04/01/2021 0812   BILITOT 0.9 04/01/2021 0812       RADIOGRAPHIC STUDIES: No results found.  ASSESSMENT AND PLAN: This is a very pleasant 87 years old white female with a stage IV (T2b, N0, M1 a) non-small cell lung cancer diagnosed in November 2022 and presented with right upper lobe lung mass and adjacent nodule in addition to synchronous primary versus oligometastatic nodule in the left lower lobe diagnosed and November 2022. The patient had molecular studies that showed no actionable mutations. Her MRI of the brain showed no evidence of metastatic disease to the brain. The patient was seen by Dr. Roxan Hockey for discussion of surgical resection but she is not a great candidate for surgery. I had a lengthy discussion with the patient and her daughter today about her current condition and treatment options. I recommended for the patient to see radiation oncology for consideration of SBRT to the multiple pulmonary nodules. I will see the patient back for follow-up visit in 4 months for evaluation and repeat CT scan of the chest for restaging of her disease. The patient is in agreement with the current plan. She was advised to call immediately if she has any  other concerning symptoms in the interval. The patient voices understanding of current disease status and treatment options and is in agreement with the current care plan.  All questions were answered. The patient knows to call the clinic with any problems, questions or concerns. We can certainly see the patient much sooner if necessary. The total time spent in the appointment was 35 minutes.  Disclaimer: This note was dictated with voice recognition software. Similar sounding words can inadvertently be transcribed and may not be corrected upon review.

## 2021-05-22 NOTE — Progress Notes (Signed)
Location of tumor and Histology per Pathology Report: right upper lobe lung, left lower lobe lung  Biopsy:  A. LUNG, RUL TARGET 1, FINE NEEDLE ASPIRATION:  - Malignant cells consistent with adenocarcinoma   B. LUNG, RUL TARGET 1, BRUSHING:  - Malignant cells consistent with adenocarcinoma   C. LUNG, RUL TARGET 2, BRUSHING:  - Malignant cells consistent with adenocarcinoma   D. LUNG, RUL TARGET 2, FINE NEEDLE ASPIRATION:  - Malignant cells consistent with adenocarcinoma  E. LUNG, LLL TARGET 3, BRUSHING:  - Malignant cells consistent with non-small cell carcinoma   F. LUNG, LLL TARGET 3, FINE NEEDLE ASPRATION:  - Malignant cells consistent with non-small cell carcinoma   Past/Anticipated interventions by surgeon, if any:   Surgeon: Baltazar Apo Operation: Flexible video fiberoptic bronchoscopy with robotic assistance and biopsies.  She was seen by Dr. Roxan Hockey for discussion of surgical resection but the patient is not a great candidate for surgery  Past/Anticipated interventions by medical oncology, if any: Dr Julien Nordmann I recommended for the patient to see radiation oncology for consideration of SBRT to the multiple pulmonary nodules.   Pain issues, if any:  no   SAFETY ISSUES: Prior radiation? no Pacemaker/ICD? no Possible current pregnancy? no Is the patient on methotrexate? no  Current Complaints / other details:  shortness of breath with exertion, occasional dry cough without hemoptysis     Vitals:   05/25/21 0849  BP: 137/74  Pulse: 68  Resp: 20  Temp: (!) 97.4 F (36.3 C)  SpO2: 97%  Weight: 140 lb 9.6 oz (63.8 kg)  Height: 5\' 5"  (1.651 m)

## 2021-05-24 NOTE — Progress Notes (Signed)
Radiation Oncology         [phone] ________________________________  Initial Outpatient Consultation  Name: Amy Sandoval MRN: [id number]  Date: 05/25/2021  DOB: Feb 27, 1935  CC:Pcp, No  Curt Bears, MD   REFERRING PHYSICIAN: Curt Bears, MD  DIAGNOSIS: The encounter diagnosis was Primary non-small cell carcinoma of upper lobe of right lung ([place]).  Adenocarcinoma of the RUL, and non-small cell carcinoma of the LLL    HISTORY OF PRESENT ILLNESS::Amy Sandoval is a 87 y.o. female current smoker, with a 60 pack year smoking history. Today, she is accompanied by daughter who normally lives in [place]. she is seen as a courtesy of Dr. Julien Nordmann for an opinion concerning radiation therapy as part of management for her recently diagnosed lung cancer.   The patient presented to the [facility] ED on 12/18/20 with shortness of breath. Upon evaluation, she was found to be hypoxic and was admitted with acute respiratory failure, multifocal pneumonia, pleural effusions, heart failure, COPD, and emphysema. Chest XR performed during ED course showed a 4.3 cm mass lesion in the right lung apex, hyperexpansion with bibasilar atelectasis or infiltrate, and small bilateral pleural effusions. CTA of the chest again demonstrated the right upper lobe mass-like opacity, as well as additional large nodular opacities in the right upper lobe and left lower lobe. Differentials included multifocal infectious processes (including fungal etiologies) versus primary lung neoplasm.   While admitted, she underwent right thoracentesis on 12/19/20 which yielded 450 mL of pleural fluid. Cytology from thoracentesis revealed reactive mesothelial cells present.  Follow discharge on 12/21/20, the patient met with Eric Form, FNP, at [facility] on 01/14/21 for further evaluation. During this visit, the patient reported feeling better, though noted occasional drops in O2 saturation (O2 sat at 86% on room air  during this visit following removal of supplemental O2). The patient was reminded on the importance of wearing her oxygen at all times, and was given Trelegy.   Chest CT on 02/02/21 demonstrated the spiculated left lower lobe nodule as highly worrisome for primary bronchogenic carcinoma. The mass-like areas of consolidation in the right upper lobe were also appreciated as worrisome for adenocarcinoma  During follow up with Eric Form, FNP on 02/04/21, the patient was noted have gained 10 pounds since her last visit, and reported feeling stronger overall.  PET on 02/17/21 showed: the 2.0 x 1.6 cm left lower lobe nodule with a maximum SUV of 6.3 (again noted as highly suspicious of malignancy); the medial- posterior nodular portion of the right upper lobe mass with a maximum SUV of 7; and anterior right apical masses with a maximum SUV of 2.8, suspicious for potential malignancy. Otherwise, PET imaging showed no evidence of current hypermetabolic adenopathy or distant metastatic lesions.  Subsequently, the patient was referred to Dr. Lamonte Sakai for bronchoscopy and biopsies on 03/09/21. LLL biopsies revealed malignant cells consistent with non-small cell carcinoma, and RUL biopsies revealed malignant cells consistent with adenocarcinoma.  MRI of the brain on 03/17/21 demonstrated no evidence of intracranial metastatic disease. However, an ill-defined hyperintensity in the left medial frontal cortex was appreciated,  measuring approximately 15 mm, and without enhancement. Given lack of enhancement, metastatic disease was noted as unlikely. Follow up MRI was recommended in regards to this finding.  The patient was then referred to Dr. Julien Nordmann on 03/18/21 for further evaluation and treatment recommendations. During this visit, the patient endorsed a mild cough, but denied any significant chest pain, shortness of breath, or hemoptysis. Given lack of identification for the  subtype of lung cancer, Dr. Julien Nordmann  ordered Guardant 360 testing for further characterization prior to deciding on treatment.   Molecular studies reported on 03/28/21 showed no actionable mutations or abnormal findings (TPS 1%, MS-stable) .   Given no evidence of metastatic disease, Dr Julien Nordmann recommended a referral to cardiothoracic surgery for consideration of surgical resection of the right upper lobe, followed by SBRT to the synchronous lesion in the left lower lobe. If the patient is not a candidate for surgical resection, Dr. Julien Nordmann recommends SBRT to the 3 lesions, followed by a few cycles of chemotherapy.  Accordingly, the patient met with Dr. Roxan Hockey on 04/16/21. Given her age, smoking status, and significant COPD, Dr. Roxan Hockey did not recommend right upper lobectomy. Given this, Dr. Julien Nordmann referred the patient to me for consideration of SBRT to the multiple pulmonary nodules.    PREVIOUS RADIATION THERAPY: No  PAST MEDICAL HISTORY:  Past Medical History:  Diagnosis Date   COPD (chronic obstructive pulmonary disease) (HCC)    Dyspnea    GERD (gastroesophageal reflux disease)    Pneumonia     PAST SURGICAL HISTORY: Past Surgical History:  Procedure Laterality Date   BRONCHIAL BIOPSY  03/09/2021   Procedure: BRONCHIAL BIOPSIES;  Surgeon: Collene Gobble, MD;  Location: [facility] ENDOSCOPY;  Service: Pulmonary;;   BRONCHIAL BRUSHINGS  03/09/2021   Procedure: BRONCHIAL BRUSHINGS;  Surgeon: Collene Gobble, MD;  Location: [facility] ENDOSCOPY;  Service: Pulmonary;;   BRONCHIAL NEEDLE ASPIRATION BIOPSY  03/09/2021   Procedure: BRONCHIAL NEEDLE ASPIRATION BIOPSIES;  Surgeon: Collene Gobble, MD;  Location: [facility] ENDOSCOPY;  Service: Pulmonary;;   DILATION AND CURETTAGE OF UTERUS     EYE SURGERY     bilateral cataract removal   TONSILLECTOMY     VIDEO BRONCHOSCOPY WITH ENDOBRONCHIAL NAVIGATION Bilateral 03/09/2021   Procedure: VIDEO BRONCHOSCOPY WITH ENDOBRONCHIAL NAVIGATION;  Surgeon: Collene Gobble, MD;  Location: [place]  ENDOSCOPY;  Service: Pulmonary;  Laterality: Bilateral;  ION w/ possible fiducial    FAMILY HISTORY: Family history significant for mother who had metastatic cancer to the brain.  Father had black lung from working in [place].  She has a sister who had hemochromatosis. Family History  Problem Relation Age of Onset   Hypertension Other     SOCIAL HISTORY:  Social History   Tobacco Use   Smoking status: Some Days    Packs/day: 1.00    Years: 60.00    Pack years: 60.00    Types: Cigarettes    Passive exposure: Past   Smokeless tobacco: Never  Vaping Use   Vaping Use: Former   Substances: Nicotine, Flavoring  Substance Use Topics   Alcohol use: Yes    Comment: Social   Drug use: Never    ALLERGIES:  Allergies  Allergen Reactions   Penicillins Rash    MEDICATIONS:  Current Outpatient Medications  Medication Sig Dispense Refill   naproxen sodium (ALEVE) 220 MG tablet Take 220 mg by mouth as needed.     No current facility-administered medications for this encounter.    REVIEW OF SYSTEMS:  A 10+ POINT REVIEW OF SYSTEMS WAS OBTAINED including neurology, dermatology, psychiatry, cardiac, respiratory, lymph, extremities, GI, GU, musculoskeletal, constitutional, reproductive, HEENT.  She denies any significant cough pain within the chest area or hemoptysis.  She denies any breathing difficulties.  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   PHYSICAL EXAM:  height is _0  (1.651 m) and weight is 140 lb 9.6 oz (  63.8 kg). Her temperature is 97.4 F (36.3 C) (abnormal). Her blood pressure is 137/74 and her pulse is 68. Her respiration is 20 and oxygen saturation is 97%.   General: Alert and oriented, in no acute distress HEENT: Head is normocephalic. Extraocular movements are intact.  Neck: Neck is supple, no palpable cervical or supraclavicular lymphadenopathy. Heart: Regular in rate and rhythm with no murmurs, rubs, or gallops. Chest: Clear to  auscultation bilaterally, with no rhonchi, wheezes, or rales. Abdomen: Soft, nontender, nondistended, with no rigidity or guarding. Extremities: No cyanosis or edema. Lymphatics: see Neck Exam Skin: No concerning lesions. Musculoskeletal: symmetric strength and muscle tone throughout. Neurologic: Cranial nerves II through XII are grossly intact. No obvious focalities. Speech is fluent. Coordination is intact. Psychiatric: Judgment and insight are intact. Affect is appropriate.   ECOG = 1  0 - Asymptomatic (Fully active, able to carry on all predisease activities without restriction)  1 - Symptomatic but completely ambulatory (Restricted in physically strenuous activity but ambulatory and able to carry out work of a light or sedentary nature. For example, light housework, office work)  2 - Symptomatic, <50% in bed during the day (Ambulatory and capable of all self care but unable to carry out any work activities. Up and about more than 50% of waking hours)  3 - Symptomatic, >50% in bed, but not bedbound (Capable of only limited self-care, confined to bed or chair 50% or more of waking hours)  4 - Bedbound (Completely disabled. Cannot carry on any self-care. Totally confined to bed or chair)  5 - Death   Eustace Pen MM, Creech RH, Tormey DC, et al. [id number]). "Toxicity and response criteria of the [facility]". [place] Oncol. 5 (6): 649-55  LABORATORY DATA:  Lab Results  Component Value Date   WBC 6.8 05/21/2021   HGB 15.6 (H) 05/21/2021   HCT 47.9 (H) 05/21/2021   MCV 106.4 (H) 05/21/2021   PLT 183 05/21/2021   NEUTROABS 3.0 05/21/2021   Lab Results  Component Value Date   NA 142 05/21/2021   K 4.5 05/21/2021   CL 106 05/21/2021   CO2 33 (H) 05/21/2021   GLUCOSE 110 (H) 05/21/2021   CREATININE 0.74 05/21/2021   CALCIUM 8.9 05/21/2021      RADIOGRAPHY: No results found.    IMPRESSION: Adenocarcinoma of the RUL, and non-small cell carcinoma of the  LLL  On imaging patient was found to have 2 areas in the right upper lung area.  This was biopsied and confirmed adenocarcinoma.  She also has a lesion in the left lower lobe which is biopsy-proven non-small cell lung cancer.  She has met with Dr. Gorden Harms and is not felt to be a candidate for surgery.  She would be a good candidate for definitive treatment with radiation therapy.  The left lower lung lesion could be treated with SBRT however given the size of the right upper lobe lesions we may not be able to use SBRT but instead ultra hypofractionated radiation therapy over approximately 8-10 treatments.    Today, I talked to the patient and daughter about the findings and work-up thus far.  We discussed the natural history of non-small cell lung cancer and general treatment, highlighting the role of radiotherapy in the management.  We discussed the available radiation techniques, and focused on the details of logistics and delivery.  We reviewed the anticipated acute and late sequelae associated with radiation in this setting.  The patient was encouraged to ask  questions that I answered to the best of my ability.  A patient consent form was discussed and signed.  We retained a copy for our records.  The patient would like to proceed with radiation and will be scheduled for CT simulation.  PLAN: She will return for CT simulation on January 30 with treatments to begin approximately a week later.  Total number of treatments to each area will depend on results of planning but anticipated SBRT 3 fractions directed to the left lower lung lesion in between 5 and 10 fractions directed at the right upper lung lesions.   60 minutes of total time was spent for this patient encounter, including preparation, face-to-face counseling with the patient and coordination of care, physical exam, and documentation of the encounter.   ------------------------------------------------  Blair Promise, PhD, MD  This  document serves as a record of services personally performed by Gery Pray, MD. It was created on his behalf by Roney Mans, a trained medical scribe. The creation of this record is based on the scribe's personal observations and the provider's statements to them. This document has been checked and approved by the attending provider.

## 2021-05-25 ENCOUNTER — Encounter: Payer: Self-pay | Admitting: Radiation Oncology

## 2021-05-25 ENCOUNTER — Ambulatory Visit
Admission: RE | Admit: 2021-05-25 | Discharge: 2021-05-25 | Disposition: A | Discharge status: Home / Self Care | Payer: Medicare Other | Source: Ambulatory Visit | Attending: Radiation Oncology | Admitting: Radiation Oncology

## 2021-05-25 ENCOUNTER — Other Ambulatory Visit: Payer: Self-pay

## 2021-05-25 VITALS — BP 137/74 | HR 68 | Temp 97.4°F | Resp 20 | Ht 65.0 in | Wt 140.6 lb

## 2021-05-25 DIAGNOSIS — F17210 Nicotine dependence, cigarettes, uncomplicated: Secondary | ICD-10-CM | POA: Diagnosis not present

## 2021-05-25 DIAGNOSIS — C3432 Malignant neoplasm of lower lobe, left bronchus or lung: Secondary | ICD-10-CM | POA: Diagnosis present

## 2021-05-25 DIAGNOSIS — J449 Chronic obstructive pulmonary disease, unspecified: Secondary | ICD-10-CM | POA: Insufficient documentation

## 2021-05-25 DIAGNOSIS — Z9981 Dependence on supplemental oxygen: Secondary | ICD-10-CM | POA: Insufficient documentation

## 2021-05-25 DIAGNOSIS — C3411 Malignant neoplasm of upper lobe, right bronchus or lung: Secondary | ICD-10-CM | POA: Diagnosis not present

## 2021-05-25 DIAGNOSIS — K219 Gastro-esophageal reflux disease without esophagitis: Secondary | ICD-10-CM | POA: Insufficient documentation

## 2021-05-25 DIAGNOSIS — J90 Pleural effusion, not elsewhere classified: Secondary | ICD-10-CM | POA: Diagnosis not present

## 2021-05-25 NOTE — Progress Notes (Signed)
See MD note for nursing evaluation. °

## 2021-06-01 ENCOUNTER — Other Ambulatory Visit: Payer: Self-pay

## 2021-06-01 ENCOUNTER — Ambulatory Visit
Admission: RE | Admit: 2021-06-01 | Discharge: 2021-06-01 | Disposition: A | Discharge status: Home / Self Care | Payer: Medicare Other | Source: Ambulatory Visit | Attending: Radiation Oncology | Admitting: Radiation Oncology

## 2021-06-01 DIAGNOSIS — C50412 Malignant neoplasm of upper-outer quadrant of left female breast: Secondary | ICD-10-CM | POA: Insufficient documentation

## 2021-06-01 DIAGNOSIS — C3411 Malignant neoplasm of upper lobe, right bronchus or lung: Secondary | ICD-10-CM

## 2021-06-01 DIAGNOSIS — Z170 Estrogen receptor positive status [ER+]: Secondary | ICD-10-CM | POA: Diagnosis not present

## 2021-06-01 DIAGNOSIS — Z510 Encounter for antineoplastic radiation therapy: Secondary | ICD-10-CM | POA: Insufficient documentation

## 2021-06-11 DIAGNOSIS — C3411 Malignant neoplasm of upper lobe, right bronchus or lung: Secondary | ICD-10-CM | POA: Diagnosis not present

## 2021-06-11 DIAGNOSIS — Z510 Encounter for antineoplastic radiation therapy: Secondary | ICD-10-CM | POA: Insufficient documentation

## 2021-06-15 ENCOUNTER — Other Ambulatory Visit: Payer: Self-pay

## 2021-06-15 ENCOUNTER — Ambulatory Visit
Admission: RE | Admit: 2021-06-15 | Discharge: 2021-06-15 | Disposition: A | Discharge status: Home / Self Care | Payer: Medicare Other | Source: Ambulatory Visit | Attending: Radiation Oncology | Admitting: Radiation Oncology

## 2021-06-15 DIAGNOSIS — Z510 Encounter for antineoplastic radiation therapy: Secondary | ICD-10-CM | POA: Diagnosis not present

## 2021-06-15 DIAGNOSIS — C3411 Malignant neoplasm of upper lobe, right bronchus or lung: Secondary | ICD-10-CM

## 2021-06-16 ENCOUNTER — Ambulatory Visit: Payer: Medicare Other | Admitting: Radiation Oncology

## 2021-06-17 ENCOUNTER — Ambulatory Visit
Admission: RE | Admit: 2021-06-17 | Discharge: 2021-06-17 | Disposition: A | Discharge status: Home / Self Care | Payer: Medicare Other | Source: Ambulatory Visit | Attending: Radiation Oncology | Admitting: Radiation Oncology

## 2021-06-17 DIAGNOSIS — C3411 Malignant neoplasm of upper lobe, right bronchus or lung: Secondary | ICD-10-CM

## 2021-06-17 DIAGNOSIS — Z510 Encounter for antineoplastic radiation therapy: Secondary | ICD-10-CM | POA: Diagnosis not present

## 2021-06-18 ENCOUNTER — Ambulatory Visit: Payer: Medicare Other | Admitting: Radiation Oncology

## 2021-06-19 ENCOUNTER — Ambulatory Visit
Admission: RE | Admit: 2021-06-19 | Discharge: 2021-06-19 | Disposition: A | Discharge status: Home / Self Care | Payer: Medicare Other | Source: Ambulatory Visit | Attending: Radiation Oncology | Admitting: Radiation Oncology

## 2021-06-19 ENCOUNTER — Other Ambulatory Visit: Payer: Self-pay

## 2021-06-19 DIAGNOSIS — Z510 Encounter for antineoplastic radiation therapy: Secondary | ICD-10-CM | POA: Diagnosis not present

## 2021-06-22 ENCOUNTER — Other Ambulatory Visit: Payer: Self-pay

## 2021-06-22 ENCOUNTER — Ambulatory Visit
Admission: RE | Admit: 2021-06-22 | Discharge: 2021-06-22 | Disposition: A | Discharge status: Home / Self Care | Payer: Medicare Other | Source: Ambulatory Visit | Attending: Radiation Oncology | Admitting: Radiation Oncology

## 2021-06-22 DIAGNOSIS — C3411 Malignant neoplasm of upper lobe, right bronchus or lung: Secondary | ICD-10-CM

## 2021-06-22 DIAGNOSIS — Z510 Encounter for antineoplastic radiation therapy: Secondary | ICD-10-CM | POA: Diagnosis not present

## 2021-06-24 ENCOUNTER — Encounter: Payer: Self-pay | Admitting: Radiation Oncology

## 2021-06-24 ENCOUNTER — Ambulatory Visit
Admission: RE | Admit: 2021-06-24 | Discharge: 2021-06-24 | Disposition: A | Discharge status: Home / Self Care | Payer: Medicare Other | Source: Ambulatory Visit | Attending: Radiation Oncology | Admitting: Radiation Oncology

## 2021-06-24 ENCOUNTER — Other Ambulatory Visit: Payer: Self-pay

## 2021-06-24 DIAGNOSIS — C3411 Malignant neoplasm of upper lobe, right bronchus or lung: Secondary | ICD-10-CM

## 2021-06-24 DIAGNOSIS — Z510 Encounter for antineoplastic radiation therapy: Secondary | ICD-10-CM | POA: Diagnosis not present

## 2021-07-16 ENCOUNTER — Encounter: Payer: Self-pay | Admitting: Radiology

## 2021-07-22 ENCOUNTER — Telehealth: Payer: Self-pay | Admitting: *Deleted

## 2021-07-22 NOTE — Telephone Encounter (Signed)
CALLED PATIENT TO RESCHEDULE FU ON 07-23-21 DUE TO DR. KINARD BEING IN THE OR, SPOKE WITH PATIENT AND SHE AGREED TO COME ON 08-13-21 @ 4 PM ?

## 2021-07-23 ENCOUNTER — Ambulatory Visit: Payer: Medicare Other | Admitting: Radiation Oncology

## 2021-08-12 NOTE — Progress Notes (Signed)
?Radiation Oncology         [phone] ?________________________________ ? ?Name: Amy Sandoval MRN: [id number]  ?Date: 08/13/2021  DOB: [birth year] ? ?Follow-Up Visit Note ? ?CC: Pcp, No  Curt Bears, MD ? ?  ICD-10-CM   ?1. 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  ?2. Lung nodule  R91.1   ?  ? ? 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 ?  ?Adenocarcinoma of the RUL, and non-small cell carcinoma of the LLL ? ?Interval Since Last Radiation:  1 month and 22 days  ? ?Intent: Curative ? ?Radiation Treatment Dates: 06/15/2021 through 06/24/2021 ?Site Technique Total Dose (Gy) Dose per Fx (Gy) Completed Fx Beam Energies  ?Lung, Left: Lung_L IMRT 54/54 18 3/3 6XFFF  ?Lung, Right: Lung_R IMRT 50/50 10 5/5 6XFFF  ? ? ?Narrative:  The patient returns today for routine follow-up. The patient tolerated radiation therapy relatively well. On the date of her final treatment, the patient reported mild fatigue, mild occasional dry cough, and mild shortness of breath on exertion. She otherwise denied any other symptoms.     ? ?No significant interval history since the patient was seen in consultation.  ? ?She reports some mild fatigue but no significant changes in her breathing.  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  She denies any significant cough or hemoptysis.                          ? ?Allergies:  is allergic to penicillins. ? ?Meds: ?Current Outpatient Medications  ?Medication Sig Dispense Refill  ? naproxen sodium (ALEVE) 220 MG tablet Take 220 mg by mouth as needed.    ? OXYGEN Inhale 3 L into the lungs as needed. When 02 sats drop below 92    ? ?No current facility-administered medications for this encounter.  ? ? ?Physical Findings: ?The patient is in no acute distress. Patient is alert and oriented. ? height is 5\' 5"  (1.651 m) and weight is 137 lb 9.6 oz (62.4 kg). Her temperature is  97.8 ?F (36.6 ?C). Her blood pressure is 143/75 (abnormal) and her pulse is 97. Her respiration is 20 and oxygen saturation is 91%. .  No significant changes. Lungs are clear to auscultation bilaterally. Heart has regular rate and rhythm. No palpable cervical, supraclavicular, or axillary adenopathy. Abdomen soft, non-tender, normal bowel sounds.  ? ? ?Lab Findings: ?Lab Results  ?Component Value Date  ? WBC 6.8 05/21/2021  ? HGB 15.6 (H) 05/21/2021  ? HCT 47.9 (H) 05/21/2021  ? MCV 106.4 (H) 05/21/2021  ? PLT 183 05/21/2021  ? ? ?Radiographic Findings: ?No results found. ? 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 ?  ?Adenocarcinoma of the RUL, and non-small cell carcinoma of the LLL ? ?The patient is recovering from the effects of radiation.  She overall tolerated this treatment quite well particularly given her age. ? ?Plan: She is scheduled for chest CT scan in May and will follow-up with Dr. Julien Nordmann for exam and review of CT scans.  In light of her close follow-up with Dr. Julien Nordmann I will defer follow-up but would be glad to see her at any time. ? ? ? ?____________________________________ ? ?Blair Promise, PhD, MD ? ?This document serves as a record of services personally performed by  Gery Pray, MD. It was created on his behalf by Roney Mans, a trained medical scribe. The creation of this record is based on the scribe's personal observations and the provider's statements to them. This document has been checked and approved by the attending provider. ? ?

## 2021-08-13 ENCOUNTER — Encounter: Payer: Self-pay | Admitting: Radiation Oncology

## 2021-08-13 ENCOUNTER — Other Ambulatory Visit: Payer: Self-pay

## 2021-08-13 ENCOUNTER — Ambulatory Visit
Admission: RE | Admit: 2021-08-13 | Discharge: 2021-08-13 | Disposition: A | Discharge status: Home / Self Care | Payer: Medicare Other | Source: Ambulatory Visit | Attending: Radiation Oncology | Admitting: Radiation Oncology

## 2021-08-13 VITALS — BP 143/75 | HR 97 | Temp 97.8°F | Resp 20 | Ht 65.0 in | Wt 137.6 lb

## 2021-08-13 DIAGNOSIS — R911 Solitary pulmonary nodule: Secondary | ICD-10-CM

## 2021-08-13 DIAGNOSIS — C3411 Malignant neoplasm of upper lobe, right bronchus or lung: Secondary | ICD-10-CM | POA: Diagnosis not present

## 2021-08-13 DIAGNOSIS — Z923 Personal history of irradiation: Secondary | ICD-10-CM | POA: Insufficient documentation

## 2021-08-13 DIAGNOSIS — C3432 Malignant neoplasm of lower lobe, left bronchus or lung: Secondary | ICD-10-CM | POA: Insufficient documentation

## 2021-08-13 NOTE — Progress Notes (Signed)
Amy Sandoval is here today for follow up post radiation to the lung. ? ?Lung Side: Left and Right, patient  completed treatment on 06/24/21 ? ?Does the patient complain of any of the following: ?Pain: Patient denies pain.  ?Shortness of breath w/wo exertion: Yes, on exertion.  Patient uses oxygen at 3 L v ia [place] as needed when 02 sats are below 92%. ?Cough: Yes, dry cough at time.  ?Hemoptysis: No ?Pain with swallowing: No ?Swallowing/choking concerns: No ?Appetite: Good ?Weight-  ?Wt Readings from Last 3 Encounters:  ?08/13/21 137 lb 9.6 oz (62.4 kg)  ?05/25/21 140 lb 9.6 oz (63.8 kg)  ?05/21/21 139 lb 12.8 oz (63.4 kg)  ? ?Energy Level: continues to have mild fatigue.  ?Post radiation skin Changes: No ? ? ? ?Additional comments if applicable:  ? ?Vitals:  ? 08/13/21 1532  ?BP: (!) 143/75  ?Pulse: 97  ?Resp: 20  ?Temp: 97.8 ?F (36.6 ?C)  ?SpO2: 91%  ?Weight: 137 lb 9.6 oz (62.4 kg)  ?Height: 5\' 5"  (1.651 m)  ?  ?

## 2021-08-17 ENCOUNTER — Telehealth: Payer: Self-pay | Admitting: Radiology

## 2021-08-17 ENCOUNTER — Encounter: Payer: Self-pay | Admitting: Internal Medicine

## 2021-08-17 NOTE — Telephone Encounter (Signed)
Daughter states patient has an itchy area that looks like it is a cigarette burn with redness on her mid/upper right back that is approximately 1 inch in diameter and is an open sore. She also refuses to use her oxygen unless she is in distress. Please advise. ?

## 2021-08-18 ENCOUNTER — Encounter: Payer: Self-pay | Admitting: Radiation Oncology

## 2021-08-18 ENCOUNTER — Other Ambulatory Visit: Payer: Self-pay

## 2021-08-18 ENCOUNTER — Ambulatory Visit
Admission: RE | Admit: 2021-08-18 | Discharge: 2021-08-18 | Disposition: A | Discharge status: Home / Self Care | Payer: Medicare Other | Source: Ambulatory Visit | Attending: Radiation Oncology | Admitting: Radiation Oncology

## 2021-08-18 VITALS — BP 149/71 | HR 87 | Temp 96.9°F | Resp 18 | Ht 65.0 in

## 2021-08-18 DIAGNOSIS — R238 Other skin changes: Secondary | ICD-10-CM

## 2021-08-18 DIAGNOSIS — R911 Solitary pulmonary nodule: Secondary | ICD-10-CM | POA: Diagnosis present

## 2021-08-18 DIAGNOSIS — C3411 Malignant neoplasm of upper lobe, right bronchus or lung: Secondary | ICD-10-CM

## 2021-08-18 MED ORDER — VALACYCLOVIR HCL 1 G PO TABS: 1000.0000 mg | ORAL_TABLET | Freq: Three times a day (TID) | ORAL | 0 refills | Status: DC

## 2021-08-18 NOTE — Progress Notes (Addendum)
Amy Sandoval is here today for follow up post radiation to the lung. ? ?Lung Side: Bilateral ? ?Does the patient complain of any of the following: ?Pain:Occasional "twinge" of pain in upper back ?Shortness of breath w/wo exertion: shortness of breath with exertion ?Cough: denies ?Hemoptysis: denies ?Pain with swallowing: denies ?Swallowing/choking concerns: denies ?Appetite: good ?Energy Level: poor ?Post radiation skin Changes: see below ? ? ? ?Additional comments if applicable: Itching and Rash with blisters on upper left back ? ?Vitals:  ? 08/18/21 1304  ?BP: (!) 149/71  ?Pulse: 87  ?Resp: 18  ?Temp: (!) 96.9 ?F (36.1 ?C)  ?SpO2: 92%  ?Height: 5\' 5"  (1.651 m)  ? ? ?

## 2021-08-18 NOTE — Progress Notes (Signed)
?Radiation Oncology         [phone] ?________________________________ ? ?Name: Amy Sandoval MRN: [id number]  ?Date: 08/18/2021  DOB: [birth year] ? ?Follow-Up Visit Note ? ?CC: Pcp, No  Curt Bears, MD ? ?  ICD-10-CM   ?1. Lung nodule  R91.1   ?  ? ? 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 ?  ?Adenocarcinoma of the RUL, and non-small cell carcinoma of the LLL ? ?Interval Since Last Radiation: 1 month and 27 days  ? ?Intent: Curative ? ?Radiation Treatment Dates: 06/15/2021 through 06/24/2021 ?Site Technique Total Dose (Gy) Dose per Fx (Gy) Completed Fx Beam Energies  ?Lung, Left: Lung_L IMRT 54/54 18 3/3 6XFFF  ?Lung, Right: Lung_R IMRT 50/50 10 5/5 6XFFF  ? ? ?Narrative:  The patient returns today for evaluation of some new sores/rash on her back. She was last seen here for routine one month follow-up on 08/13/21.  ? ?The patient's daughter reached out to [place] yesterday with concerns of a new itchy area on the patient's back, with a pronounced approximately 1 inch sore.  ? ?For the past 24 hours this rash has progressed significantly with vesicular reaction as noted on images below. ? ?Photo's taken by the patient's niece yesterday (08/17/21), located on the mid-upper-left back:  ? ? ? ? ? ?Photo taken today (08/18/21) :  ? ? ? ?Patient was seen in the clinic and the rash had progressed into the left lateral chest region and inferior to the left breast.  Progression of rash is consistent with shingles reaction, following dermatomes.  She complains of itching but no actual burning pain at this time. ?            ? ?        ? ?Allergies:  is allergic to penicillins. ? ?Meds: ?Current Outpatient Medications  ?Medication Sig Dispense Refill  ? naproxen sodium (ALEVE) 220 MG tablet Take 220 mg by mouth as needed.    ? OXYGEN Inhale 3 L into the lungs as needed. When 02 sats drop below 92    ? ?No current facility-administered medications for  this encounter.  ? ? ?Physical Findings: ?The patient is in no acute distress. Patient is alert and oriented. ? height is 5\' 5"  (1.651 m). Her temperature is 96.9 ?F (36.1 ?C) (abnormal). Her blood pressure is 149/71 (abnormal) and her pulse is 87. Her respiration is 18 and oxygen saturation is 92%. .  ? ?Please see images above.  Appropriate gowns/N95 masks  for infectious disease precautions for potential viral infection were taken by staff today ? ?Lab Findings: ?Lab Results  ?Component Value Date  ? WBC 6.8 05/21/2021  ? HGB 15.6 (H) 05/21/2021  ? HCT 47.9 (H) 05/21/2021  ? MCV 106.4 (H) 05/21/2021  ? PLT 183 05/21/2021  ? ? ?Radiographic Findings: ?No results found. ? 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 ?  ?Adenocarcinoma of the RUL, and non-small cell carcinoma of the LLL ? ?Patient's skin  is more consistent with shingles reaction.  She did have viral and bacterial cultures obtained of this vesicular reaction. ? ?Pharmacy was consulted and current recommendations are for valacyclovir 1 g 3 times a day for 7 days.  Prescription called into her pharmacy. ? ?Plan: Treatment plan may change depending on results of cultures as above.  She is scheduled to see Dr. Julien Nordmann in May and will likely  undergo repeat imaging for follow-up of her SBRT treatments directed at the solitary nodules in the left and right lung. ? ? ? ?____________________________________ ? ?Blair Promise, PhD, MD ? ?This document serves as a record of services personally performed by Gery Pray, MD. It was created on his behalf by Roney Mans, a trained medical scribe. The creation of this record is based on the scribe's personal observations and the provider's statements to them. This document has been checked and approved by the attending provider. ? ?

## 2021-08-18 NOTE — Telephone Encounter (Signed)
Called daughter with instructions from Dr Sondra Come. She states the area looks worse now and will send a photo. ?

## 2021-08-20 ENCOUNTER — Telehealth: Payer: Self-pay | Admitting: Radiology

## 2021-08-20 LAB — VARICELLA-ZOSTER BY PCR: Varicella-Zoster, PCR: POSITIVE — AB

## 2021-08-20 NOTE — Telephone Encounter (Signed)
Notified patient of shingles results and to continue antiviral medication. Patient verbalized understanding. ?

## 2021-08-20 NOTE — Telephone Encounter (Signed)
-----   Message from Gery Pray, MD sent at 08/20/2021  2:02 PM EDT ----- ?Regarding: culture ?Please let patient know results of culture and continue on antiviral med.  Thanks, jk ?----- Message ----- ?From: Interface, Lab In [place] ?Sent: 08/18/2021   9:59 PM EDT ?To: Gery Pray, MD ? ? ?

## 2021-08-21 LAB — AEROBIC CULTURE W GRAM STAIN (SUPERFICIAL SPECIMEN): Gram Stain: NONE SEEN

## 2021-09-17 ENCOUNTER — Inpatient Hospital Stay: Payer: Medicare Other | Attending: Internal Medicine

## 2021-09-17 ENCOUNTER — Other Ambulatory Visit: Payer: Self-pay

## 2021-09-17 DIAGNOSIS — C3411 Malignant neoplasm of upper lobe, right bronchus or lung: Secondary | ICD-10-CM | POA: Insufficient documentation

## 2021-09-17 DIAGNOSIS — J432 Centrilobular emphysema: Secondary | ICD-10-CM | POA: Diagnosis not present

## 2021-09-17 DIAGNOSIS — I700 Atherosclerosis of aorta: Secondary | ICD-10-CM | POA: Insufficient documentation

## 2021-09-17 LAB — CMP (CANCER CENTER ONLY)
ALT: 21 U/L (ref 0–44)
AST: 27 U/L (ref 15–41)
Albumin: 3.8 g/dL (ref 3.5–5.0)
Alkaline Phosphatase: 119 U/L (ref 38–126)
Anion gap: 4 — ABNORMAL LOW (ref 5–15)
BUN: 13 mg/dL (ref 8–23)
CO2: 35 mmol/L — ABNORMAL HIGH (ref 22–32)
Calcium: 8.6 mg/dL — ABNORMAL LOW (ref 8.9–10.3)
Chloride: 101 mmol/L (ref 98–111)
Creatinine: 0.69 mg/dL (ref 0.44–1.00)
GFR, Estimated: >60 mL/min (ref >60)
Glucose, Bld: 109 mg/dL — ABNORMAL HIGH (ref 70–99)
Potassium: 4.1 mmol/L (ref 3.5–5.1)
Sodium: 140 mmol/L (ref 135–145)
Total Bilirubin: 0.6 mg/dL (ref 0.3–1.2)
Total Protein: 6.6 g/dL (ref 6.5–8.1)

## 2021-09-17 LAB — CBC WITH DIFFERENTIAL (CANCER CENTER ONLY)
Abs Immature Granulocytes: 0.01 10*3/uL (ref 0.00–0.07)
Basophils Absolute: 0 10*3/uL (ref 0.0–0.1)
Basophils Relative: 1 %
Eosinophils Absolute: 0.1 10*3/uL (ref 0.0–0.5)
Eosinophils Relative: 2 %
HCT: 53.3 % — ABNORMAL HIGH (ref 36.0–46.0)
Hemoglobin: 17.4 g/dL — ABNORMAL HIGH (ref 12.0–15.0)
Immature Granulocytes: 0 %
Lymphocytes Relative: 25 %
Lymphs Abs: 1.3 10*3/uL (ref 0.7–4.0)
MCH: 34.5 pg — ABNORMAL HIGH (ref 26.0–34.0)
MCHC: 32.6 g/dL (ref 30.0–36.0)
MCV: 105.8 fL — ABNORMAL HIGH (ref 80.0–100.0)
Monocytes Absolute: 0.5 10*3/uL (ref 0.1–1.0)
Monocytes Relative: 10 %
Neutro Abs: 3.2 10*3/uL (ref 1.7–7.7)
Neutrophils Relative %: 62 %
Platelet Count: 152 10*3/uL (ref 150–400)
RBC: 5.04 MIL/uL (ref 3.87–5.11)
RDW: 15.6 % — ABNORMAL HIGH (ref 11.5–15.5)
WBC Count: 5 10*3/uL (ref 4.0–10.5)
nRBC: 0 % (ref 0.0–0.2)

## 2021-09-18 ENCOUNTER — Telehealth: Payer: Self-pay | Admitting: Internal Medicine

## 2021-09-18 ENCOUNTER — Ambulatory Visit (HOSPITAL_COMMUNITY)
Admission: RE | Admit: 2021-09-18 | Discharge: 2021-09-18 | Disposition: A | Discharge status: Home / Self Care | Payer: Medicare Other | Source: Ambulatory Visit | Attending: Internal Medicine | Admitting: Internal Medicine

## 2021-09-18 DIAGNOSIS — C3490 Malignant neoplasm of unspecified part of unspecified bronchus or lung: Secondary | ICD-10-CM | POA: Diagnosis present

## 2021-09-18 MED ORDER — IOHEXOL 300 MG/ML  SOLN
75.0000 mL | Freq: Once | INTRAMUSCULAR | Status: AC | PRN
  Administered 2021-09-18: 75 mL via INTRAVENOUS

## 2021-09-18 MED ORDER — SODIUM CHLORIDE (PF) 0.9 % IJ SOLN
INTRAMUSCULAR | Status: AC
Start: 2021-09-18 — End: 2021-09-18
  Filled 2021-09-18: qty 50

## 2021-09-18 NOTE — Telephone Encounter (Signed)
Called patient regarding upcoming appointment, left a voicemail. 

## 2021-09-21 ENCOUNTER — Inpatient Hospital Stay (HOSPITAL_BASED_OUTPATIENT_CLINIC_OR_DEPARTMENT_OTHER): Payer: Medicare Other | Admitting: Internal Medicine

## 2021-09-21 ENCOUNTER — Other Ambulatory Visit: Payer: Self-pay

## 2021-09-21 ENCOUNTER — Encounter: Payer: Self-pay | Admitting: Internal Medicine

## 2021-09-21 VITALS — BP 115/81 | HR 80 | Temp 97.7°F | Resp 16 | Wt 138.2 lb

## 2021-09-21 DIAGNOSIS — C3411 Malignant neoplasm of upper lobe, right bronchus or lung: Secondary | ICD-10-CM

## 2021-09-21 DIAGNOSIS — C3490 Malignant neoplasm of unspecified part of unspecified bronchus or lung: Secondary | ICD-10-CM | POA: Diagnosis not present

## 2021-09-21 NOTE — Progress Notes (Signed)
Amy [phone]   [phone]  OFFICE PROGRESS NOTE  Pcp, No No address on file  DIAGNOSIS:  Stage IV (T2b, N0, M1 a) non-small cell lung cancer with unclear subtype on the pathology report diagnosed in November 2022 and presented with right upper lobe lung mass as well as right upper lobe nodule in addition to right lower lobe nodule.  DETECTED ALTERATION(S) / BIOMARKER(S) % CFDNA OR AMPLIFICATION ASSOCIATED FDA-APPROVED THERAPIES CLINICAL TRIAL AVAILABILITY [id number] 0.1% None No  PRIOR THERAPY: None  CURRENT THERAPY: Consideration of SBRT to the multiple pulmonary nodules  INTERVAL HISTORY: Amy Sandoval 87 y.o. female returns to the clinic today for 6 months follow-up visit accompanied by her husband.  The patient is feeling fine today with no concerning complaints except for fatigue and lack of energy.  She denied having any current chest pain but has shortness of breath with exertion with no cough or hemoptysis.  She has no nausea, vomiting, diarrhea or constipation.  She has no headache or visual changes.  She is currently on observation and feeling fine.  The patient had repeat CT scan of the chest performed recently and she is here for evaluation and discussion of her scan results.   MEDICAL HISTORY: Past Medical History:  Diagnosis Date   COPD (chronic obstructive pulmonary disease) (HCC)    Dyspnea    GERD (gastroesophageal reflux disease)    History of radiation therapy    RUL lung and LLL lung 06/15/2021-06/24/2021  Dr Gery Pray   Pneumonia     ALLERGIES:  is allergic to penicillins.  MEDICATIONS:  Current Outpatient Medications  Medication Sig Dispense Refill   naproxen sodium (ALEVE) 220 MG tablet Take 220 mg by mouth as needed.     OXYGEN Inhale 3 L into the lungs as needed. When 02 sats drop below 92     valACYclovir (VALTREX) 1000 MG tablet Take 1 tablet (1,000 mg total) by mouth 3 (three) times daily. 21  tablet 0   No current facility-administered medications for this visit.    SURGICAL HISTORY:  Past Surgical History:  Procedure Laterality Date   BRONCHIAL BIOPSY  03/09/2021   Procedure: BRONCHIAL BIOPSIES;  Surgeon: Collene Gobble, MD;  [facility] ENDOSCOPY;  Service: Pulmonary;;   BRONCHIAL BRUSHINGS  03/09/2021   Procedure: BRONCHIAL BRUSHINGS;  Surgeon: Collene Gobble, MD;  Location: [facility] ENDOSCOPY;  Service: Pulmonary;;   BRONCHIAL NEEDLE ASPIRATION BIOPSY  03/09/2021   Procedure: BRONCHIAL NEEDLE ASPIRATION BIOPSIES;  Surgeon: Collene Gobble, MD;  Location: [facility] ENDOSCOPY;  Service: Pulmonary;;   DILATION AND CURETTAGE OF UTERUS     EYE SURGERY     bilateral cataract removal   TONSILLECTOMY     VIDEO BRONCHOSCOPY WITH ENDOBRONCHIAL NAVIGATION Bilateral 03/09/2021   Procedure: VIDEO BRONCHOSCOPY WITH ENDOBRONCHIAL NAVIGATION;  Surgeon: Collene Gobble, MD;  Location: [facility] ENDOSCOPY;  Service: Pulmonary;  Laterality: Bilateral;  ION w/ possible fiducial    REVIEW OF SYSTEMS:  A comprehensive review of systems was negative except for: Constitutional: positive for fatigue Respiratory: positive for dyspnea on exertion   PHYSICAL EXAMINATION: General appearance: alert, cooperative, fatigued, and no distress Head: Normocephalic, without obvious abnormality, atraumatic Neck: no adenopathy, no JVD, supple, symmetrical, trachea midline, and thyroid not enlarged, symmetric, no tenderness/mass/nodules Lymph nodes: Cervical, supraclavicular, and axillary nodes normal. Resp: clear to auscultation bilaterally Back: symmetric, no curvature. ROM normal. No CVA tenderness. Cardio: regular rate and rhythm, S1, S2 normal, no murmur,  click, rub or gallop GI: soft, non-tender; bowel sounds normal; no masses,  no organomegaly Extremities: extremities normal, atraumatic, no cyanosis or edema  ECOG PERFORMANCE STATUS: 1 - Symptomatic but completely ambulatory  Blood pressure 115/81, pulse 80,  temperature 97.7 F (36.5 C), temperature source Tympanic, resp. rate 16, weight 138 lb 4 oz (62.7 kg), SpO2 (!) 85 %.  LABORATORY DATA: Lab Results  Component Value Date   WBC 5.0 09/17/2021   HGB 17.4 (H) 09/17/2021   HCT 53.3 (H) 09/17/2021   MCV 105.8 (H) 09/17/2021   PLT 152 09/17/2021      Chemistry      Component Value Date/Time   NA 140 09/17/2021 0858   K 4.1 09/17/2021 0858   CL 101 09/17/2021 0858   CO2 35 (H) 09/17/2021 0858   BUN 13 09/17/2021 0858   CREATININE 0.69 09/17/2021 0858      Component Value Date/Time   CALCIUM 8.6 (L) 09/17/2021 0858   ALKPHOS 119 09/17/2021 0858   AST 27 09/17/2021 0858   ALT 21 09/17/2021 0858   BILITOT 0.6 09/17/2021 0858       RADIOGRAPHIC STUDIES: CT Chest W Contrast  Result Date: 09/19/2021 CLINICAL DATA:  Right upper lobe and left lower lobe lung cancer restaging, status post radiation therapy, smoker * Tracking Code: BO * EXAM: CT CHEST WITH CONTRAST TECHNIQUE: Multidetector CT imaging of the chest was performed during intravenous contrast administration. RADIATION DOSE REDUCTION: This exam was performed according to the departmental dose-optimization program which includes automated exposure control, adjustment of the mA and/or kV according to patient size and/or use of iterative reconstruction technique. CONTRAST:  18mL OMNIPAQUE IOHEXOL 300 MG/ML  SOLN COMPARISON:  PET-CT, 02/17/2021 CT chest, 12/19/2020 FINDINGS: Cardiovascular: Aortic atherosclerosis. Normal heart size. Left coronary artery calcifications. No pericardial effusion. Mediastinum/Nodes: No enlarged mediastinal, hilar, or axillary lymph nodes. Thyroid gland, trachea, and esophagus demonstrate no significant findings. Lungs/Pleura: Mild centrilobular emphysema. Slight interval decrease in size of a spiculated nodule of the left lower lobe, measuring 2.0 x 1.4 cm, previously 2.2 x 1.8 cm (series 5, image 80). Significant interval decrease in size of a lobulated  nodule of the anterior right pulmonary apex, measuring 2.7 x 1.5 cm, previously 4.3 x 2.8 cm (series 5, image 26). The medial, previously FDG avid component of a consolidative nodular opacity just posteriorly in the right apex is slightly diminished in size, measuring 1.5 x 1.4 cm, previously 1.7 x 1.6 cm (series 5, image 31). No pleural effusion or pneumothorax. Upper Abdomen: No acute abnormality. Musculoskeletal: No chest wall abnormality. No suspicious osseous lesions identified. IMPRESSION: 1. Interval decrease in size of a spiculated nodule of the left lower lobe and two lobulated nodules of the right pulmonary apex, consistent with treatment response of multifocal lung malignancy. No new nodules. 2. Emphysema. 3. Coronary artery disease. Aortic Atherosclerosis (ICD10-I70.0) and Emphysema (ICD10-J43.9). Electronically Signed   By: Delanna Ahmadi M.D.   On: 09/19/2021 14:03    ASSESSMENT AND PLAN: This is a very pleasant 87 years old white female with a stage IV (T2b, N0, M1 a) non-small cell lung cancer diagnosed in November 2022 and presented with right upper lobe lung mass and adjacent nodule in addition to synchronous primary versus oligometastatic nodule in the left lower lobe diagnosed and November 2022. The patient had molecular studies that showed no actionable mutations. The patient is currently on observation and she is feeling fine with no concerning complaints. She had repeat CT scan of the chest  performed recently.  I personally and independently reviewed the scan and discussed the result with the patient and her husband. Her scan showed no concerning findings for disease progression. I recommended for her to continue on observation with repeat CT scan of the chest in 6 months. I will also refer the patient to the [facility] residency clinic to establish care with a primary care physician. The patient was advised to call immediately if she has any concerning symptoms in  the interval. The patient voices understanding of current disease status and treatment options and is in agreement with the current care plan.  All questions were answered. The patient knows to call the clinic with any problems, questions or concerns. We can certainly see the patient much sooner if necessary.   Disclaimer: This note was dictated with voice recognition software. Similar sounding words can inadvertently be transcribed and may not be corrected upon review.

## 2021-09-25 ENCOUNTER — Encounter: Payer: Self-pay | Admitting: *Deleted

## 2021-09-25 NOTE — Progress Notes (Signed)
Oncology Nurse Navigator Documentation     09/25/2021    3:00 PM 05/18/2021    8:00 AM 05/15/2021    3:00 PM 04/03/2021    1:00 PM 03/25/2021    2:00 PM 03/19/2021    9:00 AM 03/18/2021    2:00 PM  Oncology Nurse Navigator Flowsheets  Abnormal Finding Date       12/18/2020  Confirmed Diagnosis Date       03/09/2021  Diagnosis Status      Pathology Pending Pending Molecular Studies  Planned Course of Treatment       Radiation;Targeted Therapy;Chemotherapy  Navigator Follow Up Date:  05/20/2021 05/18/2021  03/31/2021 03/24/2021 03/23/2021  Navigator Follow Up Reason:  Appointment Review Appointment Review  Molecular Testing Molecular Testing Molecular Testing  Navigation Complete Date: 09/25/2021        Post Navigation: Continue to Follow Patient? No        Reason Not Navigating Patient: No Treatment, Observation Only        Navigator Location [facility]  Navigator Encounter Type Appt/Treatment Plan Review Other: Appt/Treatment Plan Review;Telephone Pathology Review Other: Other: Clinic/MDC;Initial MedOnc  Telephone   Outgoing Call      Patient Visit Type    Other Other Other Initial;MedOnc  Treatment Phase       Pre-Tx/Tx Discussion  Barriers/Navigation Needs Coordination of Care Coordination of Care Coordination of Care Coordination of Care Coordination of Care Coordination of Care Coordination of Care;Education  Education       Newly Diagnosed Cancer Education;Other  Interventions Coordination of Care/I followed up on Amy Sandoval plan of care. Per notes, patient is on observation at this time.  Coordination of Care Coordination of Care Coordination of Care Coordination of Care Coordination of Care Coordination of Care;Psycho-Social Support;Education  Acuity Level 2-Minimal Needs (1-2 Barriers Identified) Level 2-Minimal Needs (1-2 Barriers Identified) Level 2-Minimal Needs (1-2 Barriers  Identified) Level 2-Minimal Needs (1-2 Barriers Identified) Level 2-Minimal Needs (1-2 Barriers Identified) Level 2-Minimal Needs (1-2 Barriers Identified) Level 2-Minimal Needs (1-2 Barriers Identified)  Coordination of Care Other Other Other Pathology Pathology Pathology Other  Education Method       Verbal;Written  Time Spent with Patient 30 30 30 30 30 15  30

## 2021-11-23 ENCOUNTER — Telehealth: Payer: Self-pay | Admitting: Internal Medicine

## 2021-11-23 NOTE — Telephone Encounter (Signed)
Called patient regarding upcoming November appointments, patient is notified. 

## 2021-11-23 NOTE — Progress Notes (Unsigned)
History of Present Illness Amy Sandoval is a 87 y.o. female  current every day smoker with bilateral PET avid lung nodules, diagnosed as adenocarcinoma and COPD. She has been treated with SBRT and is now in observation phase . Last CT 08/2021 showed no findings concerning for recurranceShe is followed by Dr. Silas Flood and Dr. Valeta Harms.      Synopsis Admission 8/18-8/21/2022  for acute respiratory failure, multifocal pneumonia, pleural effusions , COPD and emphysema., chronic hypercarbia and incidental finding of bilateral pulmonary nodules. She was treated with oxygen, antimicrobial therapy, thoracentesis. ( 450 cc's clear yellow fluid removed) She was discharged home on 4L of oxygen per [place]. She had  a 6 week follow up super D CT Chest to follow up on pulmonary nodules, as there was a mass noted on the CTA she had done as an inpatient. The area was still concerning for primary bronchogenic cancer. She had PET scan that was highly suspicious for malignancy. She had Flexible video fiberoptic bronchoscopy with robotic assistance and biopsies per Dr. Lamonte Sakai on 03/09/2022. Cytology was positive for adenocarcinoma. MTI brain was negative for metastatic disease. She was referred to Medical oncology on 03/18/2021, and was seen by Dr. Earlie Server. Diagnosis was stage IV (T2b, N0, M1 a) non-small cell lung cancer with unclear subtype on the pathology report diagnosed in November 2022 and presented with right upper lobe lung mass as well as right upper lobe nodule in addition to right lower lobe nodule. She was referred to surgery, and Dr. Roxan Hockey saw the patient and felt she was not a surgical candidate. She had also been referred to Radiation oncology and 05/25/2021 and was seen by Dr. Sondra Come. The left lower lung lesion could be treated with SBRT however given the size of the right upper lobe lesions they were unable  to use SBRT but instead ultra hypofractionated radiation therapy over approximately 8-10  treatments.  Molecular studies reported on 03/28/21 showed no actionable mutations or abnormal findings (TPS 1%, MS-stable) .   11/24/2021 Pt. Presents for follow up. She was last seen in the office 02/23/2021. She has subsequently received radiation treatment to her left lower lobe lesion ( SBRT) and RUL lesion ( [facility]) . She is here because she feels she is experiencing worsening dyspnea since her last radiation treatment. Most recent CT Chest 09/18/2021 shows interval decrease in size of nodules after treatment, and no concerning findings of disease progression. Plan is for her to continue in the observation mode with a follow up CT Chest in 6 months.   She presents today not wearing oxygen , with oxygen saturations of 86%. She refused oxygen therapy even when we brought oxygen into the room. After resting she did increase her sats to 89-90% on RA at rest. I was able to talk her into wearing oxygen to determine her  liter requirement. She continues to work. She states she has found she needs more than her 3 L of oxygen to maintain her oxygen sats at > 93% since her last radiation treatment 06/24/2021. She continues to work at her [employer]. She has noticed she is more short of breath with mild exertion. She is now using 5 L at home to get adequate sats. She is having problems getting her oxygen refilled and she would like to change her oxygen supplier. She currently has one small tank and one larger tank. She states her current DME  are unable to resupply her oxygen and they never arrive when they say  they are going to be there. She needs a more reliable supplier.  She has noticed that on her small tank at work her sats were 93% on 3 L, but she needs 5 L at home. In the office her sats got up to 98% on 3 L. We now wonder if this is an issue with her concentrator at home, and the length of tubing she is using. .  She is not using any inhalers at present. She states they are too expensive  and she states she did not notice any difference with use when she had samples. . She does not have Medicare part C. She does not feel she needs medications. I have explained to her that at 41 almost 87 she does need medications, and I have encouraged her to get something to cover medications. She does not want to  fill out paperwork for financial assistance as she does not think she will qualify because she owns 3 homes. She said she is property rich and cash poor. I have still asked her to compete the paperwork. She is not on a rescue inhaler either. We looked up options on [facility]. She is going to look into this, and she will call if she wants me to send in a prescription.  We discussed that it is important to take care of herself. 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   Test Results: 08/2021 CT Chest Interval decrease in size of a spiculated nodule of the left lower lobe and two lobulated nodules of the right pulmonary apex, consistent with treatment response of multifocal lung malignancy. No new nodules. Emphysema. Coronary artery disease.  Cytology 03/09/2021 FINAL MICROSCOPIC DIAGNOSIS:   LUNG, RUL TARGET 1, FINE NEEDLE ASPIRATION:  - Malignant cells consistent with adenocarcinoma   B. LUNG, RUL TARGET 1, BRUSHING:  - Malignant cells consistent with adenocarcinoma   C. LUNG, RUL TARGET 2, BRUSHING:  - Malignant cells consistent with adenocarcinoma   D. LUNG, RUL TARGET 2, FINE NEEDLE ASPIRATION:  - Malignant cells consistent with adenocarcinoma    E. LUNG, LLL TARGET 3, BRUSHING:  - Malignant cells consistent with non-small cell carcinoma   F. LUNG, LLL TARGET 3, FINE NEEDLE ASPRATION:  - Malignant cells consistent with non-small cell carcinoma    PET 02/18/2021 The 2.0 by 1.6 cm left lower lobe nodule has a maximum SUV of 6.3, high suspicion for malignancy. The medial nodular portion of the more  posterior of the right upper lobe mass is has a maximum SUV of 7.0, high suspicion for malignancy. The less nodular and more lateral component of this lesion has a maximum SUV of 2.4. The more anterior of the right apical masses, has maximum SUV of 2.8, still suspicious for potential malignancy given the morphology. No current hypermetabolic adenopathy or distant metastatic lesions identified. Other imaging findings of potential clinical significance: Aortic Atherosclerosis (ICD10-I70.0) and Emphysema (ICD10-J43.9). Coronary atherosclerosis. Infrarenal left-sided IVC, a venous variant. Sigmoid colon diverticulosis.   02/02/2021 Super D CT Chest Spiculated left lower lobe nodule is highly worrisome for primary bronchogenic carcinoma. Mass like areas of consolidation in the right upper lobe are also worrisome for adenocarcinoma. Consider PET in further evaluation, as clinically indicated. Aortic atherosclerosis (ICD10-I70.0). Coronary artery calcification. Emphysema (ICD10-J43.9).          Latest Ref Rng & Units 09/17/2021    8:58 AM 05/21/2021    8:01 AM  04/01/2021    8:12 AM  CBC  WBC 4.0 - 10.5 K/uL 5.0  6.8  5.8   Hemoglobin 12.0 - 15.0 g/dL 17.4  15.6  12.7   Hematocrit 36.0 - 46.0 % 53.3  47.9  39.7   Platelets 150 - 400 K/uL 152  183  215        Latest Ref Rng & Units 09/17/2021    8:58 AM 05/21/2021    8:01 AM 04/01/2021    8:12 AM  BMP  Glucose 70 - 99 mg/dL 109  110  111   BUN 8 - 23 mg/dL 13  15  15    Creatinine 0.44 - 1.00 mg/dL 0.69  0.74  0.78   Sodium 135 - 145 mmol/L 140  142  137   Potassium 3.5 - 5.1 mmol/L 4.1  4.5  4.1   Chloride 98 - 111 mmol/L 101  106  104   CO2 22 - 32 mmol/L 35  33  28   Calcium 8.9 - 10.3 mg/dL 8.6  8.9  8.5     BNP    Component Value Date/Time   BNP 268.8 (H) 12/19/2020 1848    ProBNP No results found for: "PROBNP"  PFT    Component Value Date/Time   FEV1PRE 1.09 01/26/2021 1552   FEV1POST 1.07 01/26/2021 1552    FVCPRE 1.96 01/26/2021 1552   FVCPOST 1.93 01/26/2021 1552   TLC 4.27 01/26/2021 1552   DLCOUNC 15.04 01/26/2021 1552   PREFEV1FVCRT 55 01/26/2021 1552   PSTFEV1FVCRT 55 01/26/2021 1552    No results found.   Past medical hx Past Medical History:  Diagnosis Date   COPD (chronic obstructive pulmonary disease) (HCC)    Dyspnea    GERD (gastroesophageal reflux disease)    History of radiation therapy    RUL lung and LLL lung 06/15/2021-06/24/2021  Dr Gery Pray   Pneumonia      Social History   Tobacco Use   Smoking status: Some Days    Packs/day: 1.00    Years: 60.00    Total pack years: 60.00    Types: Cigarettes    Passive exposure: Past   Smokeless tobacco: Never  Vaping Use   Vaping Use: Former   Substances: Nicotine, Flavoring  Substance Use Topics   Alcohol use: Yes    Comment: Social   Drug use: Never    Ms.Reddish reports that she has been smoking cigarettes. She has a 60.00 pack-year smoking history. She has been exposed to tobacco smoke. She has never used smokeless tobacco. She reports current alcohol use. She reports that she does not use drugs.  Tobacco Cessation: Some day smoker despite cancer diagnosis and recent treatment. Counseled to quit  Past surgical hx, Family hx, Social hx all reviewed.  Current Outpatient Medications on File Prior to Visit  Medication Sig   naproxen sodium (ALEVE) 220 MG tablet Take 220 mg by mouth as needed.   OXYGEN Inhale 3 L into the lungs as needed. When 02 sats drop below 92   No current facility-administered medications on file prior to visit.     Allergies  Allergen Reactions   Penicillins Rash    Review Of Systems:  Constitutional:   No  weight loss, night sweats,  Fevers, chills, fatigue, or  lassitude.  HEENT:   No headaches,  Difficulty swallowing,  Tooth/dental problems, or  Sore throat,                No sneezing, itching,  ear ache, nasal congestion, post nasal drip,   CV:  No chest pain,   Orthopnea, PND, swelling in lower extremities, anasarca, dizziness, palpitations, syncope.   GI  No heartburn, indigestion, abdominal pain, nausea, vomiting, diarrhea, change in bowel habits, loss of appetite, bloody stools.   Resp: + shortness of breath with exertion less at rest.  No excess mucus, no productive cough,  No non-productive cough,  No coughing up of blood.  No change in color of mucus.  No wheezing.  No chest wall deformity  Skin: no rash or lesions.  GU: no dysuria, change in color of urine, no urgency or frequency.  No flank pain, no hematuria   MS:  No joint pain or swelling.  No decreased range of motion.  No back pain.  Psych:  No change in mood or affect. No depression or anxiety.  No memory loss.   Vital Signs BP 120/80 (BP Location: Left Arm, Cuff Size: Normal)   Pulse 81   Temp 98.1 F (36.7 C) (Oral)   Ht 5\' 5"  (1.651 m)   Wt 131 lb 12.8 oz (59.8 kg)   LMP  (LMP Unknown)   SpO2 90%   BMI 21.93 kg/m    Physical Exam:  General- No distress,  A&Ox3, pleasant, pale ENT: No sinus tenderness, TM clear, pale nasal mucosa, no oral exudate,no post nasal drip, no LAN Cardiac: S1, S2, regular rate and rhythm, no murmur Chest: No wheeze/ rales/ dullness; no accessory muscle use, no nasal flaring, no sternal retractions, diminished per bases Abd.: Soft Non-tender Ext: No clubbing cyanosis, edema Neuro:  normal strength Skin: No rashes, warm and dry Psych: normal mood and behavior   Assessment/Plan  COPD and Dyspnea  Worse since latest radiation ended 06/2021 Suspect may be equipment related ( Concentrator at home)  Plan It is good to see you today. We will do a CXR today to see why you are having shortness of breath and increased oxygen requirements. I will call you with results Please wear oxygen when you are at work and at home to maintain your saturations > 92% at all times . I think the issue may be your concentrator.  We will place an order for your  DME to evaluate your concentrator.  We will see if we can change your DME as you are having so much trouble getting your oxygen.  ( You have been with current company since 12/21/2020, so under 12 months). We will do a therapeutic trial on Breztri.  Use 2 puffs in the morning and 2 puffs in the evening Rinse mouth after use.  Look on [facility] for an albuterol inhaler to try. Call and let us know if you want a prescription sent, and to where.  Call me and let me know if you like the Brextri, and we can work on seeing if you can get financial assistance.  Work on quitting smoking completely.  Follow up  in 3 months with Judson Roch NP Please contact office for sooner follow up if symptoms do not improve or worsen or seek emergency care     I spent 40  minutes dedicated to the care of this patient on the date of this encounter to include pre-visit review of records, face-to-face time with the patient discussing conditions above, post visit ordering of testing, clinical documentation with the electronic health record, making appropriate referrals as documented, and communicating necessary information to the patient's healthcare team.   Magdalen Spatz, NP 11/24/2021  2:02  PM

## 2021-11-24 ENCOUNTER — Ambulatory Visit (INDEPENDENT_AMBULATORY_CARE_PROVIDER_SITE_OTHER): Payer: Medicare Other

## 2021-11-24 ENCOUNTER — Ambulatory Visit (INDEPENDENT_AMBULATORY_CARE_PROVIDER_SITE_OTHER): Payer: Medicare Other | Admitting: Acute Care

## 2021-11-24 ENCOUNTER — Encounter: Payer: Self-pay | Admitting: Acute Care

## 2021-11-24 VITALS — BP 120/80 | HR 81 | Temp 98.1°F | Ht 65.0 in | Wt 131.8 lb

## 2021-11-24 DIAGNOSIS — R0609 Other forms of dyspnea: Secondary | ICD-10-CM

## 2021-11-24 DIAGNOSIS — J440 Chronic obstructive pulmonary disease with acute lower respiratory infection: Secondary | ICD-10-CM

## 2021-11-24 DIAGNOSIS — C3490 Malignant neoplasm of unspecified part of unspecified bronchus or lung: Secondary | ICD-10-CM | POA: Diagnosis not present

## 2021-11-24 DIAGNOSIS — F17210 Nicotine dependence, cigarettes, uncomplicated: Secondary | ICD-10-CM

## 2021-11-24 DIAGNOSIS — J209 Acute bronchitis, unspecified: Secondary | ICD-10-CM | POA: Diagnosis not present

## 2021-11-24 MED ORDER — BREZTRI AEROSPHERE 160-9-4.8 MCG/ACT IN AERO: 2.0000 | INHALATION_SPRAY | Freq: Two times a day (BID) | RESPIRATORY_TRACT | 0 refills | Status: DC

## 2021-11-24 NOTE — Patient Instructions (Addendum)
It is good to see you today. We will do a CXR today to see why you are having shortness of breath and increased oxygen requirements. I will call you with results Please wear oxygen when you are at work and at home to maintain your saturations > 92% at all times . I think the issue may be your concentrator.  We will place an order for your DME to evaluate your concentrator.  We will see if we can change your DME as you are having so much trouble getting your oxygen.  ( You have been with current company since 12/21/2020, so under 12 months). We will do a therapeutic trial on Breztri.  Use 2 puffs in the morning and 2 puffs in the evening Rinse mouth after use.  Look on [facility] for an albuterol inhaler to try. Call and let us know if you want a prescription sent, and to where.  Call me and let me know if you like the Brextri, and we can work on seeing if you can get financial assistance.  Follow up  in 3 months with Judson Roch NP Please contact office for sooner follow up if symptoms do not improve or worsen or seek emergency care

## 2022-03-12 ENCOUNTER — Telehealth: Payer: Self-pay | Admitting: Physician Assistant

## 2022-03-12 NOTE — Telephone Encounter (Signed)
Called patient regarding rescheduled appointment 11/22 appointment to 11/27, patient has been called and notified.

## 2022-03-15 ENCOUNTER — Encounter (HOSPITAL_BASED_OUTPATIENT_CLINIC_OR_DEPARTMENT_OTHER): Payer: Self-pay | Admitting: Emergency Medicine

## 2022-03-15 ENCOUNTER — Emergency Department (HOSPITAL_BASED_OUTPATIENT_CLINIC_OR_DEPARTMENT_OTHER): Payer: Medicare Other

## 2022-03-15 ENCOUNTER — Emergency Department (HOSPITAL_BASED_OUTPATIENT_CLINIC_OR_DEPARTMENT_OTHER)
Admission: EM | Admit: 2022-03-15 | Discharge: 2022-03-15 | Disposition: A | Discharge status: Home / Self Care | Payer: Medicare Other | Attending: Emergency Medicine | Admitting: Emergency Medicine

## 2022-03-15 ENCOUNTER — Other Ambulatory Visit: Payer: Self-pay

## 2022-03-15 DIAGNOSIS — W01198A Fall on same level from slipping, tripping and stumbling with subsequent striking against other object, initial encounter: Secondary | ICD-10-CM | POA: Diagnosis not present

## 2022-03-15 DIAGNOSIS — S12100A Unspecified displaced fracture of second cervical vertebra, initial encounter for closed fracture: Secondary | ICD-10-CM

## 2022-03-15 DIAGNOSIS — S0083XA Contusion of other part of head, initial encounter: Secondary | ICD-10-CM | POA: Diagnosis not present

## 2022-03-15 DIAGNOSIS — J449 Chronic obstructive pulmonary disease, unspecified: Secondary | ICD-10-CM | POA: Diagnosis not present

## 2022-03-15 DIAGNOSIS — S12112A Nondisplaced Type II dens fracture, initial encounter for closed fracture: Secondary | ICD-10-CM | POA: Insufficient documentation

## 2022-03-15 NOTE — Discharge Instructions (Signed)
The CT scan shows that you broke your neck in 2 different places.  Please wear the c-collar at all times.  Follow-up with the neurosurgeon in 2 weeks, call tomorrow to help set up this appointment.  If at any point your pain is out of control, you develop vision changes, severe headache, weakness or numbness in the arms or legs, incontinence of your bowel or bladder, or any other new/concerning symptoms and return to the ER or call 911.

## 2022-03-15 NOTE — ED Notes (Signed)
Pt aware of the need to leave her Collar in place.

## 2022-03-15 NOTE — ED Provider Notes (Signed)
[facility] Provider Note   CSN: [id number] Arrival date & time: 03/15/22  1350     History  Chief Complaint  Patient presents with   Fall   Head Injury    Amy Sandoval is a 87 y.o. female.  HPI 87 year old female who is chronically on oxygen presents after a fall.  She was bending over in the middle of the night when she was going to the bathroom and bent over too far and could not stop her cell from falling.  Never felt lightheadedness or dizziness and never lost consciousness.  Hit her head and then her head went into the wall.  Primarily she is having a lot of neck stiffness.  She has a bruise to her forehead but states her head feels fine.  No numbness or weakness in her extremities.  Home Medications Prior to Admission medications   Medication Sig Start Date End Date Taking? Authorizing Provider  Budeson-Glycopyrrol-Formoterol (BREZTRI AEROSPHERE) 160-9-4.8 MCG/ACT AERO Inhale 2 puffs into the lungs in the morning and at bedtime. 11/24/21   Magdalen Spatz, NP  naproxen sodium (ALEVE) 220 MG tablet Take 220 mg by mouth as needed.    [provider]  OXYGEN Inhale 3 L into the lungs as needed. When 02 sats drop below 92    [provider]      Allergies    Penicillins    Review of Systems   Review of Systems  Musculoskeletal:  Positive for neck pain and neck stiffness.  Neurological:  Negative for weakness, numbness and headaches.    Physical Exam Updated Vital Signs BP (!) 160/79 (BP Location: Right Arm)   Pulse 88   Temp 98.2 F (36.8 C)   Resp (!) 22   Ht 5\' 4"  (1.626 m)   Wt 63 kg   LMP  (LMP Unknown)   SpO2 95%   BMI 23.86 kg/m  Physical Exam Vitals and nursing note reviewed.  Constitutional:      General: She is not in acute distress.    Appearance: She is well-developed. She is not ill-appearing or diaphoretic.  HENT:     Head: Normocephalic. Contusion present.   Cardiovascular:     Rate and  Rhythm: Normal rate and regular rhythm.     Heart sounds: Normal heart sounds.  Pulmonary:     Effort: Pulmonary effort is normal.  Abdominal:     General: There is no distension.  Musculoskeletal:     Cervical back: Muscular tenderness present. Decreased range of motion.  Skin:    General: Skin is warm and dry.  Neurological:     Mental Status: She is alert.     Comments: CN 3-12 grossly intact. 5/5 strength in all 4 extremities. Grossly normal sensation. Normal finger to nose.      ED Results / Procedures / Treatments   Labs (all labs ordered are listed, but only abnormal results are displayed) Labs Reviewed - No data to display  EKG None  Radiology CT Head Wo Contrast  Addendum Date: 03/15/2022   ADDENDUM REPORT: 03/15/2022 18:00 ADDENDUM: The reports were inadvertently linked in the voice recognition system. The CT of the cervical spine and maxillofacial will be dictated here as an addendum. The findings were discussed with Dr. Regenia Skeeter Cervical spine: Slight degenerative anterolisthesis present at C4-5 and C5-6. Slight degenerative anterolisthesis is also present at C7-T1. C1 ring is fractured anteriorly with 2 mm displacement. Second fracture is present in the right lamina. A type  1 dens fracture is noted anteriorly. The fracture may be incomplete. No additional fractures are present. No significant prevertebral fluid or soft tissue swelling is present. No hemorrhage is present in the canal. Atherosclerotic calcifications are present at the carotid bifurcations bilaterally. Stenosis is suspected on the left. Uncovertebral and facet hypertrophy result in multilevel foraminal stenosis, greatest at C6-7. A nodule of the right upper lobe is slightly decreased in size from the prior study, measuring 2.3 x 1.3 cm compared with 2.6 x 1.5 cm previously. No new nodule or mass lesion is present. Additional scarring is present bilaterally. Impressions: 1. Anterior and right lateral C1  fractures. 2. Incomplete type 1 dens fracture. 3. Multilevel degenerative change in the cervical spine. Maxillofacial CT No acute facial fractures are present. Mandible is intact and located. The C1 and type 1 dense fractures are again seen. Supraorbital soft tissue swelling is present. No underlying fracture is present. Bilateral lens replacements are noted. Globes and orbits are otherwise unremarkable. The paranasal sinuses and mastoid air cells are clear. Impressions: No acute fractures of the face. C1 and C2 fractures as above. Electronically Signed   By: San Morelle M.D.   On: 03/15/2022 18:00   Result Date: 03/15/2022 CLINICAL DATA:  Head trauma, minor.  Fall.  Hit head on floor. EXAM: CT HEAD WITHOUT CONTRAST TECHNIQUE: Contiguous axial images were obtained from the base of the skull through the vertex without intravenous contrast. RADIATION DOSE REDUCTION: This exam was performed according to the departmental dose-optimization program which includes automated exposure control, adjustment of the mA and/or kV according to patient size and/or use of iterative reconstruction technique. COMPARISON:  MRI head without contrast 03/17/2021 FINDINGS: Brain: Remote left ACA territory infarct again noted. No acute infarct, hemorrhage, or mass lesion is present. No significant white matter lesions are present. Deep brain nuclei are within normal limits. The ventricles are of normal size. No significant extraaxial fluid collection is present. The brainstem and cerebellum are within normal limits. Vascular: Atherosclerotic calcifications are present within the cavernous internal carotid arteries bilaterally. No hyperdense vessel is present. Skull: A frontal scalp hematoma extends from just above the orbits to near the vertex. A left paramedian laceration is present. No underlying fracture or foreign body is present. Sinuses/Orbits: The paranasal sinuses and mastoid air cells are clear. Bilateral lens  replacements are noted. Globes and orbits are otherwise unremarkable. IMPRESSION: 1. Frontal scalp hematoma and left paramedian laceration without underlying fracture or foreign body. 2. No acute intracranial abnormality or significant interval change. 3. Remote left ACA territory infarct. Electronically Signed: By: San Morelle M.D. On: 03/15/2022 15:29   CT Cervical Spine Wo Contrast  Addendum Date: 03/15/2022   ADDENDUM REPORT: 03/15/2022 18:00 ADDENDUM: The reports were inadvertently linked in the voice recognition system. The CT of the cervical spine and maxillofacial will be dictated here as an addendum. The findings were discussed with Dr. Regenia Skeeter Cervical spine: Slight degenerative anterolisthesis present at C4-5 and C5-6. Slight degenerative anterolisthesis is also present at C7-T1. C1 ring is fractured anteriorly with 2 mm displacement. Second fracture is present in the right lamina. A type 1 dens fracture is noted anteriorly. The fracture may be incomplete. No additional fractures are present. No significant prevertebral fluid or soft tissue swelling is present. No hemorrhage is present in the canal. Atherosclerotic calcifications are present at the carotid bifurcations bilaterally. Stenosis is suspected on the left. Uncovertebral and facet hypertrophy result in multilevel foraminal stenosis, greatest at C6-7. A nodule of the right  upper lobe is slightly decreased in size from the prior study, measuring 2.3 x 1.3 cm compared with 2.6 x 1.5 cm previously. No new nodule or mass lesion is present. Additional scarring is present bilaterally. Impressions: 1. Anterior and right lateral C1 fractures. 2. Incomplete type 1 dens fracture. 3. Multilevel degenerative change in the cervical spine. Maxillofacial CT No acute facial fractures are present. Mandible is intact and located. The C1 and type 1 dense fractures are again seen. Supraorbital soft tissue swelling is present. No underlying fracture is  present. Bilateral lens replacements are noted. Globes and orbits are otherwise unremarkable. The paranasal sinuses and mastoid air cells are clear. Impressions: No acute fractures of the face. C1 and C2 fractures as above. Electronically Signed   By: San Morelle M.D.   On: 03/15/2022 18:00   Result Date: 03/15/2022 CLINICAL DATA:  Head trauma, minor.  Fall.  Hit head on floor. EXAM: CT HEAD WITHOUT CONTRAST TECHNIQUE: Contiguous axial images were obtained from the base of the skull through the vertex without intravenous contrast. RADIATION DOSE REDUCTION: This exam was performed according to the departmental dose-optimization program which includes automated exposure control, adjustment of the mA and/or kV according to patient size and/or use of iterative reconstruction technique. COMPARISON:  MRI head without contrast 03/17/2021 FINDINGS: Brain: Remote left ACA territory infarct again noted. No acute infarct, hemorrhage, or mass lesion is present. No significant white matter lesions are present. Deep brain nuclei are within normal limits. The ventricles are of normal size. No significant extraaxial fluid collection is present. The brainstem and cerebellum are within normal limits. Vascular: Atherosclerotic calcifications are present within the cavernous internal carotid arteries bilaterally. No hyperdense vessel is present. Skull: A frontal scalp hematoma extends from just above the orbits to near the vertex. A left paramedian laceration is present. No underlying fracture or foreign body is present. Sinuses/Orbits: The paranasal sinuses and mastoid air cells are clear. Bilateral lens replacements are noted. Globes and orbits are otherwise unremarkable. IMPRESSION: 1. Frontal scalp hematoma and left paramedian laceration without underlying fracture or foreign body. 2. No acute intracranial abnormality or significant interval change. 3. Remote left ACA territory infarct. Electronically Signed: By:  San Morelle M.D. On: 03/15/2022 15:29   CT Maxillofacial Wo Contrast  Addendum Date: 03/15/2022   ADDENDUM REPORT: 03/15/2022 18:00 ADDENDUM: The reports were inadvertently linked in the voice recognition system. The CT of the cervical spine and maxillofacial will be dictated here as an addendum. The findings were discussed with Dr. Regenia Skeeter Cervical spine: Slight degenerative anterolisthesis present at C4-5 and C5-6. Slight degenerative anterolisthesis is also present at C7-T1. C1 ring is fractured anteriorly with 2 mm displacement. Second fracture is present in the right lamina. A type 1 dens fracture is noted anteriorly. The fracture may be incomplete. No additional fractures are present. No significant prevertebral fluid or soft tissue swelling is present. No hemorrhage is present in the canal. Atherosclerotic calcifications are present at the carotid bifurcations bilaterally. Stenosis is suspected on the left. Uncovertebral and facet hypertrophy result in multilevel foraminal stenosis, greatest at C6-7. A nodule of the right upper lobe is slightly decreased in size from the prior study, measuring 2.3 x 1.3 cm compared with 2.6 x 1.5 cm previously. No new nodule or mass lesion is present. Additional scarring is present bilaterally. Impressions: 1. Anterior and right lateral C1 fractures. 2. Incomplete type 1 dens fracture. 3. Multilevel degenerative change in the cervical spine. Maxillofacial CT No acute facial fractures are present.  Mandible is intact and located. The C1 and type 1 dense fractures are again seen. Supraorbital soft tissue swelling is present. No underlying fracture is present. Bilateral lens replacements are noted. Globes and orbits are otherwise unremarkable. The paranasal sinuses and mastoid air cells are clear. Impressions: No acute fractures of the face. C1 and C2 fractures as above. Electronically Signed   By: San Morelle M.D.   On: 03/15/2022 18:00   Result Date:  03/15/2022 CLINICAL DATA:  Head trauma, minor.  Fall.  Hit head on floor. EXAM: CT HEAD WITHOUT CONTRAST TECHNIQUE: Contiguous axial images were obtained from the base of the skull through the vertex without intravenous contrast. RADIATION DOSE REDUCTION: This exam was performed according to the departmental dose-optimization program which includes automated exposure control, adjustment of the mA and/or kV according to patient size and/or use of iterative reconstruction technique. COMPARISON:  MRI head without contrast 03/17/2021 FINDINGS: Brain: Remote left ACA territory infarct again noted. No acute infarct, hemorrhage, or mass lesion is present. No significant white matter lesions are present. Deep brain nuclei are within normal limits. The ventricles are of normal size. No significant extraaxial fluid collection is present. The brainstem and cerebellum are within normal limits. Vascular: Atherosclerotic calcifications are present within the cavernous internal carotid arteries bilaterally. No hyperdense vessel is present. Skull: A frontal scalp hematoma extends from just above the orbits to near the vertex. A left paramedian laceration is present. No underlying fracture or foreign body is present. Sinuses/Orbits: The paranasal sinuses and mastoid air cells are clear. Bilateral lens replacements are noted. Globes and orbits are otherwise unremarkable. IMPRESSION: 1. Frontal scalp hematoma and left paramedian laceration without underlying fracture or foreign body. 2. No acute intracranial abnormality or significant interval change. 3. Remote left ACA territory infarct. Electronically Signed: By: San Morelle M.D. On: 03/15/2022 15:29    Procedures Procedures    Medications Ordered in ED Medications - No data to display  ED Course/ Medical Decision Making/ A&P                           Medical Decision Making Amount and/or Complexity of Data Reviewed Radiology: ordered and independent  interpretation performed.    Details: Head CT without head bleed.  No obvious facial fracture.  However she does have a C1 fracture.  Discussed with radiology.   Patient presents after a fall that does not sound like syncope.  Does not sound like she had a stroke.  However unfortunately she developed spinal fractures as above.  Neuro exam is unremarkable.  She is chronically on oxygen but no new hypoxia.  Discussed case with neurosurgery on-call, Margo Aye, working with Dr. Ronnald Ramp.  Recommends cervical collar and follow-up in 2 weeks.  Patient declines anything for pain.  At this point, hard cervical collar has been placed and she appears stable for discharge with return precautions.  We discussed the importance of wearing the cervical collar.        Final Clinical Impression(s) / ED Diagnoses Final diagnoses:  Closed odontoid fracture, initial encounter [facility])    Rx / DC Orders ED Discharge Orders     None         Sherwood Gambler, MD 03/15/22 1816

## 2022-03-15 NOTE — ED Triage Notes (Signed)
Pt arrives to ED with c/o fall and head injury. Pt reports she fell today and hit her head on the floor. She notes neck pain. She was sitting on the commode and lean forward. Hx COPD on home O2.

## 2022-03-15 NOTE — ED Notes (Signed)
Neck brace placed and secured. Approved by Provider. Discharge paperwork given and verbally understood.

## 2022-03-22 ENCOUNTER — Ambulatory Visit (HOSPITAL_COMMUNITY)
Admission: RE | Admit: 2022-03-22 | Discharge: 2022-03-22 | Disposition: A | Discharge status: Home / Self Care | Payer: Medicare Other | Source: Ambulatory Visit | Attending: Internal Medicine | Admitting: Internal Medicine

## 2022-03-22 ENCOUNTER — Encounter (HOSPITAL_COMMUNITY): Payer: Self-pay

## 2022-03-22 ENCOUNTER — Other Ambulatory Visit: Payer: Self-pay

## 2022-03-22 ENCOUNTER — Inpatient Hospital Stay: Payer: Medicare Other | Attending: Physician Assistant

## 2022-03-22 DIAGNOSIS — C3490 Malignant neoplasm of unspecified part of unspecified bronchus or lung: Secondary | ICD-10-CM

## 2022-03-22 DIAGNOSIS — I2510 Atherosclerotic heart disease of native coronary artery without angina pectoris: Secondary | ICD-10-CM | POA: Diagnosis not present

## 2022-03-22 DIAGNOSIS — C3411 Malignant neoplasm of upper lobe, right bronchus or lung: Secondary | ICD-10-CM | POA: Insufficient documentation

## 2022-03-22 DIAGNOSIS — Z87891 Personal history of nicotine dependence: Secondary | ICD-10-CM | POA: Diagnosis not present

## 2022-03-22 DIAGNOSIS — I700 Atherosclerosis of aorta: Secondary | ICD-10-CM | POA: Insufficient documentation

## 2022-03-22 DIAGNOSIS — R918 Other nonspecific abnormal finding of lung field: Secondary | ICD-10-CM | POA: Diagnosis not present

## 2022-03-22 LAB — CBC WITH DIFFERENTIAL (CANCER CENTER ONLY)
Abs Immature Granulocytes: 0.01 10*3/uL (ref 0.00–0.07)
Basophils Absolute: 0 10*3/uL (ref 0.0–0.1)
Basophils Relative: 0 %
Eosinophils Absolute: 0.2 10*3/uL (ref 0.0–0.5)
Eosinophils Relative: 3 %
HCT: 45.2 % (ref 36.0–46.0)
Hemoglobin: 14.3 g/dL (ref 12.0–15.0)
Immature Granulocytes: 0 %
Lymphocytes Relative: 22 %
Lymphs Abs: 1.3 10*3/uL (ref 0.7–4.0)
MCH: 35 pg — ABNORMAL HIGH (ref 26.0–34.0)
MCHC: 31.6 g/dL (ref 30.0–36.0)
MCV: 110.5 fL — ABNORMAL HIGH (ref 80.0–100.0)
Monocytes Absolute: 0.6 10*3/uL (ref 0.1–1.0)
Monocytes Relative: 10 %
Neutro Abs: 3.8 10*3/uL (ref 1.7–7.7)
Neutrophils Relative %: 65 %
Platelet Count: 160 10*3/uL (ref 150–400)
RBC: 4.09 MIL/uL (ref 3.87–5.11)
RDW: 13.3 % (ref 11.5–15.5)
WBC Count: 5.9 10*3/uL (ref 4.0–10.5)
nRBC: 0 % (ref 0.0–0.2)

## 2022-03-22 LAB — CMP (CANCER CENTER ONLY)
ALT: 46 U/L — ABNORMAL HIGH (ref 0–44)
AST: 55 U/L — ABNORMAL HIGH (ref 15–41)
Albumin: 4 g/dL (ref 3.5–5.0)
Alkaline Phosphatase: 82 U/L (ref 38–126)
Anion gap: 3 — ABNORMAL LOW (ref 5–15)
BUN: 13 mg/dL (ref 8–23)
CO2: 40 mmol/L — ABNORMAL HIGH (ref 22–32)
Calcium: 9.3 mg/dL (ref 8.9–10.3)
Chloride: 98 mmol/L (ref 98–111)
Creatinine: 0.49 mg/dL (ref 0.44–1.00)
GFR, Estimated: >60 mL/min (ref >60)
Glucose, Bld: 98 mg/dL (ref 70–99)
Potassium: 5.3 mmol/L — ABNORMAL HIGH (ref 3.5–5.1)
Sodium: 141 mmol/L (ref 135–145)
Total Bilirubin: 0.6 mg/dL (ref 0.3–1.2)
Total Protein: 6.7 g/dL (ref 6.5–8.1)

## 2022-03-22 MED ORDER — SODIUM CHLORIDE (PF) 0.9 % IJ SOLN
INTRAMUSCULAR | Status: AC
Start: 2022-03-22 — End: 2022-03-22
  Filled 2022-03-22: qty 50

## 2022-03-22 MED ORDER — IOHEXOL 300 MG/ML  SOLN
75.0000 mL | Freq: Once | INTRAMUSCULAR | Status: AC | PRN
Start: 2022-03-22 — End: 2022-03-22
  Administered 2022-03-22: 75 mL via INTRAVENOUS

## 2022-03-24 ENCOUNTER — Ambulatory Visit: Payer: Medicare Other | Admitting: Internal Medicine

## 2022-03-29 ENCOUNTER — Inpatient Hospital Stay: Payer: Medicare Other | Admitting: Internal Medicine

## 2022-03-29 ENCOUNTER — Telehealth: Payer: Self-pay | Admitting: Physician Assistant

## 2022-03-29 ENCOUNTER — Inpatient Hospital Stay: Payer: Medicare Other | Admitting: Physician Assistant

## 2022-03-29 NOTE — Telephone Encounter (Signed)
Called patient regarding rescheduled December appointment, left a voicemail.

## 2022-04-07 NOTE — Progress Notes (Unsigned)
[facility] OFFICE PROGRESS NOTE  Pcp, No No address on file  DIAGNOSIS: Stage IV (T2b, N0, M1 a) non-small cell lung cancer with unclear subtype on the pathology report diagnosed in November 2022 and presented with right upper lobe lung mass as well as right upper lobe nodule in addition to right lower lobe nodule.   DETECTED ALTERATION(S) / BIOMARKER(S)     % CFDNA OR AMPLIFICATION        ASSOCIATED FDA-APPROVED THERAPIES         CLINICAL TRIAL AVAILABILITY [id number] 0.1% None        No  PRIOR THERAPY: SBRT to the left and right lung nodules under the care of Dr. Sondra Come. This was completed on 06/24/21.   CURRENT THERAPY: Observation  INTERVAL HISTORY: Amy Sandoval 87 y.o. female returns to the clinic today for a follow-up visit accompanied by her son-in-law.  The patient was last seen 6 months ago by Dr. Julien Nordmann.  She presented with a right upper lobe lung mass and adjacent nodule in addition to a synchronous primary versus locally of metastatic nodule in the left lower lobe which was diagnosed in November 2022.  She underwent SBRT to these lesions and has been on observation since and feeling fine  Earlier this month, the patient had an accident at home.  She was getting up in the [place] to use the bathroom and she bent too far over and had a fall.  She is currently wearing a neck brace for a fracture and she is followed by orthopedics.  Denies any dizziness, lightheadedness, headaches, lower extremity weakness.  Occurred around November 12 or 13.  She denies any fever, chills, night sweats, or unexplained weight loss.  Denies any nausea, vomiting, diarrhea, or constipation.  He has baseline dyspnea on exertion for which she is on 3 L of supplemental oxygen at baseline.  She came into the clinic today with a small oxygen tank and her oxygen was on 84%.  When switched to 3 L with a oxygen supply take that she has at home, her oxygen improved to 100%.  Denies any cough,  hemoptysis, or chest pain.  Her scan showed incidental rib fracture.  The patient denies any rib pain.  She recently had a restaging CT scan performed.  She is here today to review her scans and for more detailed discussion about her current condition.   MEDICAL HISTORY: Past Medical History:  Diagnosis Date   COPD (chronic obstructive pulmonary disease) (HCC)    Dyspnea    GERD (gastroesophageal reflux disease)    History of radiation therapy    RUL lung and LLL lung 06/15/2021-06/24/2021  Dr Gery Pray   Non-small cell lung cancer (NSCLC) ([place]) 12/2020   Pneumonia     ALLERGIES:  is allergic to penicillins.  MEDICATIONS:  Current Outpatient Medications  Medication Sig Dispense Refill   Budeson-Glycopyrrol-Formoterol (BREZTRI AEROSPHERE) 160-9-4.8 MCG/ACT AERO Inhale 2 puffs into the lungs in the morning and at bedtime. 10.7 g 0   naproxen sodium (ALEVE) 220 MG tablet Take 220 mg by mouth as needed.     OXYGEN Inhale 3 L into the lungs as needed. When 02 sats drop below 92     No current facility-administered medications for this visit.    SURGICAL HISTORY:  Past Surgical History:  Procedure Laterality Date   BRONCHIAL BIOPSY  03/09/2021   Procedure: BRONCHIAL BIOPSIES;  Surgeon: Collene Gobble, MD;  Location: [facility] ENDOSCOPY;  Service: Pulmonary;;   BRONCHIAL  BRUSHINGS  03/09/2021   Procedure: BRONCHIAL BRUSHINGS;  Surgeon: Collene Gobble, MD;  Location: [facility] ENDOSCOPY;  Service: Pulmonary;;   BRONCHIAL NEEDLE ASPIRATION BIOPSY  03/09/2021   Procedure: BRONCHIAL NEEDLE ASPIRATION BIOPSIES;  Surgeon: Collene Gobble, MD;  Location: [facility] ENDOSCOPY;  Service: Pulmonary;;   DILATION AND CURETTAGE OF UTERUS     EYE SURGERY     bilateral cataract removal   TONSILLECTOMY     VIDEO BRONCHOSCOPY WITH ENDOBRONCHIAL NAVIGATION Bilateral 03/09/2021   Procedure: VIDEO BRONCHOSCOPY WITH ENDOBRONCHIAL NAVIGATION;  Surgeon: Collene Gobble, MD;  Location: [place] ENDOSCOPY;  Service: Pulmonary;   Laterality: Bilateral;  ION w/ possible fiducial    REVIEW OF SYSTEMS:   Review of Systems  Constitutional: Negative for appetite change, chills, fatigue, fever and unexpected weight change.  HENT: Negative for mouth sores, nosebleeds, sore throat and trouble swallowing.   Eyes: Negative for eye problems and icterus.  Respiratory: Positive for baseline dyspnea on exertion.  Negative for cough, hemoptysis, and wheezing.   Cardiovascular: Negative for chest pain and leg swelling.  Gastrointestinal: Negative for abdominal pain, constipation, diarrhea, nausea and vomiting.  Genitourinary: Negative for bladder incontinence, difficulty urinating, dysuria, frequency and hematuria.   Musculoskeletal: Negative for back pain, gait problem, neck pain and neck stiffness.  Skin: Negative for itching and rash.  Neurological: Negative for dizziness, extremity weakness, gait problem, headaches, light-headedness and seizures.  Hematological: Negative for adenopathy. Does not bruise/bleed easily.  Psychiatric/Behavioral: Negative for confusion, depression and sleep disturbance. The patient is not nervous/anxious.     PHYSICAL EXAMINATION:  Blood pressure 127/63, pulse 81, temperature 97.6 F (36.4 C), temperature source Oral, resp. rate 18, weight 136 lb 11.2 oz (62 kg), SpO2 100 %.  ECOG PERFORMANCE STATUS: 2  Physical Exam  Constitutional: Oriented to person, place, and time and well-developed, well-nourished, and in no distress. HENT:  Head: Normocephalic and atraumatic.  Limited range of motion of the neck secondary to c-collar. Mouth/Throat: Oropharynx is clear and moist. No oropharyngeal exudate.  Eyes: Conjunctivae are normal. Right eye exhibits no discharge. Left eye exhibits no discharge. No scleral icterus.  Neck: Normal range of motion. Neck supple.  Cardiovascular: Normal rate, regular rhythm, normal heart sounds and intact distal pulses.   Pulmonary/Chest: Effort normal and breath sounds  normal. No respiratory distress. No wheezes. No rales.  On 3 L of supplemental oxygen. Abdominal: Soft. Bowel sounds are normal. Exhibits no distension and no mass. There is no tenderness.  Musculoskeletal: Normal range of motionexcept limited range of motion of the neck.  Exhibits no edema.  Lymphadenopathy:    No cervical adenopathy.  Neurological: Alert and oriented to person, place, and time. Exhibits wasting.  Examined in the wheelchair.  Skin: Skin is warm and dry. No rash noted. Not diaphoretic. No erythema. No pallor.  Psychiatric: Mood, memory and judgment normal.  Vitals reviewed.  LABORATORY DATA: Lab Results  Component Value Date   WBC 5.9 03/22/2022   HGB 14.3 03/22/2022   HCT 45.2 03/22/2022   MCV 110.5 (H) 03/22/2022   PLT 160 03/22/2022      Chemistry      Component Value Date/Time   NA 141 03/22/2022 0849   K 5.3 (H) 03/22/2022 0849   CL 98 03/22/2022 0849   CO2 40 (H) 03/22/2022 0849   BUN 13 03/22/2022 0849   CREATININE 0.49 03/22/2022 0849      Component Value Date/Time   CALCIUM 9.3 03/22/2022 0849   ALKPHOS 82 03/22/2022 0849  AST 55 (H) 03/22/2022 0849   ALT 46 (H) 03/22/2022 0849   BILITOT 0.6 03/22/2022 0849       RADIOGRAPHIC STUDIES:  CT Chest W Contrast  Result Date: 03/22/2022 CLINICAL DATA:  87 year old female presenting for evaluation of non-small cell lung cancer on follow-up. * Tracking Code: BO * EXAM: CT CHEST WITH CONTRAST TECHNIQUE: Multidetector CT imaging of the chest was performed during intravenous contrast administration. RADIATION DOSE REDUCTION: This exam was performed according to the departmental dose-optimization program which includes automated exposure control, adjustment of the mA and/or kV according to patient size and/or use of iterative reconstruction technique. CONTRAST:  105mL OMNIPAQUE IOHEXOL 300 MG/ML  SOLN COMPARISON:  Sep 18, 2021. FINDINGS: Cardiovascular: Aortic atherosclerosis both calcified and noncalcified  moderate and similar to previous imaging. Heart size is stable. Calcified coronary artery disease as before. Central pulmonary arteries are unremarkable. Mediastinum/Nodes: No thoracic inlet, axillary, mediastinal or hilar adenopathy. Esophagus grossly normal. Lungs/Pleura: RIGHT upper lobe pulmonary nodule (image 25/5) 1.9 x 1.5 cm previously 2.6 x 1.5 cm. Nodular bandlike changes extend from a second treated nodule in the RIGHT upper lobe that measures 14 x 11 mm as compared to 15 x 14 mm. Nodular bandlike changes extend to the pleural surface. These are not substantially changed. LEFT lower lobe pulmonary nodule has contracted and is denser than on previous imaging (image 84/5) 19 x 11 mm previously 19 x 13 mm. 6 mm pulmonary nodule in the RIGHT lower lobe (image 118/5) previously approximately 3 mm and new since February 02, 2021. Airways are patent. No pleural effusion or nodularity. Upper Abdomen: Imaged portions the liver, pancreas, spleen, adrenal glands and kidneys without acute process. No acute gastrointestinal findings to the extent evaluated. Musculoskeletal: No acute bone finding. No destructive bone process. LEFT posterolateral eighth rib fracture appears recent but is more likely subacute and without displacement. This is involving the LEFT posterolateral eighth rib. No destructive bone lesions. IMPRESSION: 1. Slight interval decrease in size of the RIGHT upper lobe pulmonary nodule. 2. LEFT lower lobe pulmonary nodule has contracted and is denser than on previous imaging. Measuring slightly smaller than on the study from May. Close attention on follow-up given increased density though this remains considerably smaller than on pre treatment imaging and likely reflects contraction following treatment. 3. 6 mm pulmonary nodule in the RIGHT lower lobe previously approximately 3 mm and new since February 02, 2021. Consider short interval follow-up or PET imaging for further evaluation. 4. LEFT posterolateral  eighth rib fracture appears recent but is more likely subacute and without displacement. 5. Aortic atherosclerosis. 6. Calcified coronary artery disease. Aortic Atherosclerosis (ICD10-I70.0). Electronically Signed   By: Zetta Bills M.D.   On: 03/22/2022 15:48   CT Head Wo Contrast  Addendum Date: 03/15/2022   ADDENDUM REPORT: 03/15/2022 18:00 ADDENDUM: The reports were inadvertently linked in the voice recognition system. The CT of the cervical spine and maxillofacial will be dictated here as an addendum. The findings were discussed with Dr. Regenia Skeeter Cervical spine: Slight degenerative anterolisthesis present at C4-5 and C5-6. Slight degenerative anterolisthesis is also present at C7-T1. C1 ring is fractured anteriorly with 2 mm displacement. Second fracture is present in the right lamina. A type 1 dens fracture is noted anteriorly. The fracture may be incomplete. No additional fractures are present. No significant prevertebral fluid or soft tissue swelling is present. No hemorrhage is present in the canal. Atherosclerotic calcifications are present at the carotid bifurcations bilaterally. Stenosis is suspected on the left.  Uncovertebral and facet hypertrophy result in multilevel foraminal stenosis, greatest at C6-7. A nodule of the right upper lobe is slightly decreased in size from the prior study, measuring 2.3 x 1.3 cm compared with 2.6 x 1.5 cm previously. No new nodule or mass lesion is present. Additional scarring is present bilaterally. Impressions: 1. Anterior and right lateral C1 fractures. 2. Incomplete type 1 dens fracture. 3. Multilevel degenerative change in the cervical spine. Maxillofacial CT No acute facial fractures are present. Mandible is intact and located. The C1 and type 1 dense fractures are again seen. Supraorbital soft tissue swelling is present. No underlying fracture is present. Bilateral lens replacements are noted. Globes and orbits are otherwise unremarkable. The paranasal  sinuses and mastoid air cells are clear. Impressions: No acute fractures of the face. C1 and C2 fractures as above. Electronically Signed   By: San Morelle M.D.   On: 03/15/2022 18:00   Result Date: 03/15/2022 CLINICAL DATA:  Head trauma, minor.  Fall.  Hit head on floor. EXAM: CT HEAD WITHOUT CONTRAST TECHNIQUE: Contiguous axial images were obtained from the base of the skull through the vertex without intravenous contrast. RADIATION DOSE REDUCTION: This exam was performed according to the departmental dose-optimization program which includes automated exposure control, adjustment of the mA and/or kV according to patient size and/or use of iterative reconstruction technique. COMPARISON:  MRI head without contrast 03/17/2021 FINDINGS: Brain: Remote left ACA territory infarct again noted. No acute infarct, hemorrhage, or mass lesion is present. No significant white matter lesions are present. Deep brain nuclei are within normal limits. The ventricles are of normal size. No significant extraaxial fluid collection is present. The brainstem and cerebellum are within normal limits. Vascular: Atherosclerotic calcifications are present within the cavernous internal carotid arteries bilaterally. No hyperdense vessel is present. Skull: A frontal scalp hematoma extends from just above the orbits to near the vertex. A left paramedian laceration is present. No underlying fracture or foreign body is present. Sinuses/Orbits: The paranasal sinuses and mastoid air cells are clear. Bilateral lens replacements are noted. Globes and orbits are otherwise unremarkable. IMPRESSION: 1. Frontal scalp hematoma and left paramedian laceration without underlying fracture or foreign body. 2. No acute intracranial abnormality or significant interval change. 3. Remote left ACA territory infarct. Electronically Signed: By: San Morelle M.D. On: 03/15/2022 15:29   CT Cervical Spine Wo Contrast  Addendum Date: 03/15/2022    ADDENDUM REPORT: 03/15/2022 18:00 ADDENDUM: The reports were inadvertently linked in the voice recognition system. The CT of the cervical spine and maxillofacial will be dictated here as an addendum. The findings were discussed with Dr. Regenia Skeeter Cervical spine: Slight degenerative anterolisthesis present at C4-5 and C5-6. Slight degenerative anterolisthesis is also present at C7-T1. C1 ring is fractured anteriorly with 2 mm displacement. Second fracture is present in the right lamina. A type 1 dens fracture is noted anteriorly. The fracture may be incomplete. No additional fractures are present. No significant prevertebral fluid or soft tissue swelling is present. No hemorrhage is present in the canal. Atherosclerotic calcifications are present at the carotid bifurcations bilaterally. Stenosis is suspected on the left. Uncovertebral and facet hypertrophy result in multilevel foraminal stenosis, greatest at C6-7. A nodule of the right upper lobe is slightly decreased in size from the prior study, measuring 2.3 x 1.3 cm compared with 2.6 x 1.5 cm previously. No new nodule or mass lesion is present. Additional scarring is present bilaterally. Impressions: 1. Anterior and right lateral C1 fractures. 2. Incomplete type 1  dens fracture. 3. Multilevel degenerative change in the cervical spine. Maxillofacial CT No acute facial fractures are present. Mandible is intact and located. The C1 and type 1 dense fractures are again seen. Supraorbital soft tissue swelling is present. No underlying fracture is present. Bilateral lens replacements are noted. Globes and orbits are otherwise unremarkable. The paranasal sinuses and mastoid air cells are clear. Impressions: No acute fractures of the face. C1 and C2 fractures as above. Electronically Signed   By: San Morelle M.D.   On: 03/15/2022 18:00   Result Date: 03/15/2022 CLINICAL DATA:  Head trauma, minor.  Fall.  Hit head on floor. EXAM: CT HEAD WITHOUT CONTRAST  TECHNIQUE: Contiguous axial images were obtained from the base of the skull through the vertex without intravenous contrast. RADIATION DOSE REDUCTION: This exam was performed according to the departmental dose-optimization program which includes automated exposure control, adjustment of the mA and/or kV according to patient size and/or use of iterative reconstruction technique. COMPARISON:  MRI head without contrast 03/17/2021 FINDINGS: Brain: Remote left ACA territory infarct again noted. No acute infarct, hemorrhage, or mass lesion is present. No significant white matter lesions are present. Deep brain nuclei are within normal limits. The ventricles are of normal size. No significant extraaxial fluid collection is present. The brainstem and cerebellum are within normal limits. Vascular: Atherosclerotic calcifications are present within the cavernous internal carotid arteries bilaterally. No hyperdense vessel is present. Skull: A frontal scalp hematoma extends from just above the orbits to near the vertex. A left paramedian laceration is present. No underlying fracture or foreign body is present. Sinuses/Orbits: The paranasal sinuses and mastoid air cells are clear. Bilateral lens replacements are noted. Globes and orbits are otherwise unremarkable. IMPRESSION: 1. Frontal scalp hematoma and left paramedian laceration without underlying fracture or foreign body. 2. No acute intracranial abnormality or significant interval change. 3. Remote left ACA territory infarct. Electronically Signed: By: San Morelle M.D. On: 03/15/2022 15:29   CT Maxillofacial Wo Contrast  Addendum Date: 03/15/2022   ADDENDUM REPORT: 03/15/2022 18:00 ADDENDUM: The reports were inadvertently linked in the voice recognition system. The CT of the cervical spine and maxillofacial will be dictated here as an addendum. The findings were discussed with Dr. Regenia Skeeter Cervical spine: Slight degenerative anterolisthesis present at C4-5 and  C5-6. Slight degenerative anterolisthesis is also present at C7-T1. C1 ring is fractured anteriorly with 2 mm displacement. Second fracture is present in the right lamina. A type 1 dens fracture is noted anteriorly. The fracture may be incomplete. No additional fractures are present. No significant prevertebral fluid or soft tissue swelling is present. No hemorrhage is present in the canal. Atherosclerotic calcifications are present at the carotid bifurcations bilaterally. Stenosis is suspected on the left. Uncovertebral and facet hypertrophy result in multilevel foraminal stenosis, greatest at C6-7. A nodule of the right upper lobe is slightly decreased in size from the prior study, measuring 2.3 x 1.3 cm compared with 2.6 x 1.5 cm previously. No new nodule or mass lesion is present. Additional scarring is present bilaterally. Impressions: 1. Anterior and right lateral C1 fractures. 2. Incomplete type 1 dens fracture. 3. Multilevel degenerative change in the cervical spine. Maxillofacial CT No acute facial fractures are present. Mandible is intact and located. The C1 and type 1 dense fractures are again seen. Supraorbital soft tissue swelling is present. No underlying fracture is present. Bilateral lens replacements are noted. Globes and orbits are otherwise unremarkable. The paranasal sinuses and mastoid air cells are clear. Impressions: No  acute fractures of the face. C1 and C2 fractures as above. Electronically Signed   By: San Morelle M.D.   On: 03/15/2022 18:00   Result Date: 03/15/2022 CLINICAL DATA:  Head trauma, minor.  Fall.  Hit head on floor. EXAM: CT HEAD WITHOUT CONTRAST TECHNIQUE: Contiguous axial images were obtained from the base of the skull through the vertex without intravenous contrast. RADIATION DOSE REDUCTION: This exam was performed according to the departmental dose-optimization program which includes automated exposure control, adjustment of the mA and/or kV according to patient  size and/or use of iterative reconstruction technique. COMPARISON:  MRI head without contrast 03/17/2021 FINDINGS: Brain: Remote left ACA territory infarct again noted. No acute infarct, hemorrhage, or mass lesion is present. No significant white matter lesions are present. Deep brain nuclei are within normal limits. The ventricles are of normal size. No significant extraaxial fluid collection is present. The brainstem and cerebellum are within normal limits. Vascular: Atherosclerotic calcifications are present within the cavernous internal carotid arteries bilaterally. No hyperdense vessel is present. Skull: A frontal scalp hematoma extends from just above the orbits to near the vertex. A left paramedian laceration is present. No underlying fracture or foreign body is present. Sinuses/Orbits: The paranasal sinuses and mastoid air cells are clear. Bilateral lens replacements are noted. Globes and orbits are otherwise unremarkable. IMPRESSION: 1. Frontal scalp hematoma and left paramedian laceration without underlying fracture or foreign body. 2. No acute intracranial abnormality or significant interval change. 3. Remote left ACA territory infarct. Electronically Signed: By: San Morelle M.D. On: 03/15/2022 15:29     ASSESSMENT/PLAN:  This is a very pleasant 87 year old Caucasian female with stage IV (T2b, N0, M1 a) non-small cell lung cancer.  She was diagnosed in November 2022 and presented with a right upper lobe lung mass and an adjacent nodule in addition to a synchronous primary versus Ernst Breach a metastatic nodule in the left lower lobe.  She was diagnosed in November 2022.  She has no actionable mutations.  The patient underwent SBRT to these lesions under the care of Dr. Sondra Come. This was completed on 06/24/2021   The patient recently had a restaging CT scan performed.  Dr. Julien Nordmann personally independently reviewed the results and discussed results with the patient today.  The scan did not show  any evidence of disease progression except for slightly enlarging nodule in the RLLwhich warrants close monitoring.  Dr. Julien Nordmann recommends follow-up CT scan of the chest in 3 months.  We will see her back for follow-up visit a few days later to review the results.  We will arrange for the patient to have a handicap placard which is reasonable since she is on supplemental oxygen and cannot walk long distances without staying due to her pulmonary function.  The patient's portable oxygen tank is not adequately controlling her oxygen saturation.  We will reach out to pulmonary medicine and see if they know who her oxygen supplier is or she needs reevaluation for a different portable oxygen tank.   The patient was advised to call immediately if she has any concerning symptoms in the interval. The patient voices understanding of current disease status and treatment options and is in agreement with the current care plan. All questions were answered. The patient knows to call the clinic with any problems, questions or concerns. We can certainly see the patient much sooner if necessary   Orders Placed This Encounter  Procedures   CT Chest W Contrast    Standing Status:  Future    Standing Expiration Date:   04/08/2023    Order Specific Question:   If indicated for the ordered procedure, I authorize the administration of contrast media per Radiology protocol    Answer:   Yes    Order Specific Question:   Does the patient have a contrast media/X-ray dye allergy?    Answer:   No    Order Specific Question:   Preferred imaging location?    Answer:   [facility]   CBC with Differential ([place] Only)    Standing Status:   Future    Standing Expiration Date:   04/09/2023   CMP ([place] only)    Standing Status:   Future    Standing Expiration Date:   04/09/2023      Tobe Sos Dalinda Heidt, PA-C 04/08/22  ADDENDUM: Hematology/Oncology Attending: I had a face-to-face  encounter with the patient today.  I reviewed her record, lab, scan and recommended her care plan.  This is a very pleasant 87 years old white female with stage IV (T2b, N0, M1 A) non-small cell lung cancer diagnosed in November 2022 and presented with right upper lobe lung mass in addition to left upper lobe lung nodule and right lower lobe nodule.  She is status post SBRT to the left and right lung nodules under the care of Dr. Sondra Come. The patient is currently on observation and she is feeling fine with no concerning complaints except for the baseline shortness of breath and she is currently on home oxygen. She had repeat CT scan of the chest performed recently.  I personally and independently reviewed the scan and discussed results with the patient and her son.  Her scan showed no concerning findings for disease progression except for a slightly enlarged nodule in the right lower lobe that needs close monitoring. I recommended for the patient to continue on observation with repeat CT scan of the chest in 3 months. The patient was advised to call immediately if she has any other concerning symptoms in the interval. The total time spent in the appointment was 30 minutes. Disclaimer: This note was dictated with voice recognition software. Similar sounding words can inadvertently be transcribed and may be missed upon review. Eilleen Kempf, MD

## 2022-04-08 ENCOUNTER — Inpatient Hospital Stay: Payer: Medicare Other | Attending: Physician Assistant | Admitting: Physician Assistant

## 2022-04-08 ENCOUNTER — Other Ambulatory Visit: Payer: Self-pay

## 2022-04-08 VITALS — BP 127/63 | HR 81 | Temp 97.6°F | Resp 18 | Wt 136.7 lb

## 2022-04-08 DIAGNOSIS — K219 Gastro-esophageal reflux disease without esophagitis: Secondary | ICD-10-CM | POA: Diagnosis not present

## 2022-04-08 DIAGNOSIS — C3411 Malignant neoplasm of upper lobe, right bronchus or lung: Secondary | ICD-10-CM | POA: Diagnosis present

## 2022-04-08 DIAGNOSIS — J449 Chronic obstructive pulmonary disease, unspecified: Secondary | ICD-10-CM | POA: Diagnosis not present

## 2022-04-08 DIAGNOSIS — I2510 Atherosclerotic heart disease of native coronary artery without angina pectoris: Secondary | ICD-10-CM | POA: Diagnosis not present

## 2022-04-08 DIAGNOSIS — I700 Atherosclerosis of aorta: Secondary | ICD-10-CM | POA: Insufficient documentation

## 2022-04-08 DIAGNOSIS — Z923 Personal history of irradiation: Secondary | ICD-10-CM | POA: Diagnosis not present

## 2022-04-08 DIAGNOSIS — Z79899 Other long term (current) drug therapy: Secondary | ICD-10-CM | POA: Diagnosis not present

## 2022-04-29 ENCOUNTER — Telehealth: Payer: Self-pay | Admitting: Internal Medicine

## 2022-04-29 NOTE — Telephone Encounter (Signed)
Called patient to r/s upcoming appointments. Patient notified.

## 2022-04-30 ENCOUNTER — Other Ambulatory Visit (HOSPITAL_COMMUNITY): Payer: Self-pay | Admitting: Neurological Surgery

## 2022-04-30 ENCOUNTER — Telehealth: Payer: Self-pay

## 2022-04-30 DIAGNOSIS — S12091A Other nondisplaced fracture of first cervical vertebra, initial encounter for closed fracture: Secondary | ICD-10-CM

## 2022-04-30 NOTE — Telephone Encounter (Signed)
This nurse received a call from this patients daughter that patient has been satting in the 67's and needs to wear oxygen all the time.  Patient is still able bodied and working 6 days a week and she has oxygen tanks at home but she is refusing to carry the tanks.  Daughter would like to know if an order can be sent in to her oxygen supplier for a portable oxygen concentrator that she can carry around all the time.  This nurse advised that she would send a message to her Pulmonologist office who submitted the original order for her DME oxygen.  She acknowledged understanding.  No further questions or concerns noted at this time.

## 2022-05-05 ENCOUNTER — Telehealth: Payer: Self-pay | Admitting: Acute Care

## 2022-05-05 NOTE — Telephone Encounter (Signed)
Staff messages sent: Message Received: Hessie Diener, NP  Estella Husk, LPN; Ashland Wiseman, Waldemar Dickens, CMA Raquel Sarna, can we get this patient in for a walk on the Inogen to see if it would be adequate for this patient ? Thanks       Previous Messages    ----- Message ----- From: Estella Husk, LPN Sent: [id number]/67/5916  12:57 PM EST To: Magdalen Spatz, NP  Good afternoon Ms. Elie Confer,  I am reaching out to you on behalf of this patient.  Her daughter called in stating that she is very concerned about her mothers oxygen levels.  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  The daughter is wondering if it would be possible to get an order for this patient to get a portable oxygen tank from her DME provider?  I am reaching out because I do see in your note from 11/24/21 that you were going to have the DME come out for servicing.  Our provider is not in the office today but I did want to make sure the her Pulmonologist is aware and to see if we can get an order submitted for her.  Thank you so much for your time.  Have a great day and a wonderful New Year!!!   Joy P. LPN Nurse for Dr. Deanna Artis Heilingoetter PA-C      Attempted to call pt's daughter Freda Munro to see if we could get pt scheduled for an office visit to then be able to do the walk using Inogen but unable to reach. Left message for her to return call.

## 2022-06-18 ENCOUNTER — Other Ambulatory Visit (HOSPITAL_COMMUNITY): Payer: Self-pay | Admitting: Physician Assistant

## 2022-06-22 ENCOUNTER — Ambulatory Visit (HOSPITAL_COMMUNITY)
Admission: RE | Admit: 2022-06-22 | Discharge: 2022-06-22 | Disposition: A | Discharge status: Home / Self Care | Payer: Medicare Other | Source: Ambulatory Visit | Attending: Neurological Surgery | Admitting: Neurological Surgery

## 2022-06-22 DIAGNOSIS — S12091A Other nondisplaced fracture of first cervical vertebra, initial encounter for closed fracture: Secondary | ICD-10-CM | POA: Insufficient documentation

## 2022-07-08 ENCOUNTER — Ambulatory Visit (HOSPITAL_COMMUNITY)
Admission: RE | Admit: 2022-07-08 | Discharge: 2022-07-08 | Disposition: A | Discharge status: Home / Self Care | Payer: Medicare Other | Source: Ambulatory Visit | Attending: Physician Assistant | Admitting: Physician Assistant

## 2022-07-08 DIAGNOSIS — C3411 Malignant neoplasm of upper lobe, right bronchus or lung: Secondary | ICD-10-CM | POA: Diagnosis present

## 2022-07-08 MED ORDER — IOHEXOL 300 MG/ML  SOLN
75.0000 mL | Freq: Once | INTRAMUSCULAR | Status: AC | PRN
Start: 2022-07-08 — End: 2022-07-08
  Administered 2022-07-08: 75 mL via INTRAVENOUS

## 2022-07-12 ENCOUNTER — Inpatient Hospital Stay: Payer: Medicare Other | Attending: Physician Assistant

## 2022-07-12 ENCOUNTER — Other Ambulatory Visit: Payer: Self-pay

## 2022-07-12 DIAGNOSIS — C3411 Malignant neoplasm of upper lobe, right bronchus or lung: Secondary | ICD-10-CM | POA: Insufficient documentation

## 2022-07-12 DIAGNOSIS — Z923 Personal history of irradiation: Secondary | ICD-10-CM | POA: Diagnosis not present

## 2022-07-12 LAB — CBC WITH DIFFERENTIAL (CANCER CENTER ONLY)
Abs Immature Granulocytes: 0.01 10*3/uL (ref 0.00–0.07)
Basophils Absolute: 0.1 10*3/uL (ref 0.0–0.1)
Basophils Relative: 1 %
Eosinophils Absolute: 0.1 10*3/uL (ref 0.0–0.5)
Eosinophils Relative: 2 %
HCT: 39 % (ref 36.0–46.0)
Hemoglobin: 13 g/dL (ref 12.0–15.0)
Immature Granulocytes: 0 %
Lymphocytes Relative: 28 %
Lymphs Abs: 1.8 10*3/uL (ref 0.7–4.0)
MCH: 33.9 pg (ref 26.0–34.0)
MCHC: 33.3 g/dL (ref 30.0–36.0)
MCV: 101.6 fL — ABNORMAL HIGH (ref 80.0–100.0)
Monocytes Absolute: 0.7 10*3/uL (ref 0.1–1.0)
Monocytes Relative: 11 %
Neutro Abs: 3.8 10*3/uL (ref 1.7–7.7)
Neutrophils Relative %: 58 %
Platelet Count: 191 10*3/uL (ref 150–400)
RBC: 3.84 MIL/uL — ABNORMAL LOW (ref 3.87–5.11)
RDW: 12.9 % (ref 11.5–15.5)
WBC Count: 6.5 10*3/uL (ref 4.0–10.5)
nRBC: 0 % (ref 0.0–0.2)

## 2022-07-12 LAB — CMP (CANCER CENTER ONLY)
ALT: 44 U/L (ref 0–44)
AST: 42 U/L — ABNORMAL HIGH (ref 15–41)
Albumin: 3.9 g/dL (ref 3.5–5.0)
Alkaline Phosphatase: 114 U/L (ref 38–126)
Anion gap: 3 — ABNORMAL LOW (ref 5–15)
BUN: 16 mg/dL (ref 8–23)
CO2: 36 mmol/L — ABNORMAL HIGH (ref 22–32)
Calcium: 9.1 mg/dL (ref 8.9–10.3)
Chloride: 100 mmol/L (ref 98–111)
Creatinine: 0.57 mg/dL (ref 0.44–1.00)
GFR, Estimated: >60 mL/min (ref >60)
Glucose, Bld: 87 mg/dL (ref 70–99)
Potassium: 4.4 mmol/L (ref 3.5–5.1)
Sodium: 139 mmol/L (ref 135–145)
Total Bilirubin: 0.6 mg/dL (ref 0.3–1.2)
Total Protein: 6.7 g/dL (ref 6.5–8.1)

## 2022-07-15 ENCOUNTER — Telehealth: Payer: Self-pay | Admitting: Medical Oncology

## 2022-07-15 ENCOUNTER — Ambulatory Visit: Payer: Medicare Other | Admitting: Internal Medicine

## 2022-07-15 ENCOUNTER — Inpatient Hospital Stay (HOSPITAL_BASED_OUTPATIENT_CLINIC_OR_DEPARTMENT_OTHER): Payer: Medicare Other | Admitting: Internal Medicine

## 2022-07-15 VITALS — BP 162/66 | HR 73 | Temp 98.3°F | Resp 17 | Wt 136.5 lb

## 2022-07-15 DIAGNOSIS — C3490 Malignant neoplasm of unspecified part of unspecified bronchus or lung: Secondary | ICD-10-CM

## 2022-07-15 DIAGNOSIS — C3411 Malignant neoplasm of upper lobe, right bronchus or lung: Secondary | ICD-10-CM | POA: Diagnosis not present

## 2022-07-15 NOTE — Telephone Encounter (Signed)
Internal medicine appt- I left VM for dtr ,Freda Munro,  to return my call.

## 2022-07-15 NOTE — Progress Notes (Signed)
[place] [phone]   [phone]) [phone]  OFFICE PROGRESS NOTE  Pcp, No No address on file  DIAGNOSIS:  Stage IV (T2b, N0, M1 a) non-small cell lung cancer with unclear subtype on the pathology report diagnosed in November 2022 and presented with right upper lobe lung mass as well as right upper lobe nodule in addition to right lower lobe nodule.  DETECTED ALTERATION(S) / BIOMARKER(S) % CFDNA OR AMPLIFICATION ASSOCIATED FDA-APPROVED THERAPIES CLINICAL TRIAL AVAILABILITY [id number] 0.1% None No  PRIOR THERAPY: SBRT to the left and right lung nodules under the care of Dr. KSondra Comecompleted on June 24, 2021.  CURRENT THERAPY: Observation  INTERVAL HISTORY: Amy Ocana88y.o. female returns to the clinic today for follow-up visit.  The patient is feeling fine today with no concerning complaints except for the baseline shortness of breath and she is currently on home oxygen.  She denied having any chest pain, cough or hemoptysis.  She has no nausea, vomiting, diarrhea or constipation.  She has no headache or visual changes.  [facility] care with a primary care physician but she has not received any appointment from them.  The patient is here today for evaluation with repeat CT scan of the chest for restaging of her disease.  MEDICAL HISTORY: Past Medical History:  Diagnosis Date   COPD (chronic obstructive pulmonary disease) (HCC)    Dyspnea    GERD (gastroesophageal reflux disease)    History of radiation therapy    RUL lung and LLL lung 06/15/2021-06/24/2021  Dr JGery Pray  Non-small cell lung cancer ([facility]) [place] 12[place]   Pneumonia     ALLERGIES:  is allergic to penicillins.  MEDICATIONS:  Current Outpatient Medications  Medication Sig Dispense Refill   Budeson-Glycopyrrol-Formoterol (BREZTRI AEROSPHERE) 160-9-4.8 MCG/ACT AERO Inhale 2 puffs into the lungs in the  morning and at bedtime. 10.7 g 0   naproxen sodium (ALEVE) 220 MG tablet Take 220 mg by mouth as needed.     OXYGEN Inhale 3 L into the lungs as needed. When 02 sats drop below 92     No current facility-administered medications for this visit.    SURGICAL HISTORY:  Past Surgical History:  Procedure Laterality Date   BRONCHIAL BIOPSY  03/09/2021   Procedure: BRONCHIAL BIOPSIES;  Surgeon: BCollene Gobble MD;  [facility];  Service: Pulmonary;;   BRONCHIAL BRUSHINGS  03/09/2021   Procedure: BRONCHIAL BRUSHINGS;  Surgeon: BCollene Gobble MD;  [facility];  Service: Pulmonary;;   BRONCHIAL NEEDLE ASPIRATION BIOPSY  03/09/2021   Procedure: BRONCHIAL NEEDLE ASPIRATION BIOPSIES;  Surgeon: BCollene Gobble MD;  Location: [facility] ENDOSCOPY;  Service: Pulmonary;;   DILATION AND CURETTAGE OF UTERUS     EYE SURGERY     bilateral cataract removal   TONSILLECTOMY     VIDEO BRONCHOSCOPY WITH ENDOBRONCHIAL NAVIGATION Bilateral 03/09/2021   Procedure: VIDEO BRONCHOSCOPY WITH ENDOBRONCHIAL NAVIGATION;  Surgeon: BCollene Gobble MD;  [facility];  Service: Pulmonary;  Laterality: Bilateral;  ION w/ possible fiducial    REVIEW OF SYSTEMS:  A comprehensive review of systems was negative except for: Constitutional: positive for fatigue Respiratory: positive for dyspnea on exertion   PHYSICAL EXAMINATION: General appearance: alert, cooperative, fatigued, and no distress Head: Normocephalic, without obvious abnormality, atraumatic Neck: no adenopathy, no JVD, supple, symmetrical, trachea midline, and thyroid not enlarged, symmetric, no tenderness/mass/nodules Lymph nodes: Cervical, supraclavicular, and axillary  nodes normal. Resp: clear to auscultation bilaterally Back: symmetric, no curvature. ROM normal. No CVA tenderness. Cardio: regular rate and rhythm, S1, S2 normal, no murmur, click, rub or gallop GI: soft, non-tender; bowel sounds normal; no masses,  no  organomegaly Extremities: extremities normal, atraumatic, no cyanosis or edema  ECOG PERFORMANCE STATUS: 1 - Symptomatic but completely ambulatory  Blood pressure (!) 162/66, pulse 73, temperature 98.3 F (36.8 C), temperature source Oral, resp. rate 17, weight 136 lb 8 oz (61.9 kg), SpO2 90 %.  LABORATORY DATA: Lab Results  Component Value Date   WBC 6.5 07/12/2022   HGB 13.0 07/12/2022   HCT 39.0 07/12/2022   MCV 101.6 (H) 07/12/2022   PLT 191 07/12/2022      Chemistry      Component Value Date/Time   NA 139 07/12/2022 1309   K 4.4 07/12/2022 1309   CL 100 07/12/2022 1309   CO2 36 (H) 07/12/2022 1309   BUN 16 07/12/2022 1309   CREATININE 0.57 07/12/2022 1309      Component Value Date/Time   CALCIUM 9.1 07/12/2022 1309   ALKPHOS 114 07/12/2022 1309   AST 42 (H) 07/12/2022 1309   ALT 44 07/12/2022 1309   BILITOT 0.6 07/12/2022 1309       RADIOGRAPHIC STUDIES: CT Chest W Contrast  Result Date: 07/09/2022 CLINICAL DATA:  88 year old female with history of non-small cell lung cancer. Evaluate for treatment response. * Tracking Code: BO * EXAM: CT CHEST WITH CONTRAST TECHNIQUE: Multidetector CT imaging of the chest was performed during intravenous contrast administration. RADIATION DOSE REDUCTION: This exam was performed according to the departmental dose-optimization program which includes automated exposure control, adjustment of the mA and/or kV according to patient size and/or use of iterative reconstruction technique. CONTRAST:  35m OMNIPAQUE IOHEXOL 300 MG/ML  SOLN COMPARISON:  Chest CT 03/22/2022. FINDINGS: Cardiovascular: Heart size is normal. There is no significant pericardial fluid, thickening or pericardial calcification. There is aortic atherosclerosis, as well as atherosclerosis of the great vessels of the mediastinum and the coronary arteries, including calcified atherosclerotic plaque in the left main and left anterior descending coronary arteries.  Mediastinum/Nodes: No pathologically enlarged mediastinal or hilar lymph nodes. Esophagus is unremarkable in appearance. No axillary lymphadenopathy. Lungs/Pleura: Macrolobulated nodule with spiculated margins in the left lower lobe (axial image 84 of series 7), unchanged, currently measuring 1.9 x 1.2 cm. Right upper lobe nodule near the apex (axial image 22 of series 7) measuring 2.0 x 1.5 cm, also unchanged. More posteriorly in the right upper lobe (axial image 28 of series 7) there is a treated nodule which measures 1.4 x 1.2 cm, as well as some adjacent mass-like architectural distortion, all of which is unchanged compared to the prior study. No other new suspicious appearing pulmonary nodules or masses are noted. No acute consolidative airspace disease. No pleural effusions. Diffuse bronchial wall thickening with mild centrilobular and paraseptal emphysema. Upper Abdomen: Aortic atherosclerosis. 1.9 cm low-attenuation lesion in the upper pole the left kidney is compatible with a simple cyst (no imaging follow-up recommended). Musculoskeletal: Chronic appearing compression fracture of superior endplate of T3 with less than 20% loss of anterior vertebral body height. 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 There are no aggressive appearing lytic or blastic lesions noted in the visualized portions of the skeleton. IMPRESSION: 1. Treated pulmonary nodules in the lungs bilaterally appear grossly stable compared to the prior examination. No definitive findings to suggest recurrent or metastatic disease on today's examination. Continued attention on  follow-up studies is recommended to ensure continued stability. 2. Aortic atherosclerosis, in addition to left main and left anterior descending coronary artery disease. Aortic Atherosclerosis (ICD10-I70.0). Electronically Signed   By: Vinnie Langton M.D.   On: 07/09/2022 11:14   CT CERVICAL SPINE WO CONTRAST  Result Date:  06/22/2022 CLINICAL DATA:  Nondisplaced fracture of first cervical vertebra, initial encounter. Fell in November of 2023 with cervical collar treatment since then. EXAM: CT CERVICAL SPINE WITHOUT CONTRAST TECHNIQUE: Multidetector CT imaging of the cervical spine was performed without intravenous contrast. Multiplanar CT image reconstructions were also generated. RADIATION DOSE REDUCTION: This exam was performed according to the departmental dose-optimization program which includes automated exposure control, adjustment of the mA and/or kV according to patient size and/or use of iterative reconstruction technique. COMPARISON:  03/15/2022 FINDINGS: Alignment: No significant malalignment. Straightening of the normal cervical lordosis. Mild scoliotic curvature. Skull base and vertebrae: Healing or near complete healing of a nondisplaced fracture along the anterior cortical margin of the base of the dens. Healing or near complete healing of nondisplaced fractures of the anterior arch of C1 in the left lamina of C1. Sclerotic, healed appearances of an inferior endplate fracture at T2 in superior endplate fracture at T3. These were probably also acute in November of last year. Soft tissues and spinal canal: No significant soft tissue neck finding. Disc levels: Chronic degenerative spondylosis at C5-6 and C6-7 with mild bilateral foraminal narrowing, unchanged. Upper chest: Stable post treatment densities at the right apex when compared to a chest CT 03/22/2022. Other: None IMPRESSION: 1. Healing or near complete healing of a nondisplaced fracture along the anterior cortical margin of the base of the dens. There is bone bridging, though the previous fracture lines can still be visualized in a subtle fashion. This can be the case for quite some time after successful healing. 2. Healing or near complete healing of nondisplaced fractures of the anterior arch of C1 and the left lamina of C1. 3. Sclerotic, healed appearances of  an inferior endplate fracture at T2 and superior endplate fracture at T3. These were probably acute in November of last year. Electronically Signed   By: Nelson Chimes M.D.   On: 06/22/2022 14:37    ASSESSMENT AND PLAN: This is a very pleasant 88 years old white female with a stage IV (T2b, N0, M1 a) non-small cell lung cancer diagnosed in November 2022 and presented with right upper lobe lung mass and adjacent nodule in addition to synchronous primary versus oligometastatic nodule in the left lower lobe diagnosed and November 2022. The patient status post SBRT to the left and right lung nodule under the care of Dr. Sondra Come. She is currently on observation and she is feeling fine with no concerning complaints. She had repeat CT scan of the chest that showed no concerning findings for disease recurrence or progression. I recommended for her to continue on observation with repeat CT scan of the chest in 6 months. The patient was advised to call immediately if she has any concerning symptoms in the interval. The patient voices understanding of current disease status and treatment options and is in agreement with the current care plan.  All questions were answered. The patient knows to call the clinic with any problems, questions or concerns. We can certainly see the patient much sooner if necessary.   Disclaimer: This note was dictated with voice recognition software. Similar sounding words can inadvertently be transcribed and may not be corrected upon review.

## 2022-07-15 NOTE — Telephone Encounter (Signed)
Internal med appt -new pt appt on 4/16 at 0830 ( per Susette Racer @ [facility]). I confirmed appt with grandaughter. She will contact pts daughters with the information.

## 2022-08-17 ENCOUNTER — Ambulatory Visit (INDEPENDENT_AMBULATORY_CARE_PROVIDER_SITE_OTHER): Payer: Medicare Other | Admitting: Internal Medicine

## 2022-08-17 ENCOUNTER — Encounter: Payer: Self-pay | Admitting: Internal Medicine

## 2022-08-17 ENCOUNTER — Ambulatory Visit (INDEPENDENT_AMBULATORY_CARE_PROVIDER_SITE_OTHER): Payer: Medicare Other

## 2022-08-17 VITALS — BP 131/73 | HR 76 | Temp 98.3°F | Ht 64.0 in | Wt 138.4 lb

## 2022-08-17 DIAGNOSIS — R7989 Other specified abnormal findings of blood chemistry: Secondary | ICD-10-CM

## 2022-08-17 DIAGNOSIS — I5032 Chronic diastolic (congestive) heart failure: Secondary | ICD-10-CM

## 2022-08-17 DIAGNOSIS — C3411 Malignant neoplasm of upper lobe, right bronchus or lung: Secondary | ICD-10-CM | POA: Diagnosis not present

## 2022-08-17 DIAGNOSIS — D696 Thrombocytopenia, unspecified: Secondary | ICD-10-CM

## 2022-08-17 DIAGNOSIS — E039 Hypothyroidism, unspecified: Secondary | ICD-10-CM

## 2022-08-17 DIAGNOSIS — M810 Age-related osteoporosis without current pathological fracture: Secondary | ICD-10-CM

## 2022-08-17 DIAGNOSIS — J439 Emphysema, unspecified: Secondary | ICD-10-CM

## 2022-08-17 DIAGNOSIS — Z0000 Encounter for general adult medical examination without abnormal findings: Secondary | ICD-10-CM

## 2022-08-17 DIAGNOSIS — S12001D Unspecified nondisplaced fracture of first cervical vertebra, subsequent encounter for fracture with routine healing: Secondary | ICD-10-CM

## 2022-08-17 DIAGNOSIS — J449 Chronic obstructive pulmonary disease, unspecified: Secondary | ICD-10-CM | POA: Insufficient documentation

## 2022-08-17 DIAGNOSIS — F17210 Nicotine dependence, cigarettes, uncomplicated: Secondary | ICD-10-CM | POA: Diagnosis not present

## 2022-08-17 DIAGNOSIS — I700 Atherosclerosis of aorta: Secondary | ICD-10-CM

## 2022-08-17 HISTORY — DX: Chronic obstructive pulmonary disease, unspecified: J44.9

## 2022-08-17 NOTE — Patient Instructions (Signed)
Ms. Hunkele,  I enjoyed meeting you today!  It will be fun to get to know you and to hear more about your travels.  We discussed your few medical concerns and identified breathing as the one issue which is holding you back from the level of activity you'd like to be doing.  We are checking your thyroid test today, since it was abnormal a couple of years ago and this needs to be addressed.  I'll send a message in MyChart to comment on results.  I sent a message to NP Groce inquiring about breathing medicines and follow up.  I'll let you know what her recommendations are.  Let's get together in 6 M by telehealth to check on things.  I'm available on MyChart messaging in the meantime.  Take care and stay well!  Dr. Mayford Knife

## 2022-08-17 NOTE — Progress Notes (Signed)
Subjective:   Amy Sandoval is a 88 y.o. female who presents for an Initial Medicare Annual Wellness Visit. I connected with  [name] on 08/17/22 by a  Face-To-Face encounter  and verified that I am speaking with the correct person using two identifiers.  Patient Location: Other:  Office/Clinic  Provider Location: Office/Clinic  I discussed the limitations of evaluation and management by telemedicine. The patient expressed understanding and agreed to proceed.  Review of Systems    Defer to PCP       Objective:    Today's Vitals   08/17/22 1104 08/17/22 1105  BP: 131/73   Pulse: 76   Temp: 98.3 F (36.8 C)   TempSrc: Oral   SpO2: 94%   Weight: 138 lb 6.4 oz (62.8 kg)   Height: 5\' 4"  (1.626 m)   PainSc: 0-No pain 0-No pain   Body mass index is 23.76 kg/m.     08/17/2022   11:05 AM 08/17/2022    8:55 AM 03/15/2022    2:35 PM 08/18/2021    1:04 PM 08/13/2021    3:37 PM 05/25/2021    8:56 AM 04/01/2021    9:16 AM  Advanced Directives  Does Patient Have a Medical Advance Directive? No No No No No No No  Would patient like information on creating a medical advance directive? No - Patient declined No - Patient declined Yes (ED - Information included in AVS) No - Patient declined No - Patient declined No - Patient declined     Current Medications (verified) Outpatient Encounter Medications as of 08/17/2022  Medication Sig   Budeson-Glycopyrrol-Formoterol (BREZTRI AEROSPHERE) 160-9-4.8 MCG/ACT AERO Inhale 2 puffs into the lungs in the morning and at bedtime.   naproxen sodium (ALEVE) 220 MG tablet Take 220 mg by mouth as needed.   OXYGEN Inhale 3 L into the lungs as needed. When 02 sats drop below 92   No facility-administered encounter medications on file as of 08/17/2022.    Allergies (verified) Penicillins   History: Past Medical History:  Diagnosis Date   COPD (chronic obstructive pulmonary disease)    History of radiation therapy    RUL lung and LLL  lung 06/15/2021-06/24/2021  Dr Antony Blackbird   Non-small cell lung cancer (NSCLC) 12/2020   Pneumonia    Sepsis 12/18/2020   Past Surgical History:  Procedure Laterality Date   BRONCHIAL BIOPSY  03/09/2021   Procedure: BRONCHIAL BIOPSIES;  Surgeon: Leslye Peer, MD;  Location: [facility] ENDOSCOPY;  Service: Pulmonary;;   BRONCHIAL BRUSHINGS  03/09/2021   Procedure: BRONCHIAL BRUSHINGS;  Surgeon: Leslye Peer, MD;  Location: [facility] ENDOSCOPY;  Service: Pulmonary;;   BRONCHIAL NEEDLE ASPIRATION BIOPSY  03/09/2021   Procedure: BRONCHIAL NEEDLE ASPIRATION BIOPSIES;  Surgeon: Leslye Peer, MD;  Location: [facility] ENDOSCOPY;  Service: Pulmonary;;   DILATION AND CURETTAGE OF UTERUS     EYE SURGERY     bilateral cataract removal   TONSILLECTOMY     VIDEO BRONCHOSCOPY WITH ENDOBRONCHIAL NAVIGATION Bilateral 03/09/2021   Procedure: VIDEO BRONCHOSCOPY WITH ENDOBRONCHIAL NAVIGATION;  Surgeon: Leslye Peer, MD;  Location: [facility] ENDOSCOPY;  Service: Pulmonary;  Laterality: Bilateral;  ION w/ possible fiducial   Family History  Problem Relation Age of Onset   Hypertension Other    Social History   Socioeconomic History   Marital status: Single    Spouse name: Not on file   Number of children: Not on file   Years of education: Not on file   Highest education  level: Not on file  Occupational History   Not on file  Tobacco Use   Smoking status: Some Days    Packs/day: 1.00    Years: 60.00    Additional pack years: 0.00    Total pack years: 60.00    Types: Cigarettes    Passive exposure: Past   Smokeless tobacco: Never  Vaping Use   Vaping Use: Former   Substances: Nicotine, Flavoring  Substance and Sexual Activity   Alcohol use: Yes    Comment: Social   Drug use: Never   Sexual activity: Not on file  Other Topics Concern   Not on file  Social History Narrative   Not on file   Social Determinants of Health   Financial Resource Strain: Low Risk  (08/17/2022)   Overall Financial Resource Strain  (CARDIA)    Difficulty of Paying Living Expenses: Not hard at all  Food Insecurity: No Food Insecurity (08/17/2022)   Hunger Vital Sign    Worried About Running Out of Food in the Last Year: Never true    Ran Out of Food in the Last Year: Never true  Transportation Needs: No Transportation Needs (08/17/2022)   PRAPARE - Administrator, Civil Service (Medical): No    Lack of Transportation (Non-Medical): No  Physical Activity: Inactive (08/17/2022)   Exercise Vital Sign    Days of Exercise per Week: 0 days    Minutes of Exercise per Session: 0 min  Stress: No Stress Concern Present (08/17/2022)   [employer] of Occupational Health - Occupational Stress Questionnaire    Feeling of Stress : Not at all  Social Connections: Socially Isolated (08/17/2022)   Social Connection and Isolation Panel [NHANES]    Frequency of Communication with Friends and Family: More than three times a week    Frequency of Social Gatherings with Friends and Family: Once a week    Attends Religious Services: Never    Database administrator or Organizations: No    Attends Engineer, structural: Never    Marital Status: Divorced    Tobacco Counseling Ready to quit: Not Answered Counseling given: Not Answered   Clinical Intake:  Pre-visit preparation completed: Yes  Pain : No/denies pain Pain Score: 0-No pain     Nutritional Risks: None Diabetes: No  How often do you need to have someone help you when you read instructions, pamphlets, or other written materials from your doctor or pharmacy?: 1 - Never What is the last grade level you completed in school?: Amy Sandoval,cma  Diabetic?No  Interpreter Needed?: No  Information entered by :: Naythen Heikkila,cma   Activities of Daily Living    08/17/2022   11:06 AM 08/17/2022    8:54 AM  In your present state of health, do you have any difficulty performing the following activities:  Hearing? 0 0  Vision? 0 0  Difficulty  concentrating or making decisions? 0 0  Walking or climbing stairs? 0 0  Dressing or bathing? 0 0  Doing errands, shopping? 0 0    Patient Care Team: Miguel Aschoff, MD as PCP - General (Internal Medicine) Antony Blackbird, MD as Consulting Physician (Radiation Oncology)  Indicate any recent Medical Services you may have received from other than [facility] providers in the past year (date may be approximate).     Assessment:   This is a routine wellness examination for [place].  Hearing/Vision screen No results found.  Dietary issues and exercise activities discussed:  Goals Addressed   None   Depression Screen    08/17/2022   11:06 AM 08/17/2022   10:56 AM  PHQ 2/9 Scores  PHQ - 2 Score 0 0    Fall Risk    08/17/2022   11:05 AM 08/17/2022    8:54 AM  Fall Risk   Falls in the past year? 1 1  Number falls in past yr: 1 0  Injury with Fall? 0 1  Follow up Falls evaluation completed;Falls prevention discussed Falls evaluation completed    FALL RISK PREVENTION PERTAINING TO THE HOME:  Any stairs in or around the home? Yes  If so, are there any without handrails? No  Home free of loose throw rugs in walkways, pet beds, electrical cords, etc? No  Adequate lighting in your home to reduce risk of falls? Yes   ASSISTIVE DEVICES UTILIZED TO PREVENT FALLS:  Life alert? No  Use of a cane, walker or w/c? No  Grab bars in the bathroom? No  Shower chair or bench in shower? No  Elevated toilet seat or a handicapped toilet? No   TIMED UP AND GO:  Was the test performed? No .  Length of time to ambulate 10 feet: 0 sec.   Gait slow and steady without use of assistive device  Cognitive Function:        Immunizations Immunization History  Administered Date(s) Administered   PFIZER Comirnaty(Gray Top)Covid-19 Tri-Sucrose Vaccine 02/01/2020, 03/23/2020, 10/01/2020, 03/30/2021    TDAP status: Due, Education has been provided regarding the importance of this vaccine.  Advised may receive this vaccine at local pharmacy or Health Dept. Aware to provide a copy of the vaccination record if obtained from local pharmacy or Health Dept. Verbalized acceptance and understanding.  Flu Vaccine status: Up to date  Pneumococcal vaccine status: Due, Education has been provided regarding the importance of this vaccine. Advised may receive this vaccine at local pharmacy or Health Dept. Aware to provide a copy of the vaccination record if obtained from local pharmacy or Health Dept. Verbalized acceptance and understanding.  Covid-19 vaccine status: Completed vaccines  Qualifies for Shingles Vaccine? No   Zostavax completed No   Shingrix Completed?: No.    Education has been provided regarding the importance of this vaccine. Patient has been advised to call insurance company to determine out of pocket expense if they have not yet received this vaccine. Advised may also receive vaccine at local pharmacy or Health Dept. Verbalized acceptance and understanding.  Screening Tests Health Maintenance  Topic Date Due   Pneumonia Vaccine 40+ Years old (1 of 2 - PCV) Never done   DTaP/Tdap/Td (1 - Tdap) Never done   Zoster Vaccines- Shingrix (1 of 2) Never done   DEXA SCAN  Never done   COVID-19 Vaccine (5 - 2023-24 season) 01/01/2022   INFLUENZA VACCINE  12/02/2022   Medicare Annual Wellness (AWV)  08/17/2023   HPV VACCINES  Aged Out    Health Maintenance  Health Maintenance Due  Topic Date Due   Pneumonia Vaccine 68+ Years old (1 of 2 - PCV) Never done   DTaP/Tdap/Td (1 - Tdap) Never done   Zoster Vaccines- Shingrix (1 of 2) Never done   DEXA SCAN  Never done   COVID-19 Vaccine (5 - 2023-24 season) 01/01/2022    Lung Cancer Screening: (Low Dose CT Chest recommended if Age 62-80 years, 30 pack-year currently smoking OR have quit w/in 15years.) does not qualify.   Lung Cancer Screening Referral: N/A  Additional Screening:  Hepatitis C Screening: does not qualify;  Completed N/A  Vision Screening: Recommended annual ophthalmology exams for early detection of glaucoma and other disorders of the eye. Is the patient up to date with their annual eye exam?  Yes  Who is the provider or what is the name of the office in which the patient attends annual eye exams?  If pt is not established with a provider, would they like to be referred to a provider to establish care? No .   Dental Screening: Recommended annual dental exams for proper oral hygiene  Community Resource Referral / Chronic Care Management: CRR required this visit?  No   CCM required this visit?  No      Plan:     I have personally reviewed and noted the following in the patient's chart:   Medical and social history Use of alcohol, tobacco or illicit drugs  Current medications and supplements including opioid prescriptions. Patient is not currently taking opioid prescriptions. Functional ability and status Nutritional status Physical activity Advanced directives List of other physicians Hospitalizations, surgeries, and ER visits in previous 12 months Vitals Screenings to include cognitive, depression, and falls Referrals and appointments  In addition, I have reviewed and discussed with patient certain preventive protocols, quality metrics, and best practice recommendations. A written personalized care plan for preventive services as well as general preventive health recommendations were provided to patient.     Cala Bradford, [facility]   08/17/2022   Nurse Notes: Face-To-Face Visit  Ms. Stepter , Thank you for taking time to come for your Medicare Wellness Visit. I appreciate your ongoing commitment to your health goals. Please review the following plan we discussed and let me know if I can assist you in the future.   These are the goals we discussed:  Goals   None     This is a list of the screening recommended for you and due dates:  Health Maintenance  Topic Date Due    Pneumonia Vaccine (1 of 2 - PCV) Never done   DTaP/Tdap/Td vaccine (1 - Tdap) Never done   Zoster (Shingles) Vaccine (1 of 2) Never done   DEXA scan (bone density measurement)  Never done   COVID-19 Vaccine (5 - 2023-24 season) 01/01/2022   Flu Shot  12/02/2022   Medicare Annual Wellness Visit  08/17/2023   HPV Vaccine  Aged Out
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
## 2022-08-19 LAB — TSH: TSH: 4.81 u[IU]/mL — ABNORMAL HIGH (ref 0.450–4.500)

## 2022-08-20 ENCOUNTER — Encounter: Payer: Self-pay | Admitting: Internal Medicine

## 2022-08-20 DIAGNOSIS — S129XXA Fracture of neck, unspecified, initial encounter: Secondary | ICD-10-CM

## 2022-08-20 DIAGNOSIS — M810 Age-related osteoporosis without current pathological fracture: Secondary | ICD-10-CM | POA: Insufficient documentation

## 2022-08-20 HISTORY — DX: Fracture of neck, unspecified, initial encounter: S12.9XXA

## 2022-08-20 NOTE — Assessment & Plan Note (Signed)
Didn't catch finding in time to order vit D level or discuss with her.  Pursue next visit.

## 2022-08-20 NOTE — Assessment & Plan Note (Signed)
Mild eleveation TSH noted 2022; recheck.

## 2022-08-20 NOTE — Assessment & Plan Note (Signed)
Asymptomatic, currently on observation and Q 42M CT surveillance, most recently completed 07/2022 with no changes noted.  Next scan and labs 01/2023 per Dr. Shirline Frees.

## 2022-08-20 NOTE — Assessment & Plan Note (Addendum)
3 L oxygen requirement, significant DOE, not in exacerbation.  Last pulmonary visit 10/2021 -sample of Breztri provided at that time; unable to afford prescription ($600). 29-month follow-up intended but not completed.  She has not had any lung medication since that time.  We have absolutely no inhaler samples in our sample cabinet.  I have sent a  message to NP Kandice Robinsons at [facility] for recommendations.

## 2022-09-14 ENCOUNTER — Telehealth: Payer: Self-pay

## 2022-09-14 NOTE — Telephone Encounter (Signed)
Called patient.  Unable to leave message on home VM as it ends the call before a voice mail can be given.  Called work number.  Spoke with patients daughter, Douglass Rivers (on [place]).  She will get a message to her mother to call for an appointment with Kandice Robinsons, NP.  Daughter states will get patient assistance information for Breztri at the OV.  Also requesting we send patient a MyChart message regarding follow up visit.  Sent message via MyChart.

## 2022-09-14 NOTE — Telephone Encounter (Signed)
-----   Message from Bevelyn Ngo, NP sent at 09/08/2022  2:59 PM EDT ----- Regarding: RE: med and f/u needed We can see if there is a financial aid option for the [facility] for this patient . They have a good plan for patient's who have issues with cost.  LB Triage, can we get this patient scheduled with myself or another provider, and make sure we give her the financial aid paperwork for [facility]. Can we call her an offer some Breztri samples until she can get seen? Thanks ----- Message ----- From: Miguel Aschoff, MD Sent: 08/17/2022  10:16 AM EDT To: Bevelyn Ngo, NP Subject: med and f/u needed                             Ezequiel Ganser meeting Ms. Struss today as her PCP for first time and she shared that she didn't realize she was supposed to f/u with you about the [place] or scheduling a 1M appointment.  She has dyspnea with exertion (I don't know how different it is from baseline).  She cannot afford the [facility] prescription (or whatever it was; 600). We are out of all inhaled med samples.  She has distant breath sounds and is breathing comfortably on 3L.  Let me know how you'd like to coordinate and I'll help in anyway I can.  Thank you! Raynelle Fanning

## 2022-11-03 IMAGING — CT CT ANGIO CHEST
2 of 7 series · 13 of 36 positions shown · IV contrast (omnipaque)
Comparison: None.

CLINICAL DATA: Low O2 saturation

EXAM:
CT ANGIOGRAPHY CHEST WITH CONTRAST
TECHNIQUE: Multidetector CT imaging of the chest was performed using the
standard protocol during bolus administration of intravenous
contrast. Multiplanar CT image reconstructions and MIPs were
obtained to evaluate the vascular anatomy.
CONTRAST:  75mL OMNIPAQUE IOHEXOL 350 MG/ML SOLN

[Series 5: pe axial thins · axial · 0.61mm/px · z∈[+1365,+1629]mm · 12 of 312 slices shown]
[im 24/312  lung]
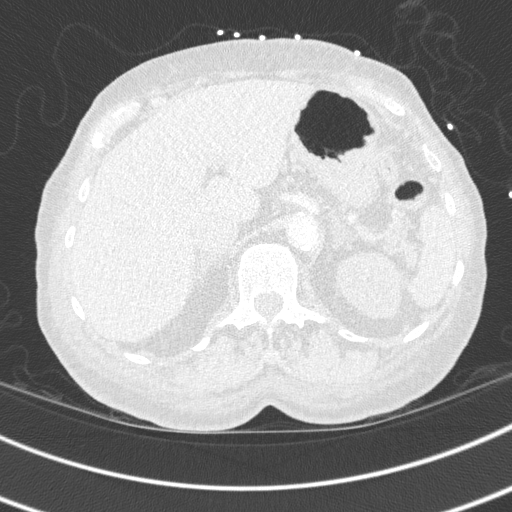
[im 48/312  mediastinal]
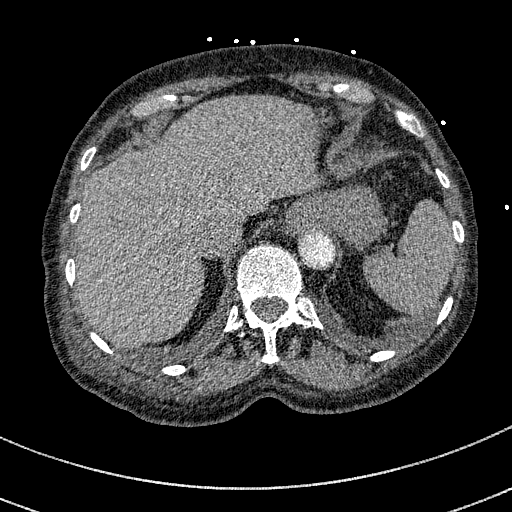
[im 72/312  lung]
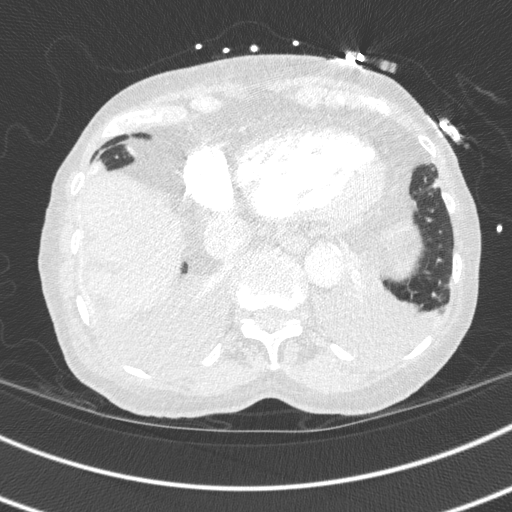
[im 96/312  mediastinal]
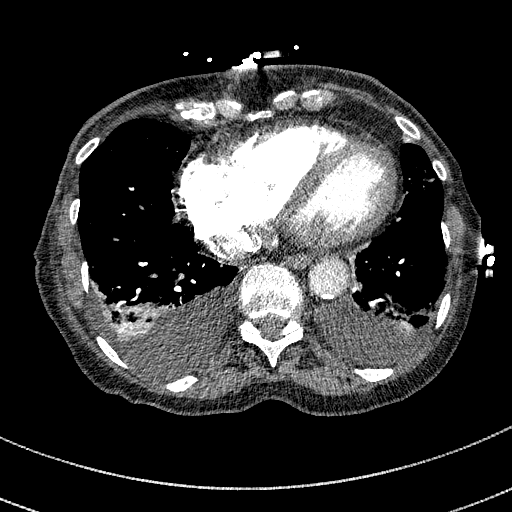
[im 120/312  lung]
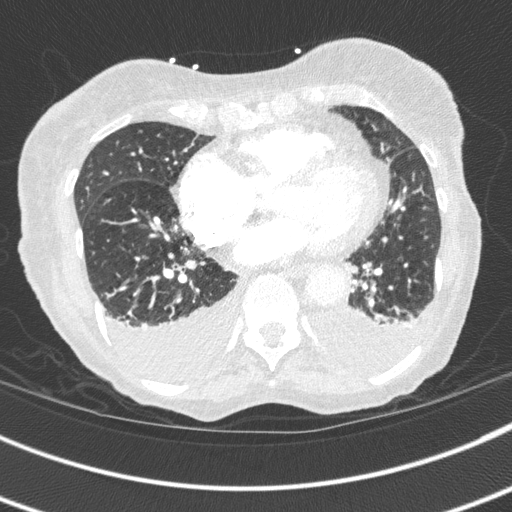
[im 144/312  mediastinal]
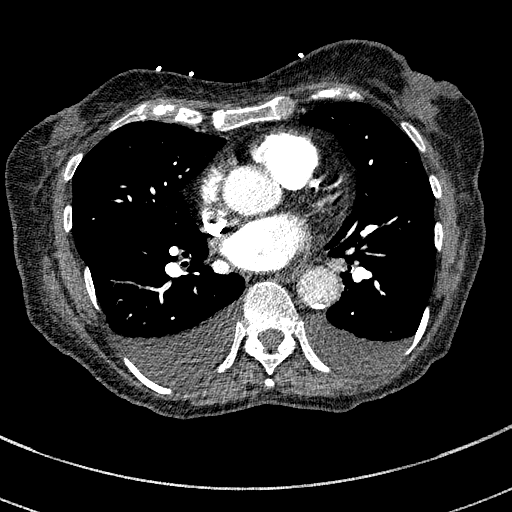
[im 168/312  lung]
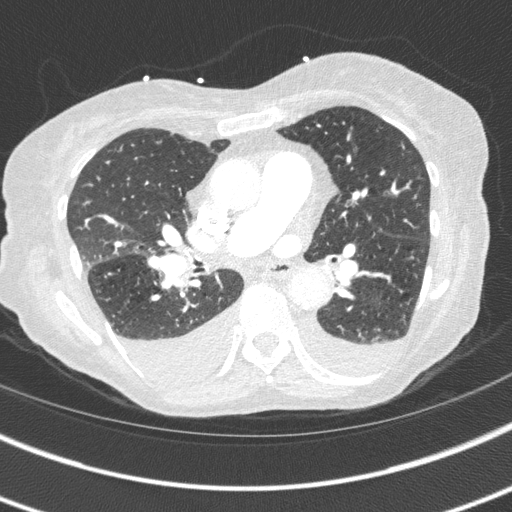
[im 192/312  mediastinal]
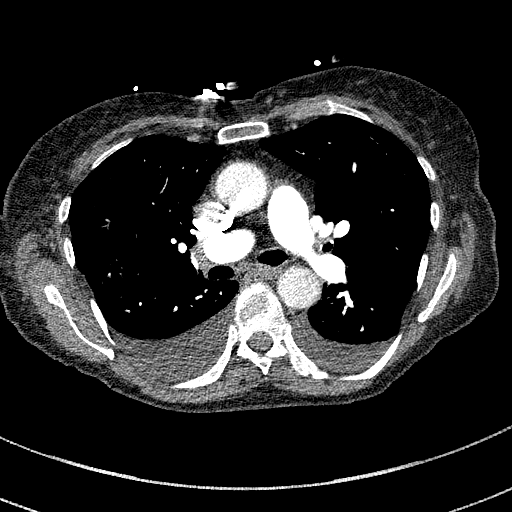
[im 216/312  lung]
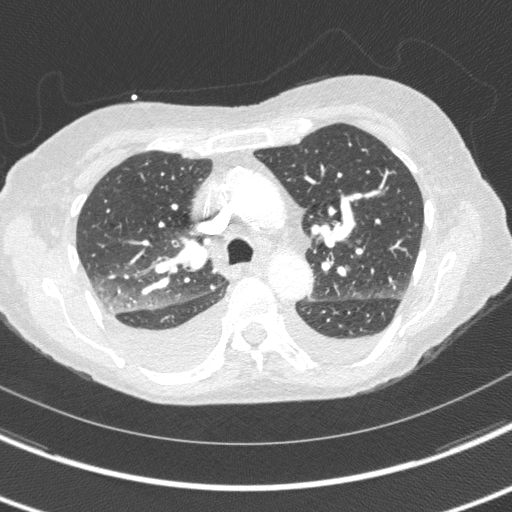
[im 240/312  mediastinal]
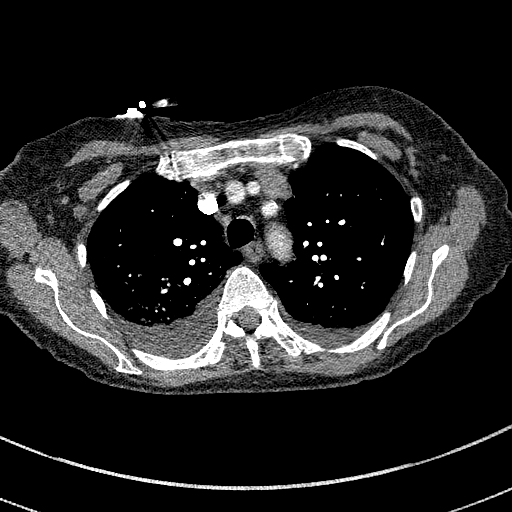
[im 264/312  lung]
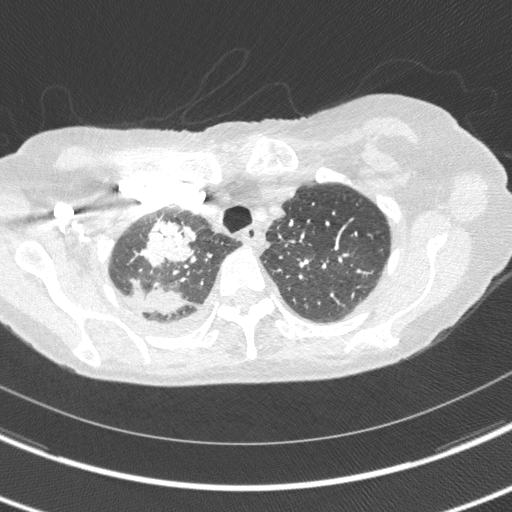
[im 288/312  mediastinal]
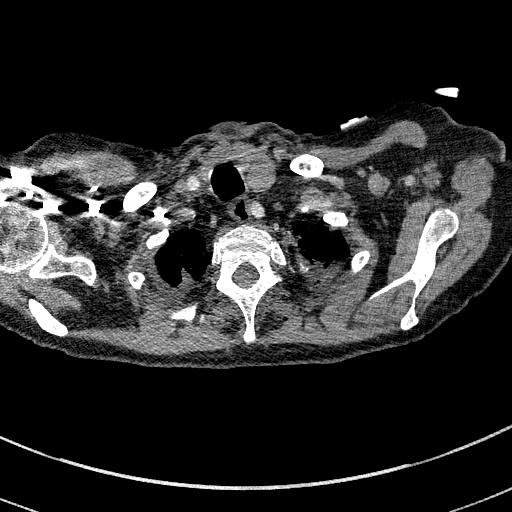

[Series 7: cor soft · coronal · 0.61mm/px · 1 of 121 slices shown]
[im 61/121  mediastinal]
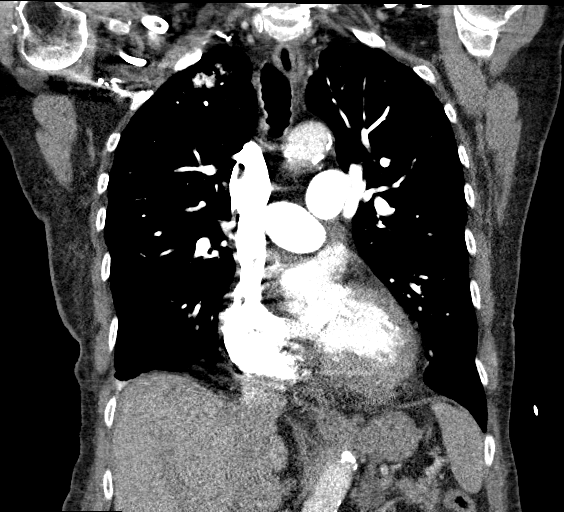

[13 of 36 positions shown; findings below may reference images not displayed]

FINDINGS: Cardiovascular: A CT contrast opacification of the pulmonary
arteries. No evidence of pulmonary embolus. Cardiomegaly. No
pericardial effusion. Atherosclerotic disease of the thoracic aorta
calcifications of the LAD.

Mediastinum/Nodes: No pathologically enlarged lymph nodes seen in
the chest. Esophagus and thyroid are unremarkable.

Lungs/Pleura: Central airways are patent. Mild upper lobe
predominant centrilobular emphysema. Bilateral bronchial wall
thickening with areas of mucous plugging. Small
right-greater-than-left pleural effusions and atelectasis. Masslike
nodular opacity of the right upper lobe measuring 3.9 x 2.6 cm
additional nodular opacity is seen posteriorly in the right upper
lobe measuring 2.8 x 1.9 cm. Additional nodular opacity of the left
lower lobe measuring 1.6 x 1.5 cm on series 5, image 158.

Upper Abdomen: No acute abnormality.

Musculoskeletal: No chest wall abnormality. No acute or significant
osseous findings.

Review of the MIP images confirms the above findings.
IMPRESSION: No evidence of pulmonary embolus.

Right upper lobe masslike opacity. Additional large nodular
opacities are seen in the right upper lobe and left lower lobe.
Differential includes multifocal infectious process (including
fungal etiologies) versus primary lung neoplasm. A 1 month follow CT
should be performed following therapy.

Small bilateral pleural effusions and atelectasis.

Aortic Atherosclerosis (CKJZA-3E0.0) and Emphysema (CKJZA-2IX.7).

I personally called these results to Dr. Poblete in the emergency

## 2022-11-04 IMAGING — DX DG CHEST 1V
1 series · 1 of 1 positions shown · non-contrast
Comparison: Chest x-ray 12/18/2020, CT chest 12/18/2020

CLINICAL DATA: Post thoracentesis

EXAM:
CHEST  1 VIEW

[chest ap]
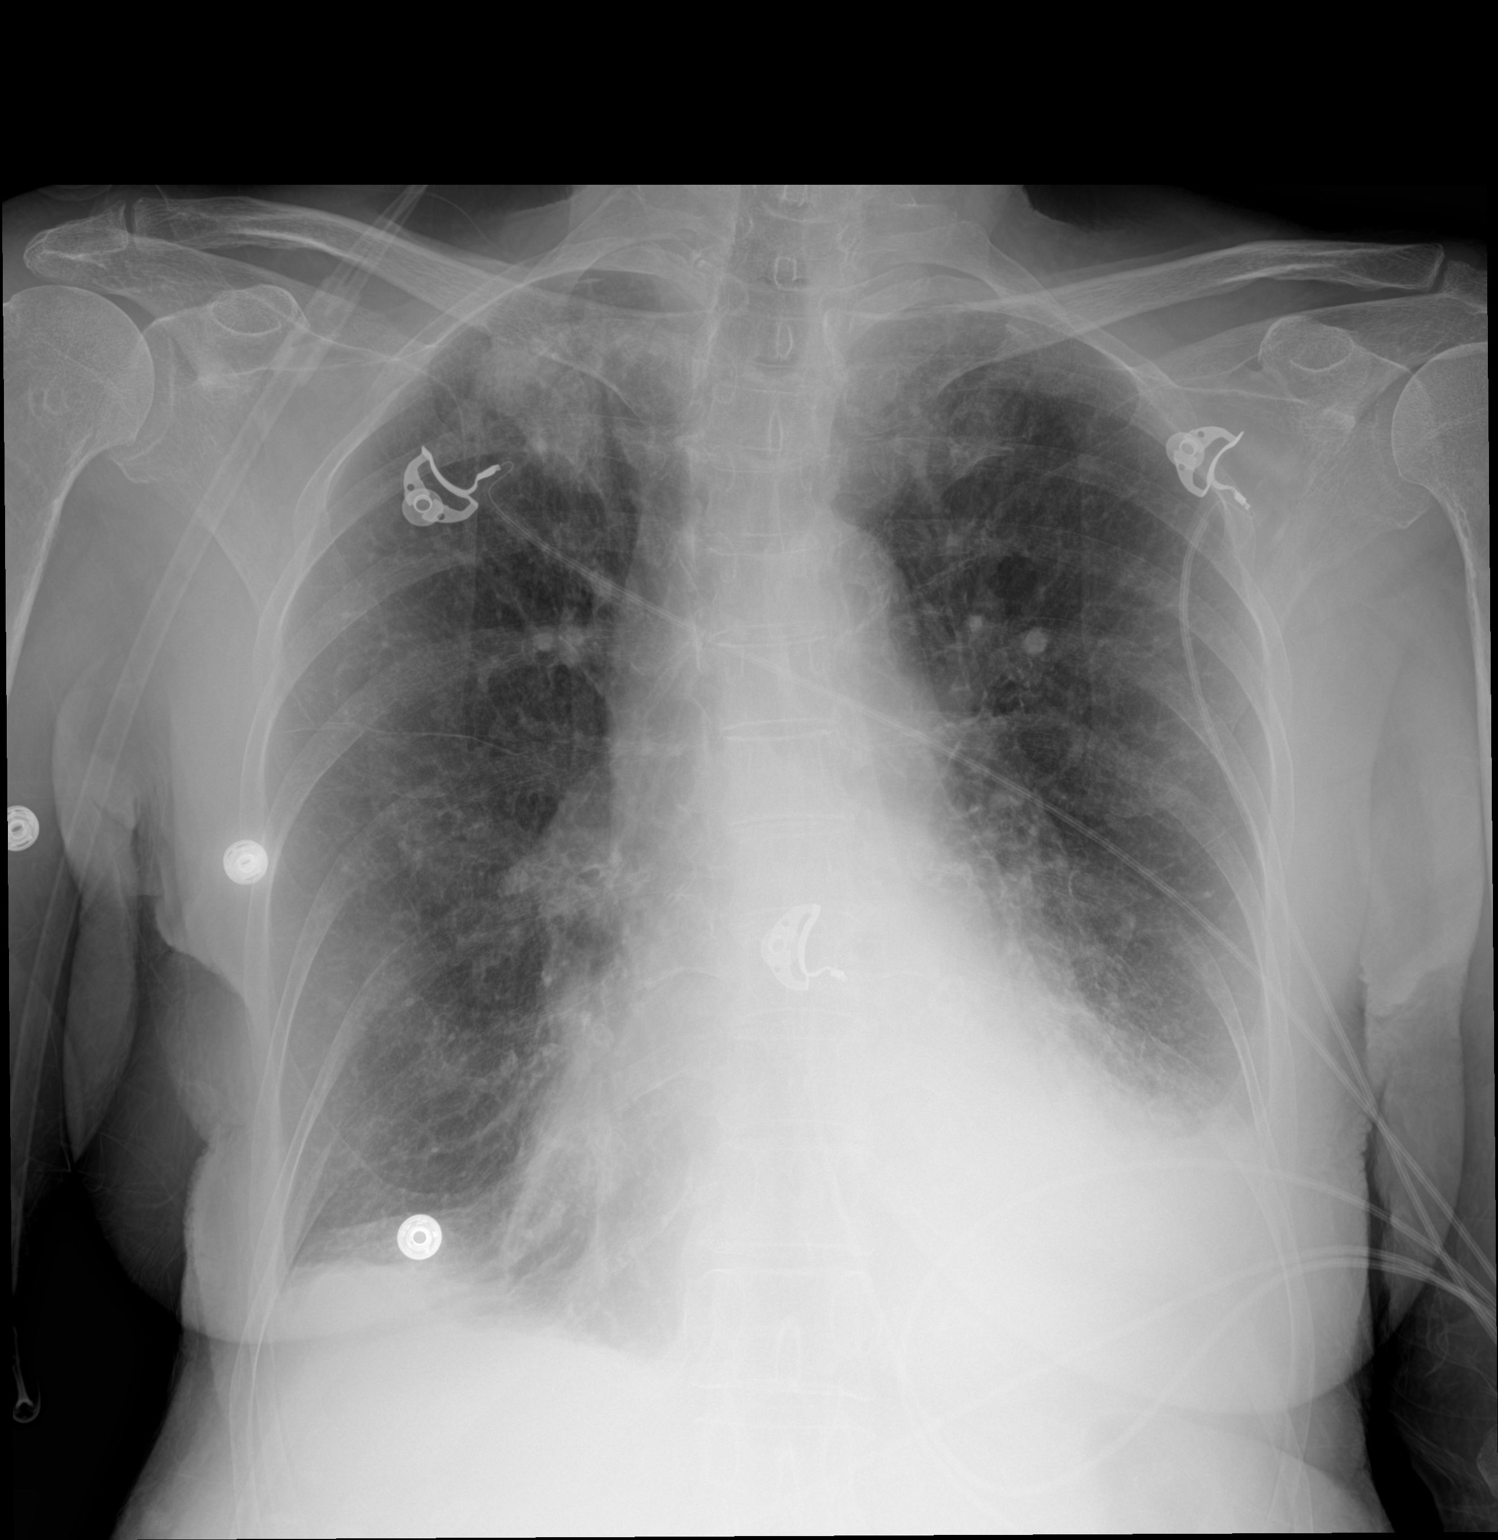

[1 of 1 positions shown; findings below may reference images not displayed]

FINDINGS: Decreased right pleural effusion post thoracentesis. No definitive
pneumothorax seen. Right apical masslike opacity. Small left-sided
pleural effusion with basilar airspace disease. Stable cardiomegaly
with aortic atherosclerosis.
IMPRESSION: 1. Decreased right pleural effusion post thoracentesis without
visible pneumothorax. Small residual with atelectasis right base
2. Small left-sided pleural effusion and left basilar airspace
disease
3. Cardiomegaly
4. Right apical masslike opacity as before

## 2022-11-27 NOTE — Progress Notes (Signed)
Monitoring, asymptomatic.  No action at this time.

## 2023-01-11 ENCOUNTER — Ambulatory Visit (HOSPITAL_COMMUNITY)
Admission: RE | Admit: 2023-01-11 | Discharge: 2023-01-11 | Disposition: A | Discharge status: Home / Self Care | Payer: Medicare Other | Source: Ambulatory Visit | Attending: Internal Medicine | Admitting: Internal Medicine

## 2023-01-11 DIAGNOSIS — C3490 Malignant neoplasm of unspecified part of unspecified bronchus or lung: Secondary | ICD-10-CM | POA: Diagnosis present

## 2023-01-11 MED ORDER — IOHEXOL 300 MG/ML  SOLN
75.0000 mL | Freq: Once | INTRAMUSCULAR | Status: AC | PRN
  Administered 2023-01-11: 75 mL via INTRAVENOUS

## 2023-01-18 ENCOUNTER — Inpatient Hospital Stay (HOSPITAL_BASED_OUTPATIENT_CLINIC_OR_DEPARTMENT_OTHER): Payer: Medicare Other | Admitting: Internal Medicine

## 2023-01-18 ENCOUNTER — Inpatient Hospital Stay: Payer: Medicare Other | Attending: Internal Medicine

## 2023-01-18 VITALS — BP 139/62 | HR 86 | Temp 98.2°F | Resp 16 | Ht 64.0 in | Wt 139.0 lb

## 2023-01-18 DIAGNOSIS — C3490 Malignant neoplasm of unspecified part of unspecified bronchus or lung: Secondary | ICD-10-CM

## 2023-01-18 DIAGNOSIS — R0602 Shortness of breath: Secondary | ICD-10-CM | POA: Diagnosis not present

## 2023-01-18 DIAGNOSIS — C3411 Malignant neoplasm of upper lobe, right bronchus or lung: Secondary | ICD-10-CM | POA: Diagnosis present

## 2023-01-18 DIAGNOSIS — Z9981 Dependence on supplemental oxygen: Secondary | ICD-10-CM | POA: Diagnosis not present

## 2023-01-18 DIAGNOSIS — R911 Solitary pulmonary nodule: Secondary | ICD-10-CM | POA: Insufficient documentation

## 2023-01-18 LAB — CBC WITH DIFFERENTIAL (CANCER CENTER ONLY)
Abs Immature Granulocytes: 0.02 10*3/uL (ref 0.00–0.07)
Basophils Absolute: 0 10*3/uL (ref 0.0–0.1)
Basophils Relative: 0 %
Eosinophils Absolute: 0.2 10*3/uL (ref 0.0–0.5)
Eosinophils Relative: 2 %
HCT: 36.9 % (ref 36.0–46.0)
Hemoglobin: 11.8 g/dL — ABNORMAL LOW (ref 12.0–15.0)
Immature Granulocytes: 0 %
Lymphocytes Relative: 28 %
Lymphs Abs: 2 10*3/uL (ref 0.7–4.0)
MCH: 33.1 pg (ref 26.0–34.0)
MCHC: 32 g/dL (ref 30.0–36.0)
MCV: 103.7 fL — ABNORMAL HIGH (ref 80.0–100.0)
Monocytes Absolute: 0.6 10*3/uL (ref 0.1–1.0)
Monocytes Relative: 8 %
Neutro Abs: 4.4 10*3/uL (ref 1.7–7.7)
Neutrophils Relative %: 62 %
Platelet Count: 194 10*3/uL (ref 150–400)
RBC: 3.56 MIL/uL — ABNORMAL LOW (ref 3.87–5.11)
RDW: 13.1 % (ref 11.5–15.5)
WBC Count: 7.2 10*3/uL (ref 4.0–10.5)
nRBC: 0 % (ref 0.0–0.2)

## 2023-01-18 LAB — CMP (CANCER CENTER ONLY)
ALT: 28 U/L (ref 0–44)
AST: 32 U/L (ref 15–41)
Albumin: 4.1 g/dL (ref 3.5–5.0)
Alkaline Phosphatase: 96 U/L (ref 38–126)
Anion gap: 5 (ref 5–15)
BUN: 13 mg/dL (ref 8–23)
CO2: 38 mmol/L — ABNORMAL HIGH (ref 22–32)
Calcium: 9.5 mg/dL (ref 8.9–10.3)
Chloride: 100 mmol/L (ref 98–111)
Creatinine: 0.72 mg/dL (ref 0.44–1.00)
GFR, Estimated: >60 mL/min (ref >60)
Glucose, Bld: 140 mg/dL — ABNORMAL HIGH (ref 70–99)
Potassium: 4.8 mmol/L (ref 3.5–5.1)
Sodium: 143 mmol/L (ref 135–145)
Total Bilirubin: 0.4 mg/dL (ref 0.3–1.2)
Total Protein: 7.1 g/dL (ref 6.5–8.1)

## 2023-01-18 NOTE — Progress Notes (Signed)
[facility] [phone]   [phone]  OFFICE PROGRESS NOTE  Miguel Aschoff, MD [address] [place] [address]  DIAGNOSIS:  Stage IV (T2b, N0, M1 a) non-small cell lung cancer with unclear subtype on the pathology report diagnosed in November 2022 and presented with right upper lobe lung mass as well as right upper lobe nodule in addition to right lower lobe nodule.  DETECTED ALTERATION(S) / BIOMARKER(S) % CFDNA OR AMPLIFICATION ASSOCIATED FDA-APPROVED THERAPIES CLINICAL TRIAL AVAILABILITY [id number] 0.1% None No  PD-L1 expression 1%  PRIOR THERAPY: SBRT to the left and right lung nodules under the care of Dr. Roselind Messier completed on June 24, 2021.  CURRENT THERAPY: Observation  INTERVAL HISTORY: Amy Sandoval 88 y.o. female returns to the clinic today for follow-up visit accompanied by her daughter Amy Sandoval.  The patient is feeling fine today with no concerning complaints except for the baseline shortness of breath and she is currently on home oxygen.  She requested the form to [employer] to give her priority of connecting power in case there is power interruption.  She denied having any current chest pain, cough or hemoptysis.  She has no nausea, vomiting, diarrhea or constipation.  She has no headache or visual changes.  She denied having any fever or chills.  She had repeat CT scan of the chest performed recently and she is here for evaluation and discussion of her scan results.  MEDICAL HISTORY: Past Medical History:  Diagnosis Date   COPD (chronic obstructive pulmonary disease) (HCC)    History of radiation therapy    RUL lung and LLL lung 06/15/2021-06/24/2021  Dr Antony Blackbird   Hx of cervical spine fracture 2/2 fall managed with neck brace 08/20/2022   Hx of herpes zoster infection of R thoracic region; no neuropathic pain    Non-small cell lung cancer (NSCLC) (HCC) 12/2020   Pneumonia    Sepsis (HCC) 12/18/2020     ALLERGIES:  is allergic to penicillins.  MEDICATIONS:  Current Outpatient Medications  Medication Sig Dispense Refill   Budeson-Glycopyrrol-Formoterol (BREZTRI AEROSPHERE) 160-9-4.8 MCG/ACT AERO Inhale 2 puffs into the lungs in the morning and at bedtime. 10.7 g 0   naproxen sodium (ALEVE) 220 MG tablet Take 220 mg by mouth as needed.     OXYGEN Inhale 3 L into the lungs as needed. When 02 sats drop below 92     No current facility-administered medications for this visit.    SURGICAL HISTORY:  Past Surgical History:  Procedure Laterality Date   BRONCHIAL BIOPSY  03/09/2021   Procedure: BRONCHIAL BIOPSIES;  Surgeon: Leslye Peer, MD;  Location: [facility] ENDOSCOPY;  Service: Pulmonary;;   BRONCHIAL BRUSHINGS  03/09/2021   Procedure: BRONCHIAL BRUSHINGS;  Surgeon: Leslye Peer, MD;  Location[facility] ENDOSCOPY;  Service: Pulmonary;;   BRONCHIAL NEEDLE ASPIRATION BIOPSY  03/09/2021   Procedure: BRONCHIAL NEEDLE ASPIRATION BIOPSIES;  Surgeon: Leslye Peer, MD;  Location: [facility] ENDOSCOPY;  Service: Pulmonary;;   DILATION AND CURETTAGE OF UTERUS     EYE SURGERY     bilateral cataract removal   TONSILLECTOMY     VIDEO BRONCHOSCOPY WITH ENDOBRONCHIAL NAVIGATION Bilateral 03/09/2021   Procedure: VIDEO BRONCHOSCOPY WITH ENDOBRONCHIAL NAVIGATION;  Surgeon: Leslye Peer, MD;  Location: [facility] ENDOSCOPY;  Service: Pulmonary;  Laterality: Bilateral;  ION w/ possible fiducial    REVIEW OF SYSTEMS:  Constitutional: positive for fatigue Eyes: negative Ears, nose, mouth, throat, and face: negative Respiratory: positive for dyspnea  on exertion Cardiovascular: negative Gastrointestinal: negative Genitourinary:negative Integument/breast: negative Hematologic/lymphatic: negative Musculoskeletal:negative Neurological: negative Behavioral/Psych: negative Endocrine: negative Allergic/Immunologic: negative   PHYSICAL EXAMINATION: General appearance: alert, cooperative, fatigued, and no distress Head:  Normocephalic, without obvious abnormality, atraumatic Neck: no adenopathy, no JVD, supple, symmetrical, trachea midline, and thyroid not enlarged, symmetric, no tenderness/mass/nodules Lymph nodes: Cervical, supraclavicular, and axillary nodes normal. Resp: clear to auscultation bilaterally Back: symmetric, no curvature. ROM normal. No CVA tenderness. Cardio: regular rate and rhythm, S1, S2 normal, no murmur, click, rub or gallop GI: soft, non-tender; bowel sounds normal; no masses,  no organomegaly Extremities: extremities normal, atraumatic, no cyanosis or edema Neurologic: Alert and oriented X 3, normal strength and tone. Normal symmetric reflexes. Normal coordination and gait  ECOG PERFORMANCE STATUS: 1 - Symptomatic but completely ambulatory  Blood pressure 139/62, pulse 86, temperature 98.2 F (36.8 C), resp. rate 16, height 5\' 4"  (1.626 m), weight 139 lb (63 kg), SpO2 93%.  LABORATORY DATA: Lab Results  Component Value Date   WBC 7.2 01/18/2023   HGB 11.8 (L) 01/18/2023   HCT 36.9 01/18/2023   MCV 103.7 (H) 01/18/2023   PLT 194 01/18/2023      Chemistry      Component Value Date/Time   NA 139 07/12/2022 1309   K 4.4 07/12/2022 1309   CL 100 07/12/2022 1309   CO2 36 (H) 07/12/2022 1309   BUN 16 07/12/2022 1309   CREATININE 0.57 07/12/2022 1309      Component Value Date/Time   CALCIUM 9.1 07/12/2022 1309   ALKPHOS 114 07/12/2022 1309   AST 42 (H) 07/12/2022 1309   ALT 44 07/12/2022 1309   BILITOT 0.6 07/12/2022 1309       RADIOGRAPHIC STUDIES: CT Chest W Contrast  Result Date: 01/18/2023 CLINICAL DATA:  Staging of non-small-cell lung cancer. * Tracking Code: BO * EXAM: CT CHEST WITH CONTRAST TECHNIQUE: Multidetector CT imaging of the chest was performed during intravenous contrast administration. RADIATION DOSE REDUCTION: This exam was performed according to the departmental dose-optimization program which includes automated exposure control, adjustment of the mA  and/or kV according to patient size and/or use of iterative reconstruction technique. CONTRAST:  75mL OMNIPAQUE IOHEXOL 300 MG/ML  SOLN COMPARISON:  07/08/2022 FINDINGS: Cardiovascular: Advanced aortic and branch vessel atherosclerosis. Normal heart size, without pericardial effusion. Lad coronary artery calcification. No central pulmonary embolism, on this non-dedicated study. Mediastinum/Nodes: No supraclavicular adenopathy. No hilar adenopathy. Precarinal node measures 6 mm on 59/2 versus 5 mm on the prior. A subcarinal node measures 12 mm on 69/2 versus 5 mm on the prior. Lungs/Pleura: Trace left pleural fluid is new. Moderate centrilobular emphysema. Right apical irregular nodule measures 1.3 x 1.1 cm on 22/6 versus 2.0 x 1.5 cm on the prior. The more posterior right upper lobe treated nodule with adjacent architectural distortion and and volume loss measures 1.4 x 1.2 cm on 27/6 and is similar. Partially cavitary irregular lateral right lower lobe pulmonary nodule measures 11 x 7 mm on 116/6 and has enlarged from 7 x 5 mm on the prior exam. Left lower lobe solid nodule with presumably radiation induced surrounding ground-glass and septal thickening. This measures 13 x 11 mm on 85/6 versus 19 x 12 mm on the prior. Upper Abdomen: Subtle irregular hepatic capsule. Normal imaged portions of the spleen, stomach, gallbladder, right adrenal gland, right kidney. Mild left adrenal nodularity is unchanged. An upper pole left renal 1.3 cm low-density lesion is consistent with a cyst . In the absence of  clinically indicated signs/symptoms require(s) no independent follow-up. 3 mm hypoattenuating pancreatic neck lesion on 164/2 is of doubtful clinical significance given patient age and comorbidities. Musculoskeletal: Osteopenia. Healing of posterolateral left eighth rib fracture, likely due to insufficiency in the setting of adjacent radiation. 4th posterolateral right rib displaced fracture is new on 32/2 and may also be  due to insufficiency in the setting of adjacent radiation. IMPRESSION: 1. Decreased size of left lower and right apical pulmonary nodules. 2. Enlarging right lower lobe pulmonary nodule, highly suspicious for metachronous primary or metastatic disease. 3. Developing thoracic adenopathy, highly suspicious for nodal metastasis. 4. Similar right upper lobe pulmonary nodule. 5. Interval right fourth rib fracture. Healing of eighth left rib fracture. These are likely due to insufficiency from radiation necrosis. 6. Subtle irregular hepatic capsule. Correlate with risk factors for cirrhosis. 7. Incidental findings, including: Aortic atherosclerosis (ICD10-I70.0), coronary artery atherosclerosis and emphysema (ICD10-J43.9). Electronically Signed   By: Jeronimo Greaves M.D.   On: 01/18/2023 09:20    ASSESSMENT AND PLAN: This is a very pleasant 88 years old white female with a stage IV (T2b, N0, M1 a) non-small cell lung cancer diagnosed in November 2022 and presented with right upper lobe lung mass and adjacent nodule in addition to synchronous primary versus oligometastatic nodule in the left lower lobe diagnosed and November 2022. Molecular studies showed no actionable mutations and PD-L1 expression of 1%. The patient status post SBRT to the left and right lung nodule under the care of Dr. Roselind Messier. The patient has been in observation and she was feeling fine except for the baseline shortness of breath. She had repeat CT scan of the chest performed recently.  The scan showed decrease in the size of the left lower lobe and right apical pulmonary nodules but there was enlargement of right lower lobe pulmonary nodule highly suspicious for metachronous primary or metastatic disease.  There was also developing thoracic adenopathy highly suspicious for nodal metastasis. I personally and independently reviewed the scan images and discussed the result with the patient and her daughter. I recommended for her to have a PET scan  for further evaluation of her disease and if concerning for disease recurrence or metastasis, I will discuss with her other treatment options including systemic therapy. The patient will come back for follow-up visit in 2-3 weeks for evaluation and discussion of the PET scan results. She was advised to call immediately if she has any other concerning symptoms in the interval. The patient voices understanding of current disease status and treatment options and is in agreement with the current care plan.  All questions were answered. The patient knows to call the clinic with any problems, questions or concerns. We can certainly see the patient much sooner if necessary.   Disclaimer: This note was dictated with voice recognition software. Similar sounding words can inadvertently be transcribed and may not be corrected upon review.

## 2023-01-19 ENCOUNTER — Telehealth: Payer: Self-pay | Admitting: Internal Medicine

## 2023-01-19 ENCOUNTER — Telehealth: Payer: Self-pay | Admitting: Medical Oncology

## 2023-01-19 NOTE — Telephone Encounter (Signed)
Scheduled per 09/17 los, patient has been called and notified.

## 2023-01-19 NOTE — Telephone Encounter (Signed)
[employer] form faxed .

## 2023-01-27 ENCOUNTER — Ambulatory Visit (HOSPITAL_COMMUNITY)
Admission: RE | Admit: 2023-01-27 | Discharge: 2023-01-27 | Disposition: A | Discharge status: Home / Self Care | Payer: Medicare Other | Source: Ambulatory Visit | Attending: Internal Medicine | Admitting: Internal Medicine

## 2023-01-27 DIAGNOSIS — C3490 Malignant neoplasm of unspecified part of unspecified bronchus or lung: Secondary | ICD-10-CM | POA: Insufficient documentation

## 2023-01-27 LAB — GLUCOSE, CAPILLARY: Glucose-Capillary: 106 mg/dL — ABNORMAL HIGH (ref 70–99)

## 2023-01-27 MED ORDER — FLUDEOXYGLUCOSE F - 18 (FDG) INJECTION
7.0000 | Freq: Once | INTRAVENOUS | Status: AC | PRN
  Administered 2023-01-27: 6.9 via INTRAVENOUS

## 2023-01-31 IMAGING — MR MR HEAD WO/W CM
13 series · 48 of 48 positions shown · IV contrast (6ml GADAVIST)
Comparison: None.

CLINICAL DATA: Non-small cell lung cancer, staging

EXAM:
MRI HEAD WITHOUT AND WITH CONTRAST
TECHNIQUE: Multiplanar, multiecho pulse sequences of the brain and surrounding
structures were obtained without and with intravenous contrast.
CONTRAST:  6mL GADAVIST GADOBUTROL 1 MMOL/ML IV SOLN

[Series 5: DWI · axial · 3.0mm · 1.36mm/px · z∈[-68,+83]mm · 6 of 104 slices shown (1 of 2)]
[im 1/104]
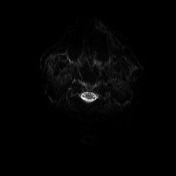
[im 21/104]
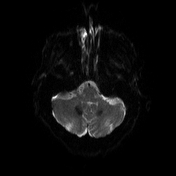
[im 42/104]
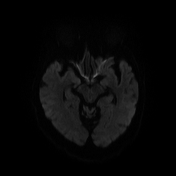
[im 62/104]
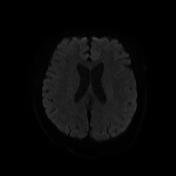
[im 83/104]
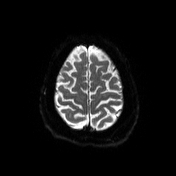
[im 104/104]
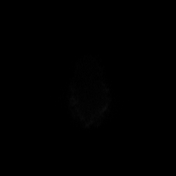

[Series 6: DWI · axial · 3.0mm · 1.36mm/px · z∈[-68,+83]mm · 3 of 52 slices shown (2 of 2)]
[im 1/52]
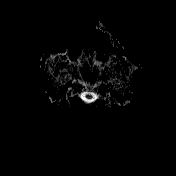
[im 26/52]
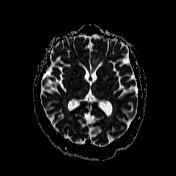
[im 52/52]
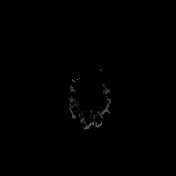

[Series 7: T1 · sagittal · 5.0mm · 0.75mm/px · 1 of 24 slices shown (1 of 2)]
[im 1/24]
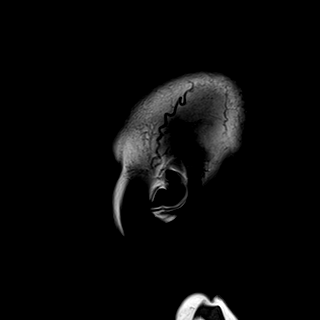

[Series 8: T2 · axial · 5.0mm · 0.62mm/px · z∈[-74,+87]mm · 2 of 26 slices shown]
[im 1/26]
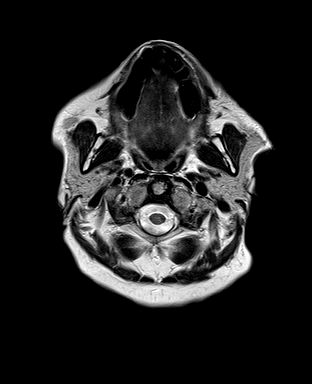
[im 26/26]
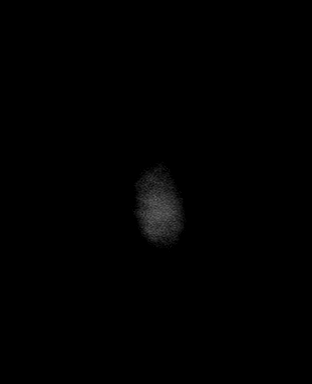

[Series 9: swi_images · axial · 3.0mm · 0.75mm/px · z∈[-70,+82]mm · 3 of 52 slices shown]
[im 1/52]
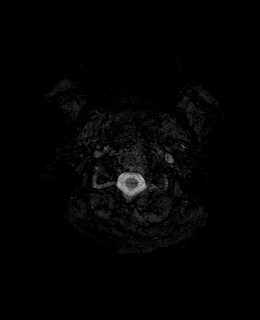
[im 26/52]
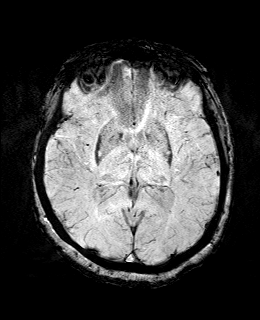
[im 52/52]
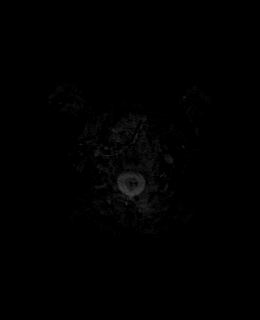

[Series 11: FLAIR · axial · 3.0mm · 0.75mm/px · z∈[-70,+82]mm · 3 of 52 slices shown]
[im 1/52]
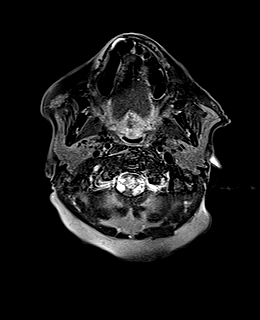
[im 26/52]
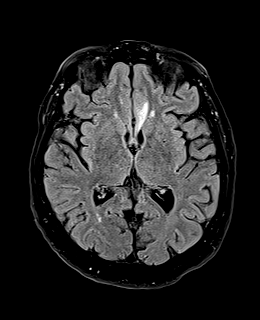
[im 52/52]
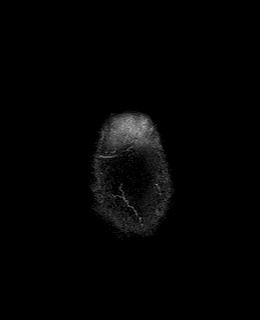

[Series 12: T1 · axial · 1.0mm · 0.94mm/px · z∈[-73,+85]mm · 10 of 160 slices shown (2 of 2)]
[im 1/160]
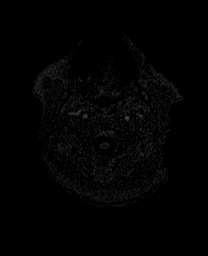
[im 18/160]
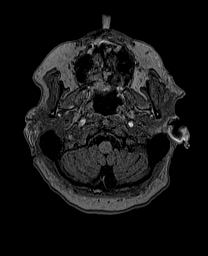
[im 36/160]
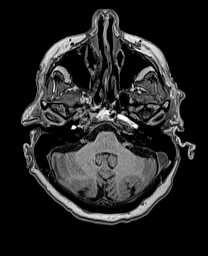
[im 54/160]
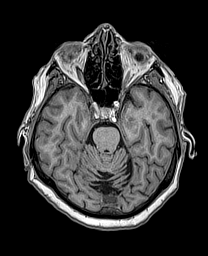
[im 71/160]
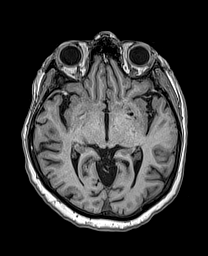
[im 89/160]
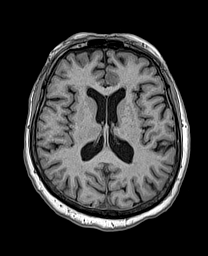
[im 107/160]
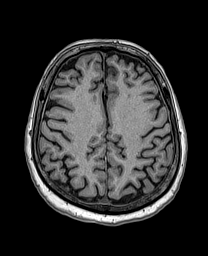
[im 124/160]
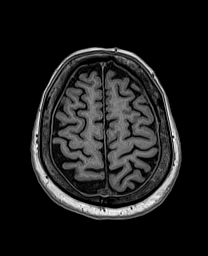
[im 142/160]
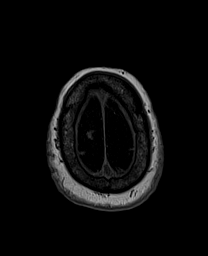
[im 160/160]
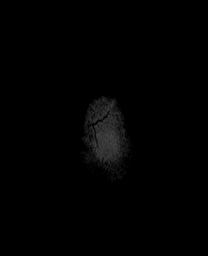

[Series 13: cor dwi_tracew · coronal · 5.0mm · 1.53mm/px · 3 of 50 slices shown]
[im 1/50]
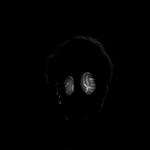
[im 25/50]
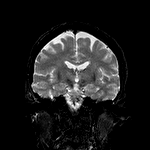
[im 50/50]
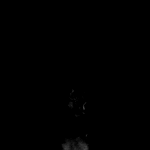

[Series 14: cor dwi_adc · coronal · 5.0mm · 1.53mm/px · 2 of 25 slices shown]
[im 1/25]
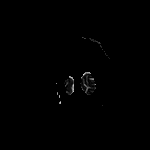
[im 25/25]
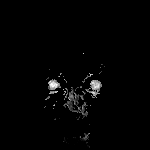

[Series 15: T2 post-contrast · coronal · 5.0mm · 0.57mm/px · 2 of 25 slices shown]
[im 1/25]
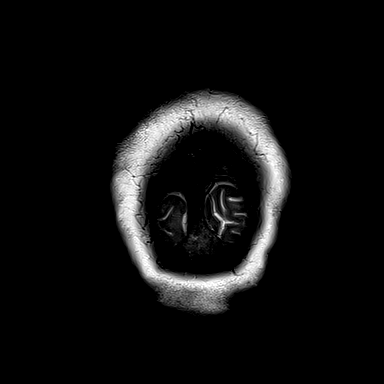
[im 25/25]
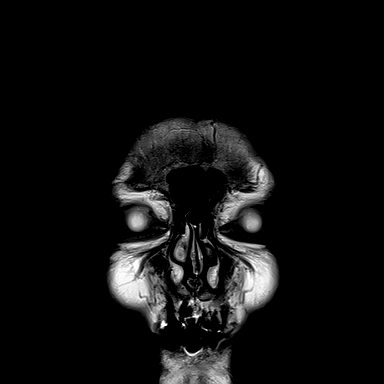

[Series 16: T1 post-contrast · axial · 1.0mm · 0.94mm/px · z∈[-73,+85]mm · 10 of 160 slices shown (1 of 3)]
[im 1/160]
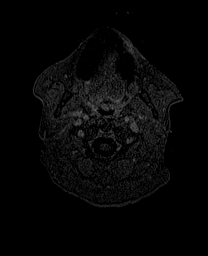
[im 18/160]
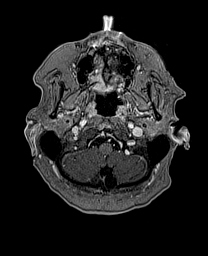
[im 36/160]
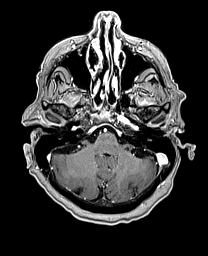
[im 54/160]
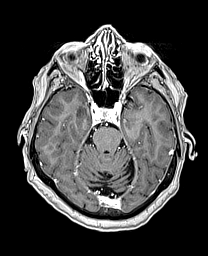
[im 71/160]
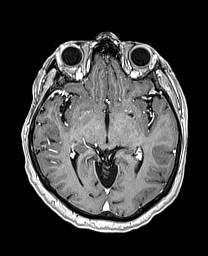
[im 89/160]
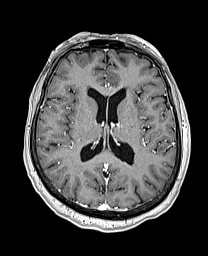
[im 107/160]
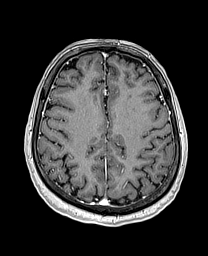
[im 124/160]
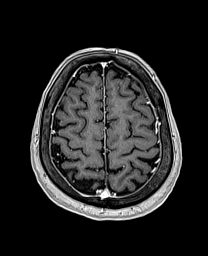
[im 142/160]
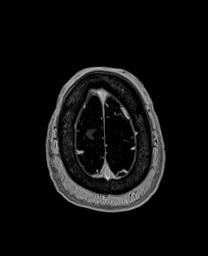
[im 160/160]
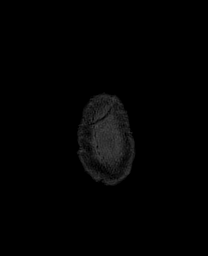

[Series 17: T1 post-contrast · coronal · 5.0mm · 0.43mm/px · 2 of 25 slices shown (2 of 3)]
[im 1/25]
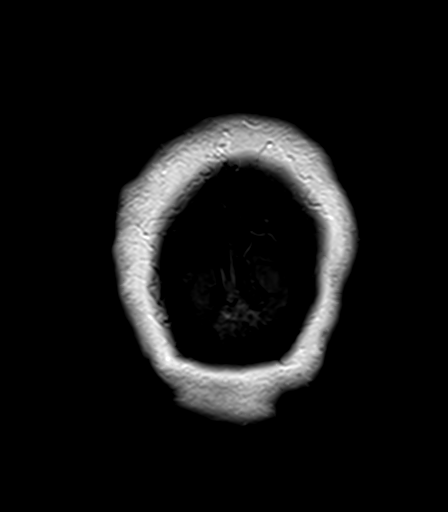
[im 25/25]
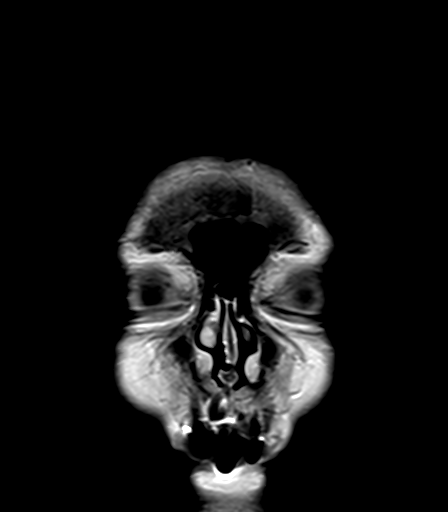

[Series 18: T1 post-contrast · sagittal · 5.0mm · 0.75mm/px · 1 of 24 slices shown (3 of 3)]
[im 1/24]
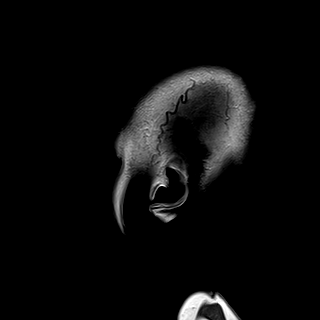

[48 of 48 positions shown; findings below may reference images not displayed]

FINDINGS: Brain: No enhancing lesions identified.

There is an ill-defined hyperintensity in the left medial frontal
cortex on FLAIR which did not show abnormal enhancement. Few tiny
deep white matter hyperintensities bilaterally do not enhance.

Negative for acute infarct or hemorrhage.  Ventricle size normal

Vascular: Normal arterial flow voids at the skull base.

Skull and upper cervical spine: No skeletal lesion identified.

Sinuses/Orbits: Paranasal sinuses clear. Bilateral cataract
extraction

Other: None
IMPRESSION: 1. No enhancing metastatic deposits identified
2. Ill-defined hyperintensity in the left medial frontal cortex
measuring approximately 15 mm without enhancement. This could
represent a chronic infarct however there is not significant
encephalomalacia. Given lack of enhancement, metastatic disease not
felt likely however the FLAIR appearance could be seen with
metastatic disease. MRI follow-up recommended in 4 weeks for
interval change.

## 2023-02-02 NOTE — Progress Notes (Signed)
[facility] OFFICE PROGRESS NOTE  Miguel Aschoff, MD [address] [place] [address]  DIAGNOSIS: Stage IV (T2b, N0, M1 a) non-small cell lung cancer with unclear subtype on the pathology report diagnosed in November 2022 and presented with right upper lobe lung mass as well as right upper lobe nodule in addition to right lower lobe nodule. In October 2024 she was found to have suspicious metachronous primary with RLL nodule and subcarinal node vs. Metastatic disease.    DETECTED ALTERATION(S) / BIOMARKER(S)     % CFDNA OR AMPLIFICATION        ASSOCIATED FDA-APPROVED THERAPIES         CLINICAL TRIAL AVAILABILITY [id number] 0.1% NoneNo   PD-L1 expression 1%  PRIOR THERAPY: SBRT to the left and right lung nodules under the care of Dr. Roselind Messier completed on June 24, 2021.   CURRENT THERAPY: Observation   INTERVAL HISTORY: Amy Sandoval 88 y.o. female returns to clinic today for follow-up visit accompanied by her daughter .  She is on surveillance for her lung cancer.  She was last seen by Dr. Arbutus Ped on 01/18/2023.  At that point time she had a restaging CT scan that showed enlargement of right lower lobe pulmonary nodule suspicious for metachronous primary or metastatic disease.  There is also developing thoracic adenopathy suspicious for nodal metastasis.  Dr. Arbutus Ped recommended a PET scan.  Otherwise, she denies any major changes in her health since she was last seen.  Denies any fever, chills, night sweats, or unexplained weight loss.  Denies any nausea, vomiting, diarrhea, or constipation.  He has baseline dyspnea on exertion for which she is on 3 L of supplemental oxygen at baseline.  Denies any cough, hemoptysis, or chest pain.  Denies any headache or visual changes.  She recently had a PET scan.  She is here today for evaluation to review her scan results and discuss the next steps.   MEDICAL HISTORY: Past Medical History:  Diagnosis Date   COPD (chronic  obstructive pulmonary disease) (HCC)    History of radiation therapy    RUL lung and LLL lung 06/15/2021-06/24/2021  Dr Antony Blackbird   Hx of cervical spine fracture 2/2 fall managed with neck brace 08/20/2022   Hx of herpes zoster infection of R thoracic region; no neuropathic pain    Non-small cell lung cancer (NSCLC) (HCC) 12/2020   Pneumonia    Sepsis (HCC) 12/18/2020    ALLERGIES:  is allergic to penicillins.  MEDICATIONS:  Current Outpatient Medications  Medication Sig Dispense Refill   Budeson-Glycopyrrol-Formoterol (BREZTRI AEROSPHERE) 160-9-4.8 MCG/ACT AERO Inhale 2 puffs into the lungs in the morning and at bedtime. 10.7 g 0   naproxen sodium (ALEVE) 220 MG tablet Take 220 mg by mouth as needed.     OXYGEN Inhale 3 L into the lungs as needed. When 02 sats drop below 92     No current facility-administered medications for this visit.    SURGICAL HISTORY:  Past Surgical History:  Procedure Laterality Date   BRONCHIAL BIOPSY  03/09/2021   Procedure: BRONCHIAL BIOPSIES;  Surgeon: Leslye Peer, MD;  Location: [facility] ENDOSCOPY;  Service: Pulmonary;;   BRONCHIAL BRUSHINGS  03/09/2021   Procedure: BRONCHIAL BRUSHINGS;  Surgeon: Leslye Peer, MD;  Location: [facility] ENDOSCOPY;  Service: Pulmonary;;   BRONCHIAL NEEDLE ASPIRATION BIOPSY  03/09/2021   Procedure: BRONCHIAL NEEDLE ASPIRATION BIOPSIES;  Surgeon: Leslye Peer, MD;  Location: [facility] ENDOSCOPY;  Service: Pulmonary;;   DILATION AND CURETTAGE  OF UTERUS     EYE SURGERY     bilateral cataract removal   TONSILLECTOMY     VIDEO BRONCHOSCOPY WITH ENDOBRONCHIAL NAVIGATION Bilateral 03/09/2021   Procedure: VIDEO BRONCHOSCOPY WITH ENDOBRONCHIAL NAVIGATION;  Surgeon: Leslye Peer, MD;  Location: [facility] ENDOSCOPY;  Service: Pulmonary;  Laterality: Bilateral;  ION w/ possible fiducial    REVIEW OF SYSTEMS:   Review of Systems  Constitutional: Negative for appetite change, chills, fatigue, fever and unexpected weight change.  HENT: Negative  for mouth sores, nosebleeds, sore throat and trouble swallowing.   Eyes: Negative for eye problems and icterus.  Respiratory: Positive for baseline dyspnea on exertion. Negative for cough, hemoptysis, and wheezing.   Cardiovascular: Negative for chest pain and leg swelling.  Gastrointestinal: Negative for abdominal pain, constipation, diarrhea, nausea and vomiting.  Genitourinary: Negative for bladder incontinence, difficulty urinating, dysuria, frequency and hematuria.   Musculoskeletal: Negative for back pain, gait problem, neck pain and neck stiffness.  Skin: Negative for itching and rash.  Neurological: Negative for dizziness, extremity weakness, gait problem, headaches, light-headedness and seizures.  Hematological: Negative for adenopathy. Does not bruise/bleed easily.  Psychiatric/Behavioral: Negative for confusion, depression and sleep disturbance. The patient is not nervous/anxious.     PHYSICAL EXAMINATION:  There were no vitals taken for this visit.  ECOG PERFORMANCE STATUS: 2  Physical Exam  Constitutional: Oriented to person, place, and time and elderly appearing female and in no distress. HENT:  Head: Normocephalic and atraumatic.  Mouth/Throat: Oropharynx is clear and moist. No oropharyngeal exudate.  Eyes: Conjunctivae are normal. Right eye exhibits no discharge. Left eye exhibits no discharge. No scleral icterus.  Neck: Normal range of motion. Neck supple.  Cardiovascular: Normal rate, regular rhythm, normal heart sounds and intact distal pulses.   Pulmonary/Chest: Effort normal. Quiet breath sounds bilaterally. No respiratory distress. No wheezes. No rales. On supplemental oxygen with 3 L.  Abdominal: Soft. Bowel sounds are normal. Exhibits no distension and no mass. There is no tenderness.  Musculoskeletal: Normal range of motion. Exhibits no edema.  Lymphadenopathy:    No cervical adenopathy.  Neurological: Alert and oriented to person, place, and time. Exhibits  muscle wasting. Examined in the wheelchair.  Skin: Skin is warm and dry. No rash noted. Not diaphoretic. No erythema. No pallor.  Psychiatric: Mood, memory and judgment normal.  Vitals reviewed.  LABORATORY DATA: Lab Results  Component Value Date   WBC 7.2 01/18/2023   HGB 11.8 (L) 01/18/2023   HCT 36.9 01/18/2023   MCV 103.7 (H) 01/18/2023   PLT 194 01/18/2023      Chemistry      Component Value Date/Time   NA 143 01/18/2023 1456   K 4.8 01/18/2023 1456   CL 100 01/18/2023 1456   CO2 38 (H) 01/18/2023 1456   BUN 13 01/18/2023 1456   CREATININE 0.72 01/18/2023 1456      Component Value Date/Time   CALCIUM 9.5 01/18/2023 1456   ALKPHOS 96 01/18/2023 1456   AST 32 01/18/2023 1456   ALT 28 01/18/2023 1456   BILITOT 0.4 01/18/2023 1456       RADIOGRAPHIC STUDIES:  CT Chest W Contrast  Result Date: 01/18/2023 CLINICAL DATA:  Staging of non-small-cell lung cancer. * Tracking Code: BO * EXAM: CT CHEST WITH CONTRAST TECHNIQUE: Multidetector CT imaging of the chest was performed during intravenous contrast administration. RADIATION DOSE REDUCTION: This exam was performed according to the departmental dose-optimization program which includes automated exposure control, adjustment of the mA and/or kV according  to patient size and/or use of iterative reconstruction technique. CONTRAST:  75mL OMNIPAQUE IOHEXOL 300 MG/ML  SOLN COMPARISON:  07/08/2022 FINDINGS: Cardiovascular: Advanced aortic and branch vessel atherosclerosis. Normal heart size, without pericardial effusion. Lad coronary artery calcification. No central pulmonary embolism, on this non-dedicated study. Mediastinum/Nodes: No supraclavicular adenopathy. No hilar adenopathy. Precarinal node measures 6 mm on 59/2 versus 5 mm on the prior. A subcarinal node measures 12 mm on 69/2 versus 5 mm on the prior. Lungs/Pleura: Trace left pleural fluid is new. Moderate centrilobular emphysema. Right apical irregular nodule measures 1.3 x  1.1 cm on 22/6 versus 2.0 x 1.5 cm on the prior. The more posterior right upper lobe treated nodule with adjacent architectural distortion and and volume loss measures 1.4 x 1.2 cm on 27/6 and is similar. Partially cavitary irregular lateral right lower lobe pulmonary nodule measures 11 x 7 mm on 116/6 and has enlarged from 7 x 5 mm on the prior exam. Left lower lobe solid nodule with presumably radiation induced surrounding ground-glass and septal thickening. This measures 13 x 11 mm on 85/6 versus 19 x 12 mm on the prior. Upper Abdomen: Subtle irregular hepatic capsule. Normal imaged portions of the spleen, stomach, gallbladder, right adrenal gland, right kidney. Mild left adrenal nodularity is unchanged. An upper pole left renal 1.3 cm low-density lesion is consistent with a cyst . In the absence of clinically indicated signs/symptoms require(s) no independent follow-up. 3 mm hypoattenuating pancreatic neck lesion on 164/2 is of doubtful clinical significance given patient age and comorbidities. Musculoskeletal: Osteopenia. Healing of posterolateral left eighth rib fracture, likely due to insufficiency in the setting of adjacent radiation. 4th posterolateral right rib displaced fracture is new on 32/2 and may also be due to insufficiency in the setting of adjacent radiation. IMPRESSION: 1. Decreased size of left lower and right apical pulmonary nodules. 2. Enlarging right lower lobe pulmonary nodule, highly suspicious for metachronous primary or metastatic disease. 3. Developing thoracic adenopathy, highly suspicious for nodal metastasis. 4. Similar right upper lobe pulmonary nodule. 5. Interval right fourth rib fracture. Healing of eighth left rib fracture. These are likely due to insufficiency from radiation necrosis. 6. Subtle irregular hepatic capsule. Correlate with risk factors for cirrhosis. 7. Incidental findings, including: Aortic atherosclerosis (ICD10-I70.0), coronary artery atherosclerosis and  emphysema (ICD10-J43.9). Electronically Signed   By: Jeronimo Greaves M.D.   On: 01/18/2023 09:20     ASSESSMENT/PLAN:  This is a very pleasant 88 year old Caucasian female with stage IV (T2b, N0, M1 a) non-small cell lung cancer.  She was diagnosed in November 2022 and presented with a right upper lobe lung mass and an adjacent nodule in addition to a synchronous primary versus Annye Asa a metastatic nodule in the left lower lobe.  She was diagnosed in November 2022.  She has no actionable mutations.  In October 2024 she was found to have suspicious metachronous primary with RLL nodule and subcarinal node vs. Metastatic disease.    The patient underwent SBRT to these lesions under the care of Dr. Roselind Messier. This was completed on 06/24/2021   Patient is currently on observation but had a restaging CT scan that showed enlargement of right lower lobe pulmonary nodule highly suspicious for metachronous primary or metastatic disease. There was also developing thoracic adenopathy highly suspicious for nodal metastasis.   The patient had a PET scan to further evaluate this.  The patient was seen with Dr. Arbutus Ped today.  Dr. Arbutus Ped personally and independently reviewed the scan and discussed the results  with the patient today.  The scan showed a 9 mm nodule in the right lung base which is suspicious for metastasis versus metachronous primary.  Was also a 10 mm subcarinal nodule suspicious for nodal recurrence.  Dr. Arbutus Ped recommends referral to Dr. Delton Coombes to see if she is a candidate for bronchoscopy.  If she is not a candidate for bronchoscopy, then Dr. Arbutus Ped may consider her for concurrent chemoradiation. Dr. Arbutus Ped explained this is likey stage IIIA.   Will plan to see her back tentatively in 2 and half to 3 weeks after possible biopsy and discuss next steps.  If unable to perform a biopsy, then we will see her sooner to discuss next steps.  I will send a message to Dr. Delton Coombes to help facilitate getting  her scheduled if he thinks bronchoscopy is appropriate.  The patient was advised to call immediately if she has any concerning symptoms in the interval. The patient voices understanding of current disease status and treatment options and is in agreement with the current care plan. All questions were answered. The patient knows to call the clinic with any problems, questions or concerns. We can certainly see the patient much sooner if necessary  No orders of the defined types were placed in this encounter.    Stefhanie Kachmar L Tiasha Helvie, PA-C 02/02/23  ADDENDUM: Hematology/Oncology Attending: I had a face-to-face encounter with the patient today.  I reviewed her records, lab, scan and recommended her care plan.  This is a very pleasant 88 years old white female diagnosed with suspicious stage IV (T2b, N0, M1a) non-small cell lung cancer that was initially diagnosed in November 2022 with no actionable mutation and PD-L1 expression of 1%.  She is status post SBRT to the left and right lung nodules under the care of Dr. Roselind Messier.  The patient had evidence for disease progression in October 2024.  She had CT scan of the chest and recently PET scan that showed hypermetabolic right lower lobe lung nodule in addition to subcarinal lymphadenopathy. I had a lengthy discussion with the patient and her daughter today about her current condition and treatment options. I recommended for the patient to see Dr. Delton Coombes for consideration of repeat bronchoscopy and biopsy of the right lower lobe lung nodule as well as the subcarinal lymphadenopathy for confirmation of the tissue diagnosis.  If the final pathology is consistent with non-small cell lung cancer, I would recommend for her course of concurrent chemoradiation with weekly carboplatin for AUC of 2 and paclitaxel 45 Mg/M2 for 6 to 7 weeks. The patient is interested in treatment and she will see Dr. Delton Coombes first for discussion of the bronchoscopy but if she is not a  good candidate for repeat bronchoscopy, we will treat her empirically with a course of concurrent chemoradiation. We will see her back for follow-up visit in 2-3 weeks for evaluation and discussion of her treatment based on the bronchoscopy results. The patient was advised to call immediately if she has any other concerning symptoms in the interval. The total time spent in the appointment was 30 minutes. Disclaimer: This note was dictated with voice recognition software. Similar sounding words can inadvertently be transcribed and may be missed upon review. Lajuana Matte, MD

## 2023-02-07 ENCOUNTER — Inpatient Hospital Stay: Payer: Medicare Other | Attending: Physician Assistant | Admitting: Physician Assistant

## 2023-02-07 VITALS — BP 137/64 | HR 76 | Temp 98.3°F | Resp 18 | Wt 138.7 lb

## 2023-02-07 DIAGNOSIS — R918 Other nonspecific abnormal finding of lung field: Secondary | ICD-10-CM | POA: Diagnosis not present

## 2023-02-07 DIAGNOSIS — Z923 Personal history of irradiation: Secondary | ICD-10-CM | POA: Insufficient documentation

## 2023-02-07 DIAGNOSIS — C3411 Malignant neoplasm of upper lobe, right bronchus or lung: Secondary | ICD-10-CM

## 2023-02-07 DIAGNOSIS — Z7189 Other specified counseling: Secondary | ICD-10-CM

## 2023-02-07 DIAGNOSIS — R591 Generalized enlarged lymph nodes: Secondary | ICD-10-CM | POA: Diagnosis not present

## 2023-02-07 NOTE — Patient Instructions (Signed)
-  The treatment that you will may receive consists of two chemotherapy drugs called Carboplatin and Paclitaxel (also called Taxol). The chemotherapy is a low dose. This is given once a week on Mondays and helps the radiation work better. Radiation is Mon-Friday for about 6.5 weeks. -We are planning on starting your treatment next week on _/_/_ but before your start your treatment, I would like you to attend a Chemotherapy Education Class. This involves having you sit down with one of our nurse educators. She will discuss with you one-on-one more details about your treatment as well as general information about resources here at the [facility].  -Your treatment will be given every week for about 6 weeks or so (as long as you are also receiving radiation). We will check your labs (blood work) once a week . Dr. Arbutus Ped or I will see you every other treatment just to make sure that you are doing well and that everything is on track. -We will get a CT scan about 3 weeks after you complete your treatment  Medications:  -Compazine was sent to your pharmacy. This medication is for nausea. You may take this every 6 hours as needed if you feel nausous.   Referrals -I will place the referral to radiation oncology to meet with you to discuss starting radiation treatment. Please be on the lookout for a phone call from them to schedule a consultation.   Follow Up:  -We will see you back for a follow up visit in __ weeks

## 2023-02-17 ENCOUNTER — Ambulatory Visit: Payer: Medicare Other | Admitting: Internal Medicine

## 2023-02-17 ENCOUNTER — Encounter: Payer: Self-pay | Admitting: Internal Medicine

## 2023-02-17 DIAGNOSIS — J449 Chronic obstructive pulmonary disease, unspecified: Secondary | ICD-10-CM | POA: Diagnosis not present

## 2023-02-17 DIAGNOSIS — J3489 Other specified disorders of nose and nasal sinuses: Secondary | ICD-10-CM | POA: Diagnosis not present

## 2023-02-17 DIAGNOSIS — J9611 Chronic respiratory failure with hypoxia: Secondary | ICD-10-CM | POA: Diagnosis not present

## 2023-02-17 DIAGNOSIS — Z0000 Encounter for general adult medical examination without abnormal findings: Secondary | ICD-10-CM | POA: Insufficient documentation

## 2023-02-17 DIAGNOSIS — E039 Hypothyroidism, unspecified: Secondary | ICD-10-CM

## 2023-02-17 DIAGNOSIS — F17210 Nicotine dependence, cigarettes, uncomplicated: Secondary | ICD-10-CM

## 2023-02-17 DIAGNOSIS — R7989 Other specified abnormal findings of blood chemistry: Secondary | ICD-10-CM | POA: Diagnosis not present

## 2023-02-17 HISTORY — DX: Other specified disorders of nose and nasal sinuses: J34.89

## 2023-02-17 MED ORDER — AZELASTINE HCL 0.1 % NA SOLN
NASAL | 12 refills | Status: DC
Start: 2023-02-17 — End: 2023-04-22

## 2023-02-17 MED ORDER — LEVOTHYROXINE SODIUM 25 MCG PO TABS
25.0000 ug | ORAL_TABLET | Freq: Every day | ORAL | 5 refills | Status: DC
Start: 2023-02-17 — End: 2023-07-14

## 2023-02-17 NOTE — Progress Notes (Signed)
[facility] Telephone Encounter Continuity Care Appointment   This is a telephone encounter between [facility] and Miguel Aschoff on 02/17/2023 for 6 month check in of chronic concerns. The visit was conducted with the patient located at home and Miguel Aschoff at [facility]. The patient's identity was confirmed using their DOB and current address. The patient has consented to being evaluated through a telephone encounter and understands the associated risks (an examination cannot be done and the patient may need to come in for an appointment) / benefits (allows the patient to remain at home, decreasing exposure to coronavirus). I personally spent 15 minutes on medical discussion.   88 year old Ms. Witthuhn's most recent appointment with me was as a new patient 08/2022.  She reports that she has been doing well, though with some concerns and updates.  Since our last visit, she underwent routine 91-month imaging for surveillance of her known lung cancer, which revealed progression (asymptomatic) and recommendation for treatment which may include chemotherapy.  She has a follow-up appointment to discuss particulars at the end of this month with Dr. Asa Lente group.  She is awaiting an appointment with pulmonology to determine if her nodule can be biopsied.  While disappointing news, this is something she is willing to pursue treatment wise "I will try it, and if I cannot stand it I can always quit".  ROS notable for significant cold intolerance noticed initially over the summer.  She does not endorse weight loss and has had no constipation.  She has a history of mildly elevated TSH and this is more fully discussed in the problem based documentation of assessment and plan.  She is very concerned about a new problem of right nostril clear dripping rhinorrhea which seems to occur primarily when she is standing up, as when trying to wash the dishes.  This has been going on for  more than a few months now.  See further description and A and P.    She continues to try to do a little bit of work in the house every day though does not have the endurance she once did infrequently with sit to rest.  She has had no falls.  Ambulates carefully with concern about tripping on a pet cat or the oxygen cord.  Problems discussed today (not ordered by priority):  COPD with chronic hypoxic respiratory failure [facility]) Ms. Schuessler feels no worsening of her COPD with hypoxic respiratory failure.  She feels tired with poor exercise endurance but doesn't feel that breathing is her limiting factor per se.  Doesn't appear that the loop was closed on her [facility] patient assistance application nor a f/u appt made with [place] pulmonary. She takes no maintenance COPD medication.   Elevated TSH Ms. Weldin has had stable mild elevation of her TSH from 2022 to 08/2022 when last checked; asymptomatic then.  For the past few months she has had marked cold intolerance.  Fatigued.  No constipation or hair loss.  Skin is always dry.  Given possibility of hypothyroidism, we elected empiric treatment with 25 micrograms levothyroxine (she takes no calcium) daily then recheck TSH and sxs in about 6 weeks.  She is in agreement.   Healthcare maintenance Recommended vaccines reviewed (PCV 20; TDAP; influenza) which she continues to decline.   Rhinorrhea, chronic right unilateral Few months of positional (when standing) clear thin rhinorrhea dripping from R nostril only, and no other associated sxs (no nasal congestion, sneeze, post nasal drainage, facial pain, headache, dizziness).  This occurs only when she stands (as when trying to wash the dishes - she must constantly blow/dab her nose); doesn't occur when bending forward necessarily, not when supine.  On occasion occurs during meals.  No prior hx of head/sinus surgery or intranasal procedures.  Has a hx of c spine fx treated non-operatively.  Asymmetry and  positional nature not triggered by the usual vasomotor rhinorrhea makes this less likely, though the more concerning possibility of CSF leakage would have to have occurred spontaneously and is curiously not accompanied by headache. Will treat with azelastine nasal spray for possibility of unilateral vasomotor rhinorrhea while referring to ENT for definitive diagnostics.

## 2023-02-17 NOTE — Assessment & Plan Note (Signed)
Ms. Monger has had stable mild elevation of her TSH from 2022 to 08/2022 when last checked; asymptomatic then.  For the past few months she has had marked cold intolerance.  Fatigued.  No constipation or hair loss.  Skin is always dry.  Given possibility of hypothyroidism, we elected empiric treatment with 25 micrograms levothyroxine (she takes no calcium) daily then recheck TSH and sxs in about 6 weeks.  She is in agreement.

## 2023-02-17 NOTE — Assessment & Plan Note (Signed)
Amy Sandoval feels no worsening of her COPD with hypoxic respiratory failure.  She feels tired with poor exercise endurance but doesn't feel that breathing is her limiting factor per se.  Doesn't appear that the loop was closed on her [facility] patient assistance application nor a f/u appt made with [place] pulmonary. She takes no maintenance COPD medication.

## 2023-02-17 NOTE — Assessment & Plan Note (Signed)
Recommended vaccines reviewed (PCV 20; TDAP; influenza) which she continues to decline.

## 2023-02-17 NOTE — Assessment & Plan Note (Signed)
Few months of positional (when standing) clear thin rhinorrhea dripping from R nostril only, and no other associated sxs (no nasal congestion, sneeze, post nasal drainage, facial pain, headache, dizziness).  This occurs only when she stands (as when trying to wash the dishes - she must constantly blow/dab her nose); doesn't occur when bending forward necessarily, not when supine.  On occasion occurs during meals.  No prior hx of head/sinus surgery or intranasal procedures.  Has a hx of c spine fx treated non-operatively.  Asymmetry and positional nature not triggered by the usual vasomotor rhinorrhea makes this less likely, though the more concerning possibility of CSF leakage would have to have occurred spontaneously and is curiously not accompanied by headache. Will treat with azelastine nasal spray for possibility of unilateral vasomotor rhinorrhea while referring to ENT for definitive diagnostics.

## 2023-02-27 NOTE — Progress Notes (Unsigned)
[facility] OFFICE PROGRESS NOTE  Miguel Aschoff, MD [address] [place] [address]  DIAGNOSIS:  Stage IV (T2b, N0, M1 a) non-small cell lung cancer with unclear subtype on the pathology report diagnosed in November 2022 and presented with right upper lobe lung mass as well as right upper lobe nodule in addition to right lower lobe nodule. In October 2024 she was found to have suspicious metachronous primary with RLL nodule and subcarinal node vs. Metastatic disease.    DETECTED ALTERATION(S) / BIOMARKER(S)     % CFDNA OR AMPLIFICATION        ASSOCIATED FDA-APPROVED THERAPIES         CLINICAL TRIAL AVAILABILITY [id number] 0.1% NoneNo   PD-L1 expression 1%  PRIOR THERAPY: SBRT to the left and right lung nodules under the care of Dr. Roselind Messier completed on June 24, 2021.   CURRENT THERAPY: Observation   INTERVAL HISTORY: Tiniya Benke 88 y.o. female returns returns to clinic today for follow-up visit accompanied by her daughter .  She is on surveillance for her lung cancer.  She was last seen by Dr. Arbutus Ped on 01/18/2023.  At that point time she had a restaging CT scan that showed enlargement of right lower lobe pulmonary nodule suspicious for metachronous primary or metastatic disease.  There is also developing thoracic adenopathy suspicious for nodal metastasis.  Dr. Arbutus Ped recommended a PET scan. The Pet showed suspicious findings for The scan showed a 9 mm nodule in the right lung base which is suspicious for metastasis versus metachronous primary.  Was also a 10 mm subcarinal nodule suspicious for nodal recurrence. The patient was re-referred to Dr. Delton Coombes for biopsy.   She ***  Otherwise, she denies any major changes in her health since she was last seen.  Denies any fever, chills, night sweats, or unexplained weight loss.  Denies any nausea, vomiting, diarrhea, or constipation.  He has baseline dyspnea on exertion for which she is on 3 L of supplemental  oxygen at baseline.  Denies any cough, hemoptysis, or chest pain.  Denies any headache or visual changes.   She is here today for evaluation to discuss the next steps.     MEDICAL HISTORY: Past Medical History:  Diagnosis Date   COPD (chronic obstructive pulmonary disease) (HCC)    COPD with chronic hypoxic respiratory failure (HCC) 08/17/2022   First diagnosis 12/2020 upon admission for multifocal pneumonia   [facility]  PFTs 2022 confirmed obstruction and mild reduction in DLCO     History of radiation therapy    RUL lung and LLL lung 06/15/2021-06/24/2021  Dr Antony Blackbird   Hx of cervical spine fracture 2/2 fall managed with neck brace 08/20/2022   Hx of herpes zoster infection of R thoracic region; no neuropathic pain    Non-small cell lung cancer (NSCLC) (HCC) 12/2020   Pneumonia    Rhinorrhea, chronic right unilateral 02/17/2023   Sepsis (HCC) 12/18/2020    ALLERGIES:  is allergic to penicillins.  MEDICATIONS:  Current Outpatient Medications  Medication Sig Dispense Refill   azelastine (ASTELIN) 0.1 % nasal spray 2 sprays in right nostril twice a day 30 mL 12   levothyroxine (SYNTHROID) 25 MCG tablet Take 1 tablet (25 mcg total) by mouth daily before breakfast. 30 tablet 5   naproxen sodium (ALEVE) 220 MG tablet Take 220 mg by mouth as needed.     OXYGEN Inhale 3 L into the lungs as needed. When 02 sats drop below 92  No current facility-administered medications for this visit.    SURGICAL HISTORY:  Past Surgical History:  Procedure Laterality Date   BRONCHIAL BIOPSY  03/09/2021   Procedure: BRONCHIAL BIOPSIES;  Surgeon: Leslye Peer, MD;  Location: [facility] ENDOSCOPY;  Service: Pulmonary;;   BRONCHIAL BRUSHINGS  03/09/2021   Procedure: BRONCHIAL BRUSHINGS;  Surgeon: Leslye Peer, MD;  Location: [facility] ENDOSCOPY;  Service: Pulmonary;;   BRONCHIAL NEEDLE ASPIRATION BIOPSY  03/09/2021   Procedure: BRONCHIAL NEEDLE ASPIRATION BIOPSIES;  Surgeon: Leslye Peer, MD;   Location: [facility] ENDOSCOPY;  Service: Pulmonary;;   DILATION AND CURETTAGE OF UTERUS     EYE SURGERY     bilateral cataract removal   TONSILLECTOMY     VIDEO BRONCHOSCOPY WITH ENDOBRONCHIAL NAVIGATION Bilateral 03/09/2021   Procedure: VIDEO BRONCHOSCOPY WITH ENDOBRONCHIAL NAVIGATION;  Surgeon: Leslye Peer, MD;  Location: [facility] ENDOSCOPY;  Service: Pulmonary;  Laterality: Bilateral;  ION w/ possible fiducial    REVIEW OF SYSTEMS:   Review of Systems  Constitutional: Negative for appetite change, chills, fatigue, fever and unexpected weight change.  HENT:   Negative for mouth sores, nosebleeds, sore throat and trouble swallowing.   Eyes: Negative for eye problems and icterus.  Respiratory: Negative for cough, hemoptysis, shortness of breath and wheezing.   Cardiovascular: Negative for chest pain and leg swelling.  Gastrointestinal: Negative for abdominal pain, constipation, diarrhea, nausea and vomiting.  Genitourinary: Negative for bladder incontinence, difficulty urinating, dysuria, frequency and hematuria.   Musculoskeletal: Negative for back pain, gait problem, neck pain and neck stiffness.  Skin: Negative for itching and rash.  Neurological: Negative for dizziness, extremity weakness, gait problem, headaches, light-headedness and seizures.  Hematological: Negative for adenopathy. Does not bruise/bleed easily.  Psychiatric/Behavioral: Negative for confusion, depression and sleep disturbance. The patient is not nervous/anxious.     PHYSICAL EXAMINATION:  There were no vitals taken for this visit.  ECOG PERFORMANCE STATUS: {CHL ONC ECOG [id number]  Physical Exam  Constitutional: Oriented to person, place, and time and well-developed, well-nourished, and in no distress. No distress.  HENT:  Head: Normocephalic and atraumatic.  Mouth/Throat: Oropharynx is clear and moist. No oropharyngeal exudate.  Eyes: Conjunctivae are normal. Right eye exhibits no discharge. Left eye exhibits no  discharge. No scleral icterus.  Neck: Normal range of motion. Neck supple.  Cardiovascular: Normal rate, regular rhythm, normal heart sounds and intact distal pulses.   Pulmonary/Chest: Effort normal and breath sounds normal. No respiratory distress. No wheezes. No rales.  Abdominal: Soft. Bowel sounds are normal. Exhibits no distension and no mass. There is no tenderness.  Musculoskeletal: Normal range of motion. Exhibits no edema.  Lymphadenopathy:    No cervical adenopathy.  Neurological: Alert and oriented to person, place, and time. Exhibits normal muscle tone. Gait normal. Coordination normal.  Skin: Skin is warm and dry. No rash noted. Not diaphoretic. No erythema. No pallor.  Psychiatric: Mood, memory and judgment normal.  Vitals reviewed.  LABORATORY DATA: Lab Results  Component Value Date   WBC 7.2 01/18/2023   HGB 11.8 (L) 01/18/2023   HCT 36.9 01/18/2023   MCV 103.7 (H) 01/18/2023   PLT 194 01/18/2023      Chemistry      Component Value Date/Time   NA 143 01/18/2023 1456   K 4.8 01/18/2023 1456   CL 100 01/18/2023 1456   CO2 38 (H) 01/18/2023 1456   BUN 13 01/18/2023 1456   CREATININE 0.72 01/18/2023 1456      Component Value Date/Time   CALCIUM  9.5 01/18/2023 1456   ALKPHOS 96 01/18/2023 1456   AST 32 01/18/2023 1456   ALT 28 01/18/2023 1456   BILITOT 0.4 01/18/2023 1456       RADIOGRAPHIC STUDIES:  No results found.   ASSESSMENT/PLAN:  This is a very pleasant 88 year old Caucasian female with stage IV (T2b, N0, M1 a) non-small cell lung cancer.  She was diagnosed in November 2022 and presented with a right upper lobe lung mass and an adjacent nodule in addition to a synchronous primary versus Annye Asa a metastatic nodule in the left lower lobe.  She was diagnosed in November 2022.  She has no actionable mutations.   In October 2024 she was found to have suspicious metachronous primary with RLL nodule and subcarinal node vs. Metastatic disease.    The  patient underwent SBRT to these lesions under the care of Dr. Roselind Messier. This was completed on 06/24/2021   Patient is currently on observation but had a restaging CT scan that showed enlargement of right lower lobe pulmonary nodule highly suspicious for metachronous primary or metastatic disease. There was also developing thoracic adenopathy highly suspicious for nodal metastasis.    The patient had a PET scan to further evaluate this. The scan showed a 9 mm nodule in the right lung base which is suspicious for metastasis versus metachronous primary.  Was also a 10 mm subcarinal nodule suspicious for nodal recurrence.   She was referred to pulmonary medicine for consideration of biopsy ***.   The patient was seen with Dr. Arbutus Ped. Dr. Arbutus Ped recommends *** Dr. Arbutus Ped had a lengthly discussion with the patient today about her current condition and treatment options. Dr. Arbutus Ped recommendation of concurrent chemoradiation with carboplatin for an AUC of 2 and paclitaxel 45 mg/m2. The patient is interested in proceeding with systemic chemotherapy.  She is expected to start her first dose of this treatment on __.  We discussed the adverse side effects of treatment including but not limited to alopecia, myelosuppression, nausea and vomiting, peripheral neuropathy, liver or renal dysfunction as well as immunotherapy mediated adverse effects.   I will arrange for the patient to have a chemoeducation class prior to receiving her first cycle of chemotherapy.    I sent prescription for Compazine 10 mg every 6 hours as needed for nausea.   The patient will follow-up in 3 weeks for a evaulation prior to starting cycle #2 of chemotherapy.  The patient was advised to call immediately if she has any concerning symptoms in the interval. The patient voices understanding of current disease status and treatment options and is in agreement with the current care plan. All questions were answered. The patient knows to  call the clinic with any problems, questions or concerns. We can certainly see the patient much sooner if necessary   I will place a referral to radiation oncology to discuss concu  No orders of the defined types were placed in this encounter.    I spent {CHL ONC TIME VISIT - GEXBM:[id number]} counseling the patient face to face. The total time spent in the appointment was {CHL ONC TIME VISIT - UVOZD:[id number]}.  Jerod Mcquain L Keionna Kinnaird, PA-C 02/27/23

## 2023-03-01 ENCOUNTER — Inpatient Hospital Stay: Payer: Medicare Other

## 2023-03-01 ENCOUNTER — Telehealth: Payer: Self-pay

## 2023-03-01 ENCOUNTER — Inpatient Hospital Stay (HOSPITAL_BASED_OUTPATIENT_CLINIC_OR_DEPARTMENT_OTHER): Payer: Medicare Other | Admitting: Physician Assistant

## 2023-03-01 VITALS — BP 134/96 | HR 87 | Temp 98.3°F | Resp 16 | Wt 137.8 lb

## 2023-03-01 DIAGNOSIS — C3411 Malignant neoplasm of upper lobe, right bronchus or lung: Secondary | ICD-10-CM

## 2023-03-01 LAB — CMP (CANCER CENTER ONLY)
ALT: 31 U/L (ref 0–44)
AST: 37 U/L (ref 15–41)
Albumin: 4.4 g/dL (ref 3.5–5.0)
Alkaline Phosphatase: 91 U/L (ref 38–126)
Anion gap: 7 (ref 5–15)
BUN: 13 mg/dL (ref 8–23)
CO2: 35 mmol/L — ABNORMAL HIGH (ref 22–32)
Calcium: 10 mg/dL (ref 8.9–10.3)
Chloride: 99 mmol/L (ref 98–111)
Creatinine: 0.65 mg/dL (ref 0.44–1.00)
GFR, Estimated: >60 mL/min (ref >60)
Glucose, Bld: 105 mg/dL — ABNORMAL HIGH (ref 70–99)
Potassium: 5.3 mmol/L — ABNORMAL HIGH (ref 3.5–5.1)
Sodium: 141 mmol/L (ref 135–145)
Total Bilirubin: 0.5 mg/dL (ref 0.3–1.2)
Total Protein: 7.6 g/dL (ref 6.5–8.1)

## 2023-03-01 LAB — CBC WITH DIFFERENTIAL (CANCER CENTER ONLY)
Abs Immature Granulocytes: 0.01 10*3/uL (ref 0.00–0.07)
Basophils Absolute: 0 10*3/uL (ref 0.0–0.1)
Basophils Relative: 1 %
Eosinophils Absolute: 0.1 10*3/uL (ref 0.0–0.5)
Eosinophils Relative: 2 %
HCT: 39.2 % (ref 36.0–46.0)
Hemoglobin: 12.3 g/dL (ref 12.0–15.0)
Immature Granulocytes: 0 %
Lymphocytes Relative: 26 %
Lymphs Abs: 1.7 10*3/uL (ref 0.7–4.0)
MCH: 33.2 pg (ref 26.0–34.0)
MCHC: 31.4 g/dL (ref 30.0–36.0)
MCV: 105.7 fL — ABNORMAL HIGH (ref 80.0–100.0)
Monocytes Absolute: 0.6 10*3/uL (ref 0.1–1.0)
Monocytes Relative: 9 %
Neutro Abs: 4.1 10*3/uL (ref 1.7–7.7)
Neutrophils Relative %: 62 %
Platelet Count: 182 10*3/uL (ref 150–400)
RBC: 3.71 MIL/uL — ABNORMAL LOW (ref 3.87–5.11)
RDW: 12.5 % (ref 11.5–15.5)
WBC Count: 6.5 10*3/uL (ref 4.0–10.5)
nRBC: 0 % (ref 0.0–0.2)

## 2023-03-01 NOTE — Telephone Encounter (Signed)
Called pt and LVM in regards to elevated potassium.  Informed her that if she's taking any potassium supplements to stop and decrease potassium rich foods. Informed her to call our office with any questions or concerns.

## 2023-03-09 ENCOUNTER — Other Ambulatory Visit: Payer: Self-pay | Admitting: Internal Medicine

## 2023-03-09 ENCOUNTER — Telehealth: Payer: Self-pay

## 2023-03-09 DIAGNOSIS — J3489 Other specified disorders of nose and nasal sinuses: Secondary | ICD-10-CM

## 2023-03-09 NOTE — Telephone Encounter (Signed)
Referral was placed at [facility] pulmonary care on 10/7.  Called office to check on referral- the have received it but no call has been made to pt for an appt yet.  Staff at the office said that the pt can call with an appt if they would like.    Informed the pts daughter about the above.  Informed daughter of the phone number [phone]) to call and make appt with the pt. Daughter verbalized understanding and was told to call with any questions.

## 2023-03-14 ENCOUNTER — Encounter (INDEPENDENT_AMBULATORY_CARE_PROVIDER_SITE_OTHER): Payer: Self-pay | Admitting: Otolaryngology

## 2023-03-14 ENCOUNTER — Telehealth: Payer: Self-pay | Admitting: Emergency Medicine

## 2023-03-14 DIAGNOSIS — R590 Localized enlarged lymph nodes: Secondary | ICD-10-CM

## 2023-03-14 DIAGNOSIS — R911 Solitary pulmonary nodule: Secondary | ICD-10-CM

## 2023-03-14 NOTE — Telephone Encounter (Signed)
Wants to speak to Dr. Delton Coombes about her oncology

## 2023-03-18 NOTE — Telephone Encounter (Signed)
Dr Delton Coombes, please advise if you are able to reach out to pt.

## 2023-03-21 ENCOUNTER — Encounter: Payer: Self-pay | Admitting: Emergency Medicine

## 2023-03-21 NOTE — Addendum Note (Signed)
Addended by: Leslye Peer on: 03/21/2023 02:41 PM   Modules accepted: Orders

## 2023-03-21 NOTE — Telephone Encounter (Signed)
I reviewed the patient's CT and PET scan with her by phone.  She has a new suspicious right lower lobe pulmonary nodule as well as some mediastinal adenopathy.  I have recommended a repeat bronchoscopy as has Dr. Arbutus Ped with oncology.  She understands and agrees.  I will try to get this set up for 12/2 or 04/05/2023.  She is not on any anticoagulation.  She will need robotic Nav and also EBUS.

## 2023-03-28 ENCOUNTER — Ambulatory Visit
Admission: RE | Admit: 2023-03-28 | Discharge: 2023-03-28 | Disposition: A | Discharge status: Home / Self Care | Payer: Medicare Other | Source: Ambulatory Visit | Attending: Emergency Medicine | Admitting: Emergency Medicine

## 2023-03-28 DIAGNOSIS — R590 Localized enlarged lymph nodes: Secondary | ICD-10-CM

## 2023-03-28 DIAGNOSIS — R911 Solitary pulmonary nodule: Secondary | ICD-10-CM

## 2023-03-29 ENCOUNTER — Encounter (HOSPITAL_COMMUNITY): Payer: Self-pay | Admitting: Emergency Medicine

## 2023-03-29 ENCOUNTER — Other Ambulatory Visit: Payer: Self-pay

## 2023-03-29 NOTE — Progress Notes (Signed)
SDW call  Patient was given pre-op instructions over the phone. Patient verbalized understanding of instructions provided.     PCP - Dr. Charissa Bash Cardiologist -  Pulmonary:    PPM/ICD - denies Device Orders - na Rep Notified - na   Chest x-ray - 11/25/2021 EKG -  na Stress Test - ECHO - 12/19/2020 Cardiac Cath -   Sleep Study/sleep apnea/CPAP: denies  Non-diabetic   Blood Thinner Instructions: denies Aspirin Instructions:denies   ERAS Protcol - NPO   COVID TEST- na    Anesthesia review: No   Patient c/o shortness of breath daily, uses PRN oxygen. Denies fever, cough and chest pain over the phone call  Your procedure is scheduled on Monday April 04, 2023  Report to [facility] Main Entrance "A" at  1115  A.M., then check in with the Admitting office.  Call this number if you have problems the morning of surgery:  [phone]   If you have any questions prior to your surgery date call [phone]: Open Monday-Friday 8am-4pm If you experience any cold or flu symptoms such as cough, fever, chills, shortness of breath, etc. between now and your scheduled surgery, please notify us at the above number    Remember:  Do not eat or drink  after midnight the night before your surgery  Take these medicines the morning of surgery with A SIP OF WATER:  Synthroid, astelin  As needed: oxygen  As of today, STOP taking any Aspirin (unless otherwise instructed by your surgeon) Aleve, Naproxen, Ibuprofen, Motrin, Advil, Goody's, BC's, all herbal medications, fish oil, and all vitamins.

## 2023-04-04 ENCOUNTER — Other Ambulatory Visit: Payer: Self-pay

## 2023-04-04 ENCOUNTER — Telehealth: Payer: Self-pay | Admitting: Emergency Medicine

## 2023-04-04 ENCOUNTER — Ambulatory Visit (HOSPITAL_COMMUNITY)
Admission: RE | Admit: 2023-04-04 | Discharge: 2023-04-04 | Disposition: A | Discharge status: Home / Self Care | Payer: Medicare Other | Attending: Emergency Medicine | Admitting: Emergency Medicine

## 2023-04-04 ENCOUNTER — Encounter (HOSPITAL_COMMUNITY)
Admission: RE | Disposition: A | Discharge status: Home / Self Care | Payer: Self-pay | Source: Home / Self Care | Attending: Emergency Medicine

## 2023-04-04 ENCOUNTER — Ambulatory Visit (HOSPITAL_COMMUNITY): Payer: Medicare Other

## 2023-04-04 ENCOUNTER — Encounter (HOSPITAL_COMMUNITY): Payer: Self-pay | Admitting: Emergency Medicine

## 2023-04-04 ENCOUNTER — Ambulatory Visit (HOSPITAL_COMMUNITY): Payer: Medicare Other | Admitting: Anesthesiology

## 2023-04-04 DIAGNOSIS — Z9981 Dependence on supplemental oxygen: Secondary | ICD-10-CM | POA: Insufficient documentation

## 2023-04-04 DIAGNOSIS — R911 Solitary pulmonary nodule: Secondary | ICD-10-CM | POA: Diagnosis not present

## 2023-04-04 DIAGNOSIS — Z9889 Other specified postprocedural states: Secondary | ICD-10-CM | POA: Insufficient documentation

## 2023-04-04 DIAGNOSIS — Z87891 Personal history of nicotine dependence: Secondary | ICD-10-CM | POA: Insufficient documentation

## 2023-04-04 DIAGNOSIS — C969 Malignant neoplasm of lymphoid, hematopoietic and related tissue, unspecified: Secondary | ICD-10-CM

## 2023-04-04 DIAGNOSIS — R590 Localized enlarged lymph nodes: Secondary | ICD-10-CM | POA: Diagnosis not present

## 2023-04-04 DIAGNOSIS — Z9089 Acquired absence of other organs: Secondary | ICD-10-CM | POA: Diagnosis not present

## 2023-04-04 DIAGNOSIS — J449 Chronic obstructive pulmonary disease, unspecified: Secondary | ICD-10-CM | POA: Insufficient documentation

## 2023-04-04 DIAGNOSIS — C3411 Malignant neoplasm of upper lobe, right bronchus or lung: Secondary | ICD-10-CM | POA: Diagnosis present

## 2023-04-04 DIAGNOSIS — Z01818 Encounter for other preprocedural examination: Secondary | ICD-10-CM

## 2023-04-04 HISTORY — PX: ENDOBRONCHIAL ULTRASOUND: SHX5096

## 2023-04-04 HISTORY — PX: BRONCHIAL BIOPSY: SHX5109

## 2023-04-04 HISTORY — PX: BRONCHIAL BRUSHINGS: SHX5108

## 2023-04-04 HISTORY — PX: FIDUCIAL MARKER PLACEMENT: SHX6858

## 2023-04-04 HISTORY — DX: Unspecified osteoarthritis, unspecified site: M19.90

## 2023-04-04 HISTORY — PX: BRONCHIAL NEEDLE ASPIRATION BIOPSY: SHX5106

## 2023-04-04 LAB — CBC
HCT: 38.8 % (ref 36.0–46.0)
Hemoglobin: 12.1 g/dL (ref 12.0–15.0)
MCH: 33.1 pg (ref 26.0–34.0)
MCHC: 31.2 g/dL (ref 30.0–36.0)
MCV: 106 fL — ABNORMAL HIGH (ref 80.0–100.0)
Platelets: 207 10*3/uL (ref 150–400)
RBC: 3.66 MIL/uL — ABNORMAL LOW (ref 3.87–5.11)
RDW: 12.5 % (ref 11.5–15.5)
WBC: 7.4 10*3/uL (ref 4.0–10.5)
nRBC: 0 % (ref 0.0–0.2)

## 2023-04-04 LAB — BASIC METABOLIC PANEL
Anion gap: 11 (ref 5–15)
BUN: 12 mg/dL (ref 8–23)
CO2: 28 mmol/L (ref 22–32)
Calcium: 9.2 mg/dL (ref 8.9–10.3)
Chloride: 100 mmol/L (ref 98–111)
Creatinine, Ser: 0.67 mg/dL (ref 0.44–1.00)
GFR, Estimated: >60 mL/min (ref >60)
Glucose, Bld: 86 mg/dL (ref 70–99)
Potassium: 4.9 mmol/L (ref 3.5–5.1)
Sodium: 139 mmol/L (ref 135–145)

## 2023-04-04 SURGERY — BRONCHOSCOPY, WITH BIOPSY USING ELECTROMAGNETIC NAVIGATION
Anesthesia: General | Laterality: Right

## 2023-04-04 MED ORDER — PHENYLEPHRINE HCL-NACL 20-0.9 MG/250ML-% IV SOLN
INTRAVENOUS | Status: DC | PRN
  Administered 2023-04-04: 30 ug/min via INTRAVENOUS

## 2023-04-04 MED ORDER — OXYCODONE HCL 5 MG/5ML PO SOLN: 5.0000 mg | Freq: Once | ORAL | Status: DC | PRN

## 2023-04-04 MED ORDER — FENTANYL CITRATE (PF) 250 MCG/5ML IJ SOLN
INTRAMUSCULAR | Status: DC | PRN
  Administered 2023-04-04 (×2): 50 ug via INTRAVENOUS

## 2023-04-04 MED ORDER — PROPOFOL 10 MG/ML IV BOLUS
INTRAVENOUS | Status: DC | PRN
  Administered 2023-04-04: 110 mg via INTRAVENOUS

## 2023-04-04 MED ORDER — LIDOCAINE 2% (20 MG/ML) 5 ML SYRINGE
INTRAMUSCULAR | Status: DC | PRN
  Administered 2023-04-04: 60 mg via INTRAVENOUS

## 2023-04-04 MED ORDER — ONDANSETRON HCL 4 MG/2ML IJ SOLN: 4.0000 mg | Freq: Once | INTRAMUSCULAR | Status: DC | PRN

## 2023-04-04 MED ORDER — ONDANSETRON HCL 4 MG/2ML IJ SOLN
INTRAMUSCULAR | Status: DC | PRN
  Administered 2023-04-04: 4 mg via INTRAVENOUS

## 2023-04-04 MED ORDER — LACTATED RINGERS IV SOLN: INTRAVENOUS | Status: DC

## 2023-04-04 MED ORDER — FENTANYL CITRATE (PF) 100 MCG/2ML IJ SOLN
INTRAMUSCULAR | Status: AC
  Filled 2023-04-04: qty 2

## 2023-04-04 MED ORDER — CHLORHEXIDINE GLUCONATE 0.12 % MT SOLN
15.0000 mL | Freq: Once | OROMUCOSAL | Status: AC
  Administered 2023-04-04: 15 mL via OROMUCOSAL
  Filled 2023-04-04: qty 15

## 2023-04-04 MED ORDER — SUGAMMADEX SODIUM 200 MG/2ML IV SOLN
INTRAVENOUS | Status: DC | PRN
  Administered 2023-04-04: 200 mg via INTRAVENOUS

## 2023-04-04 MED ORDER — ROCURONIUM BROMIDE 10 MG/ML (PF) SYRINGE
PREFILLED_SYRINGE | INTRAVENOUS | Status: DC | PRN
  Administered 2023-04-04: 60 mg via INTRAVENOUS

## 2023-04-04 MED ORDER — PHENYLEPHRINE 80 MCG/ML (10ML) SYRINGE FOR IV PUSH (FOR BLOOD PRESSURE SUPPORT)
PREFILLED_SYRINGE | INTRAVENOUS | Status: DC | PRN
  Administered 2023-04-04: 160 ug via INTRAVENOUS

## 2023-04-04 MED ORDER — FENTANYL CITRATE (PF) 100 MCG/2ML IJ SOLN: 25.0000 ug | INTRAMUSCULAR | Status: DC | PRN

## 2023-04-04 MED ORDER — PROPOFOL 500 MG/50ML IV EMUL
INTRAVENOUS | Status: DC | PRN
  Administered 2023-04-04: 125 ug/kg/min via INTRAVENOUS

## 2023-04-04 MED ORDER — OXYCODONE HCL 5 MG PO TABS: 5.0000 mg | ORAL_TABLET | Freq: Once | ORAL | Status: DC | PRN

## 2023-04-04 NOTE — Telephone Encounter (Signed)
We can cancel this patient's office visit with RB on 05/12/2023

## 2023-04-04 NOTE — H&P (Signed)
Amy Sandoval is an 88 y.o. female.   Chief Complaint: Right lower lobe pulmonary nodule, subcarinal adenopathy HPI:  88 year old woman with a history of tobacco use and COPD, stage IV non-small cell lung cancer that has been treated with by SBRT to a right upper lobe nodule, left lower lobe nodule, last in 06/2021.  She is under surveillance and chest imaging has revealed a new right lower lobe nodule as well as subcarinal adenopathy.  These were hypermetabolic on PET  Scan that was done 01/27/2023.  She presents today for bronchoscopy to further evaluate these abnormalities.  She denies any dyspnea, chest discomfort, cough.  She understands the risks, benefits, rationale of the procedure and agrees to proceed.    Past Medical History:  Diagnosis Date   Arthritis    COPD (chronic obstructive pulmonary disease) (HCC)    COPD with chronic hypoxic respiratory failure (HCC) 08/17/2022   First diagnosis 12/2020 upon admission for multifocal pneumonia   [place] pulmonary  PFTs 2022 confirmed obstruction and mild reduction in DLCO     History of radiation therapy    RUL lung and LLL lung 06/15/2021-06/24/2021  Dr Antony Blackbird   Hx of cervical spine fracture 2/2 fall managed with neck brace 08/20/2022   Hx of herpes zoster infection of R thoracic region; no neuropathic pain    Non-small cell lung cancer (NSCLC) (HCC) 12/2020   Pneumonia    Rhinorrhea, chronic right unilateral 02/17/2023   Sepsis (HCC) 12/18/2020    Past Surgical History:  Procedure Laterality Date   BRONCHIAL BIOPSY  03/09/2021   Procedure: BRONCHIAL BIOPSIES;  Surgeon: Leslye Peer, MD;  [facility] ENDOSCOPY;  Service: Pulmonary;;   BRONCHIAL BRUSHINGS  03/09/2021   Procedure: BRONCHIAL BRUSHINGS;  Surgeon: Leslye Peer, MD;  Location: [facility] ENDOSCOPY;  Service: Pulmonary;;   BRONCHIAL NEEDLE ASPIRATION BIOPSY  03/09/2021   Procedure: BRONCHIAL NEEDLE ASPIRATION BIOPSIES;  Surgeon: Leslye Peer, MD;  Location: [facility] ENDOSCOPY;   Service: Pulmonary;;   DILATION AND CURETTAGE OF UTERUS     EYE SURGERY     bilateral cataract removal   TONSILLECTOMY     VIDEO BRONCHOSCOPY WITH ENDOBRONCHIAL NAVIGATION Bilateral 03/09/2021   Procedure: VIDEO BRONCHOSCOPY WITH ENDOBRONCHIAL NAVIGATION;  Surgeon: Leslye Peer, MD;  Location: [facility] ENDOSCOPY;  Service: Pulmonary;  Laterality: Bilateral;  ION w/ possible fiducial    Family History  Problem Relation Age of Onset   Hypertension Other    Social History:  reports that she has quit smoking. Her smoking use included cigarettes. She started smoking about 2 years ago. She has a 2.9 pack-year smoking history. She has been exposed to tobacco smoke. She has never used smokeless tobacco. She reports current alcohol use. She reports that she does not use drugs.  Allergies:  Allergies  Allergen Reactions   Penicillins Rash    Medications Prior to Admission  Medication Sig Dispense Refill   azelastine (ASTELIN) 0.1 % nasal spray 2 sprays in right nostril twice a day 30 mL 12   levothyroxine (SYNTHROID) 25 MCG tablet Take 1 tablet (25 mcg total) by mouth daily before breakfast. 30 tablet 5   naproxen sodium (ALEVE) 220 MG tablet Take 220 mg by mouth as needed.     OXYGEN Inhale 3 L into the lungs as needed. When 02 sats drop below 92      Results for orders placed or performed during the hospital encounter of 04/04/23 (from the past 48 hour(s))  CBC  Status: Abnormal   Collection Time: 04/04/23 11:44 AM  Result Value Ref Range   WBC 7.4 4.0 - 10.5 K/uL   RBC 3.66 (L) 3.87 - 5.11 MIL/uL   Hemoglobin 12.1 12.0 - 15.0 g/dL   HCT 16.1 09.6 - 04.5 %   MCV 106.0 (H) 80.0 - 100.0 fL   MCH 33.1 26.0 - 34.0 pg   MCHC 31.2 30.0 - 36.0 g/dL   RDW 40.9 81.1 - 91.4 %   Platelets 207 150 - 400 K/uL   nRBC 0.0 0.0 - 0.2 %    Comment: Performed at [facility], [address], [place], [place] [address]  Basic metabolic panel     Status: None   Collection Time: 04/04/23 11:44  AM  Result Value Ref Range   Sodium 139 135 - 145 mmol/L   Potassium 4.9 3.5 - 5.1 mmol/L   Chloride 100 98 - 111 mmol/L   CO2 28 22 - 32 mmol/L   Glucose, Bld 86 70 - 99 mg/dL    Comment: Glucose reference range applies only to samples taken after fasting for at least 8 hours.   BUN 12 8 - 23 mg/dL   Creatinine, Ser 6.21 0.44 - 1.00 mg/dL   Calcium 9.2 8.9 - 30.8 mg/dL   GFR, Estimated >65 >78 mL/min    Comment: (NOTE) Calculated using the CKD-EPI Creatinine Equation (2021)    Anion gap 11 5 - 15    Comment: Performed at [facility], [address], [place], [place] [address]   No results found.  Review of Systems As per HPI Blood pressure (!) 168/87, pulse 93, temperature 98.4 F (36.9 C), temperature source Oral, resp. rate 20, height 5\' 5"  (1.651 m), weight 62.1 kg, SpO2 94%. Physical Exam  Gen: Pleasant, well-nourished, in no distress,  normal affect  ENT: No lesions,  mouth clear,  oropharynx clear, no postnasal drip  Neck: No JVD, no stridor  Lungs: No use of accessory muscles, no crackles or wheezing on normal respiration, no wheeze on forced expiration  Cardiovascular: RRR, heart sounds normal, no murmur or gallops, no peripheral edema  Abdomen: soft and NT, no HSM,  BS normal  Musculoskeletal: No deformities, no cyanosis or clubbing  Neuro: alert, awake, non focal  Skin: Warm, no lesions or rashes    Assessment/Plan History of non-small cell lung cancer with prior SBRT, now with suspicious right lower lobe pulmonary nodule, subcarinal adenopathy.  She presents for bronchoscopy to achieve a tissue diagnosis and help plan next steps in her treatment.  We will plan for navigational bronchoscopy to the right lower lobe nodule, EBUS to the subcarinal node.  We will try to get enough tissue to facilitate molecular testing.  Patient understands the risks, benefits and elects to proceed.  No barriers identified.  Leslye Peer, MD 04/04/2023, 1:28 PM

## 2023-04-04 NOTE — Transfer of Care (Addendum)
Immediate Anesthesia Transfer of Care Note  Patient: Amy Sandoval  Procedure(s) Performed: ROBOTIC ASSISTED NAVIGATIONAL BRONCHOSCOPY (Right) ENDOBRONCHIAL ULTRASOUND WITH FLURO (Right) BRONCHIAL NEEDLE ASPIRATION BIOPSIES BRONCHIAL BIOPSIES BRONCHIAL BRUSHINGS FIDUCIAL MARKER PLACEMENT  Patient Location: Endoscopy Unit  Anesthesia Type:General  Level of Consciousness: drowsy and patient cooperative  Airway & Oxygen Therapy: Patient Spontanous Breathing and Patient connected to nasal cannula oxygen  Post-op Assessment: Report given to RN and Post -op Vital signs reviewed and stable  Post vital signs: Reviewed and stable  Last Vitals:  Vitals Value Taken Time  BP 79/63 04/04/23 1525  Temp 36.2 C 04/04/23 1523  Pulse 66 04/04/23 1527  Resp 16 04/04/23 1527  SpO2 93 % 04/04/23 1527  Vitals shown include unfiled device data.  Last Pain:  Vitals:   04/04/23 1523  TempSrc: Temporal  PainSc: 0-No pain         Complications: No notable events documented.

## 2023-04-04 NOTE — Op Note (Signed)
Video Bronchoscopy with Robotic Assisted Bronchoscopic Navigation and Endobronchial Ultrasound Procedure Note  Date of Operation: 04/04/2023   Pre-op Diagnosis: Right lower lobe nodule, mediastinal adenopathy  Post-op Diagnosis: Same  Surgeon: Levy Pupa  Assistants: None  Anesthesia: General endotracheal anesthesia  Operation: Flexible video fiberoptic bronchoscopy with robotic assistance and biopsies.  Estimated Blood Loss: Minimal  Complications: None  Indications and History: Amy Sandoval is a 88 y.o. female with history of tobacco use and non-small cell lung cancer that has been treated in the past with SBRT.  She has a new hypermetabolic right lower lobe pulmonary nodule as well as hypermetabolic mediastinal nodes on PET scan.  Recommendation made to achieve a tissue diagnosis via robotic assisted navigational bronchoscopy, endobronchial ultrasound and biopsies. The risks, benefits, complications, treatment options and expected outcomes were discussed with the patient.  The possibilities of pneumothorax, pneumonia, reaction to medication, pulmonary aspiration, perforation of a viscus, bleeding, failure to diagnose a condition and creating a complication requiring transfusion or operation were discussed with the patient who freely signed the consent.    Description of Procedure: The patient was seen in the Preoperative Area, was examined and was deemed appropriate to proceed.  The patient was taken to [facility] endoscopy room 3, identified as Amy Sandoval and the procedure verified as Flexible Video Fiberoptic Bronchoscopy.  A Time Out was held and the above information confirmed.   Prior to the date of the procedure a high-resolution CT scan of the chest was performed. Utilizing ION software program a virtual tracheobronchial tree was generated to allow the creation of distinct navigation pathways to the patient's parenchymal abnormalities. After being taken to the operating room  general anesthesia was initiated and the patient  was orally intubated. The video fiberoptic bronchoscope was introduced via the endotracheal tube and a general inspection was performed which showed normal right and left lung anatomy. Aspiration of the bilateral mainstems was completed to remove any remaining secretions. Robotic catheter inserted into patient's endotracheal tube.   Target #1 right lower lobe pulmonary nodule: The distinct navigation pathways prepared prior to this procedure were then utilized to navigate to patient's lesion identified on CT scan. The robotic catheter was secured into place and the vision probe was withdrawn.  Lesion location was approximated using fluoroscopy.  Local registration and targeting was performed using Cios three-dimensional imaging. Under fluoroscopic guidance transbronchial brushings, transbronchial needle biopsies, and transbronchial forceps biopsies were performed to be sent for cytology and pathology.  Under fluoroscopic guidance a single fiducial marker was placed adjacent to the nodule.  The robotic scope was then withdrawn and the endobronchial ultrasound was used to identify and characterize the peritracheal, hilar and bronchial lymph nodes. Inspection showed enlargement at station 4L, 4R, and 7. Using real-time ultrasound guidance Wang needle biopsies were take from Station 4L, 4R, 7 nodes and were sent for cytology.    At the end of the procedure a general airway inspection was performed and there was no evidence of active bleeding. The bronchoscope was removed.  The patient tolerated the procedure well. There was no significant blood loss and there were no obvious complications. A post-procedural chest x-ray is pending.  Samples Target #1: 1. Transbronchial needle brushings from right lower lobe pulmonary nodule 2. Transbronchial Wang needle biopsies from right lower lobe pulmonary nodule 3. Transbronchial forceps biopsies from right lower lobe  pulmonary nodule  EBUS samples: 1. Wang needle biopsies from 4L node 2. Wang needle biopsies from 4R node 3. Wang needle biopsies from  7 node  Plans:  The patient will be discharged from the PACU to home when recovered from anesthesia and after chest x-ray is reviewed. We will review the cytology, pathology and microbiology results with the patient when they become available. Outpatient followup will be with Dr. Delton Coombes and Dr. Arbutus Ped.    Levy Pupa, MD, PhD 04/04/2023, 3:19 PM [facility] [phone] or if no answer before 7:00PM call [phone] For any issues after 7:00PM please call eLink [phone]

## 2023-04-04 NOTE — Anesthesia Procedure Notes (Signed)
Procedure Name: Intubation Date/Time: 04/04/2023 1:46 PM  Performed by: Gus Puma, CRNAPre-anesthesia Checklist: Patient identified, Emergency Drugs available, Suction available and Patient being monitored Patient Re-evaluated:Patient Re-evaluated prior to induction Oxygen Delivery Method: Circle System Utilized Preoxygenation: Pre-oxygenation with 100% oxygen Induction Type: IV induction Ventilation: Mask ventilation without difficulty Laryngoscope Size: Mac and 3 Grade View: Grade II Tube type: Oral Tube size: 8.5 mm Number of attempts: 1 Airway Equipment and Method: Stylet and Oral airway Placement Confirmation: ETT inserted through vocal cords under direct vision, positive ETCO2 and breath sounds checked- equal and bilateral Secured at: 23 cm Tube secured with: Tape Dental Injury: Teeth and Oropharynx as per pre-operative assessment

## 2023-04-04 NOTE — Discharge Instructions (Addendum)
Flexible Bronchoscopy, Care After This sheet gives you information about how to care for yourself after your test. Your doctor may also give you more specific instructions. If you have problems or questions, contact your doctor. Follow these instructions at home: Eating and drinking When your numbness is gone and your cough and gag reflexes have come back, you may: Eat only soft foods. Slowly drink liquids. The day after the test, go back to your normal diet. Driving Do not drive for 24 hours if you were given a medicine to help you relax (sedative). Do not drive or use heavy machinery while taking prescription pain medicine. General instructions  Take over-the-counter and prescription medicines only as told by your doctor. Return to your normal activities as told. Ask what activities are safe for you. Do not use any products that have nicotine or tobacco in them. This includes cigarettes and e-cigarettes. If you need help quitting, ask your doctor. Keep all follow-up visits as told by your doctor. This is important. It is very important if you had a tissue sample (biopsy) taken. Get help right away if: You have shortness of breath that gets worse. You get light-headed. You feel like you are going to pass out (faint). You have chest pain. You cough up: More than a little blood. More blood than before. Summary Do not eat or drink anything (not even water) for 2 hours after your test, or until your numbing medicine wears off. Do not use cigarettes. Do not use e-cigarettes. Get help right away if you have chest pain.  Please call our office for any questions or concerns.  [phone].  This information is not intended to replace advice given to you by your health care provider. Make sure you discuss any questions you have with your health care provider. Document Released: 02/14/2009 Document Revised: 04/01/2017 Document Reviewed: 05/07/2016 Elsevier Patient Education  2020 Elsevier  Inc.  

## 2023-04-04 NOTE — Anesthesia Postprocedure Evaluation (Signed)
Anesthesia Post Note  Patient: Amy Sandoval  Procedure(s) Performed: ROBOTIC ASSISTED NAVIGATIONAL BRONCHOSCOPY (Right) ENDOBRONCHIAL ULTRASOUND WITH FLURO (Right) BRONCHIAL NEEDLE ASPIRATION BIOPSIES BRONCHIAL BIOPSIES BRONCHIAL BRUSHINGS FIDUCIAL MARKER PLACEMENT     Patient location during evaluation: PACU Anesthesia Type: General Level of consciousness: awake and alert and oriented Pain management: pain level controlled Vital Signs Assessment: post-procedure vital signs reviewed and stable Respiratory status: spontaneous breathing, nonlabored ventilation, respiratory function stable and patient connected to nasal cannula oxygen Cardiovascular status: blood pressure returned to baseline and stable Postop Assessment: no apparent nausea or vomiting Anesthetic complications: no   No notable events documented.  Last Vitals:  Vitals:   04/04/23 1550 04/04/23 1600  BP: 98/79 113/62  Pulse: 73 73  Resp: 12 15  Temp:    SpO2: 98% 98%    Last Pain:  Vitals:   04/04/23 1600  TempSrc:   PainSc: 0-No pain                 Blessing Ozga A.

## 2023-04-04 NOTE — Anesthesia Preprocedure Evaluation (Addendum)
Anesthesia Evaluation  Patient identified by MRN, date of birth, ID band Patient awake    Reviewed: Allergy & Precautions, NPO status , Patient's Chart, lab work & pertinent test results  Airway Mallampati: III  TM Distance: >3 FB     Dental  (+) Dental Advisory Given, Teeth Intact, Caps   Pulmonary pneumonia, resolved, COPD,  COPD inhaler and oxygen dependent, former smoker RLL pulmonary nodule Mediastinal adenopathy Hx/o small cell lung Ca S/P RT On O2 @ 3L [place] 24/7   breath sounds clear to auscultation + decreased breath sounds      Cardiovascular negative cardio ROS Normal cardiovascular exam Rhythm:Regular Rate:Normal  Echo 12/19/20  1. Left ventricular ejection fraction, by estimation, is 60 to 65%. The  left ventricle has normal function. The left ventricle has no regional  wall motion abnormalities. Left ventricular diastolic parameters are  consistent with Grade II diastolic  dysfunction (pseudonormalization).   2. Right ventricular systolic function is normal. The right ventricular  size is moderately enlarged. D-shaped interventricular septum suggesting  RV pressure/volume overload. There is mildly elevated pulmonary artery  systolic pressure. The estimated  right ventricular systolic pressure is 39.5 mmHg.   3. Right atrial size was mildly dilated.   4. The mitral valve is normal in structure. No evidence of mitral valve  regurgitation. No evidence of mitral stenosis.   5. The aortic valve is tricuspid. Aortic valve regurgitation is not  visualized. Mild aortic valve sclerosis is present, with no evidence of  aortic valve stenosis.   6. The inferior vena cava is normal in size with greater than 50%  respiratory variability, suggesting right atrial pressure of 3 mmHg.     Neuro/Psych  negative psych ROS   GI/Hepatic negative GI ROS,,,  Endo/Other    Renal/GU      Musculoskeletal  (+) Arthritis ,  Osteoarthritis,    Abdominal   Peds  Hematology   Anesthesia Other Findings   Reproductive/Obstetrics                             Anesthesia Physical Anesthesia Plan  ASA: 3  Anesthesia Plan: General   Post-op Pain Management: Minimal or no pain anticipated   Induction: Intravenous  PONV Risk Score and Plan: 4 or greater and Treatment may vary due to age or medical condition, TIVA and Propofol infusion  Airway Management Planned: Oral ETT  Additional Equipment: None  Intra-op Plan:   Post-operative Plan: Extubation in OR  Informed Consent: I have reviewed the patients History and Physical, chart, labs and discussed the procedure including the risks, benefits and alternatives for the proposed anesthesia with the patient or authorized representative who has indicated his/her understanding and acceptance.     Dental advisory given  Plan Discussed with: CRNA and Anesthesiologist  Anesthesia Plan Comments:         Anesthesia Quick Evaluation

## 2023-04-07 ENCOUNTER — Ambulatory Visit (INDEPENDENT_AMBULATORY_CARE_PROVIDER_SITE_OTHER): Payer: Medicare Other | Admitting: Otolaryngology

## 2023-04-07 ENCOUNTER — Encounter (INDEPENDENT_AMBULATORY_CARE_PROVIDER_SITE_OTHER): Payer: Self-pay

## 2023-04-07 VITALS — Ht 64.0 in | Wt 137.0 lb

## 2023-04-07 DIAGNOSIS — J3489 Other specified disorders of nose and nasal sinuses: Secondary | ICD-10-CM

## 2023-04-07 DIAGNOSIS — R0982 Postnasal drip: Secondary | ICD-10-CM

## 2023-04-07 DIAGNOSIS — F17200 Nicotine dependence, unspecified, uncomplicated: Secondary | ICD-10-CM

## 2023-04-07 LAB — CYTOLOGY - NON PAP

## 2023-04-07 MED ORDER — IPRATROPIUM BROMIDE 0.06 % NA SOLN: 2.0000 | Freq: Two times a day (BID) | NASAL | 5 refills | Status: AC | PRN

## 2023-04-07 NOTE — Progress Notes (Signed)
Dear Dr. Mayford Knife, Here is my assessment for our mutual patient, Amy Sandoval. Thank you for allowing me the opportunity to care for your patient. Please do not hesitate to contact me should you have any other questions. Sincerely, Dr. Jovita Kussmaul  Otolaryngology Clinic Note  HISTORY: Amy Sandoval is a 88 y.o. female kindly referred by Dr. Mayford Knife for evaluation of rhinorrhea Initial visit (04/2023): She reports that her problems started months ago. She reports having right sided but now has bilateral clear rhinorrhea (still mostly right). Thin mucoid, no salty/metallic taste. No other associated symptoms. Only happens when she stands up, otherwise not associated with straining or any valsalva maneuver. No dripping when she leans forward. No headahces. No other nasal symptoms -- congestion, sneezing, PND, facial pain/pressure, headache. No other triggers like smells, pressure changes, eating. No significant or typical AR symptoms. No head injuries. Prior sinus infections (headache, facial tenderness) but not in a while. She has been on supplemental O2 for 2 years but did not start immediately after. Symptoms began 6 months ago.  Symptom severity is moderate. She has tried astelin spray -- does help some. Allergy testing has not been done.   No previous sinonasal surgery.  She is currently using astelin spray. No other spray use.  AP/AC: no  PMHx: Stage IV Non-small Cell Lung Cancer (T2B, N0 M1) s/p radiation, COPD, Hypothyroidism Tobacco: "most of her life" -  quit for last few months  RADIOGRAPHIC EVALUATION AND INDEPENDENT REVIEW OF OTHER RECORDS: CT Face (2023): no obvious large dehiscence over skull base (perhaps tiny slice on sagittal over ethmoid region).  PET/CT (2024): No sinonasal opacification; unable to view coronal or sagittal thin cuts Dr. Mayford Knife notes (02/2023): reports of right nostril clear dripping, when standing up; ongoing for few months, no other symptoms; No h/o  head/sinus surgery or procedures; wanted to rule out CSF leak, referred to ENT; Rx: astelin for vasomotor rhinitis. CBC w/diff 02/2023: no leukocytosis, Plt 182; Eos 100  Past Medical History:  Diagnosis Date   Arthritis    COPD (chronic obstructive pulmonary disease) (HCC)    COPD with chronic hypoxic respiratory failure (HCC) 08/17/2022   First diagnosis 12/2020 upon admission for multifocal pneumonia   [place] [facility]  PFTs 2022 confirmed obstruction and mild reduction in DLCO     History of radiation therapy    RUL lung and LLL lung 06/15/2021-06/24/2021  Dr Antony Blackbird   Hx of cervical spine fracture 2/2 fall managed with neck brace 08/20/2022   Hx of herpes zoster infection of R thoracic region; no neuropathic pain    Non-small cell lung cancer (NSCLC) (HCC) 12/2020   Pneumonia    Rhinorrhea, chronic right unilateral 02/17/2023   Sepsis (HCC) 12/18/2020   Past Surgical History:  Procedure Laterality Date   BRONCHIAL BIOPSY  03/09/2021   Procedure: BRONCHIAL BIOPSIES;  Surgeon: Leslye Peer, MD;  Location: [facility] ENDOSCOPY;  Service: Pulmonary;;   BRONCHIAL BIOPSY  04/04/2023   Procedure: BRONCHIAL BIOPSIES;  Surgeon: Leslye Peer, MD;  Location: [facility] ENDOSCOPY;  Service: Pulmonary;;   BRONCHIAL BRUSHINGS  03/09/2021   Procedure: BRONCHIAL BRUSHINGS;  Surgeon: Leslye Peer, MD;  Location: [facility] ENDOSCOPY;  Service: Pulmonary;;   BRONCHIAL BRUSHINGS  04/04/2023   Procedure: BRONCHIAL BRUSHINGS;  Surgeon: Leslye Peer, MD;  Location: [facility] ENDOSCOPY;  Service: Pulmonary;;   BRONCHIAL NEEDLE ASPIRATION BIOPSY  03/09/2021   Procedure: BRONCHIAL NEEDLE ASPIRATION BIOPSIES;  Surgeon: Leslye Peer, MD;  Location: [facility] ENDOSCOPY;  Service: Pulmonary;;  BRONCHIAL NEEDLE ASPIRATION BIOPSY  04/04/2023   Procedure: BRONCHIAL NEEDLE ASPIRATION BIOPSIES;  Surgeon: Leslye Peer, MD;  Location: [facility] ENDOSCOPY;  Service: Pulmonary;;   DILATION AND CURETTAGE OF UTERUS     ENDOBRONCHIAL ULTRASOUND  Right 04/04/2023   Procedure: ENDOBRONCHIAL ULTRASOUND WITH FLURO;  Surgeon: Leslye Peer, MD;  Location: [facility] ENDOSCOPY;  Service: Pulmonary;  Laterality: Right;   EYE SURGERY     bilateral cataract removal   FIDUCIAL MARKER PLACEMENT  04/04/2023   Procedure: FIDUCIAL MARKER PLACEMENT;  Surgeon: Leslye Peer, MD;  Location: [facility] ENDOSCOPY;  Service: Pulmonary;;   TONSILLECTOMY     VIDEO BRONCHOSCOPY WITH ENDOBRONCHIAL NAVIGATION Bilateral 03/09/2021   Procedure: VIDEO BRONCHOSCOPY WITH ENDOBRONCHIAL NAVIGATION;  Surgeon: Leslye Peer, MD;  Location: [facility] ENDOSCOPY;  Service: Pulmonary;  Laterality: Bilateral;  ION w/ possible fiducial   Family History  Problem Relation Age of Onset   Hypertension Other    Social History   Tobacco Use   Smoking status: Former    Current packs/day: 1.00    Average packs/day: 1 pack/day for 3.0 years (3.0 ttl pk-yrs)    Types: Cigarettes    Start date: 2022    Passive exposure: Past   Smokeless tobacco: Never  Substance Use Topics   Alcohol use: Yes    Comment: Social   Allergies  Allergen Reactions   Penicillins Rash   Current Outpatient Medications  Medication Sig Dispense Refill   ipratropium (ATROVENT) 0.06 % nasal spray Place 2 sprays into both nostrils 2 (two) times daily as needed for rhinitis. 15 mL 5   levothyroxine (SYNTHROID) 25 MCG tablet Take 1 tablet (25 mcg total) by mouth daily before breakfast. 30 tablet 5   naproxen sodium (ALEVE) 220 MG tablet Take 220 mg by mouth as needed. (Patient not taking: Reported on 04/22/2023)     OXYGEN Inhale 3 L into the lungs as needed. When 02 sats drop below 92     prochlorperazine (COMPAZINE) 10 MG tablet Take 1 tablet (10 mg total) by mouth every 6 (six) hours as needed. 30 tablet 2   No current facility-administered medications for this visit.   Ht 5\' 4"  (1.626 m)   Wt 137 lb (62.1 kg)   LMP  (LMP Unknown)   BMI 23.52 kg/m   PHYSICAL EXAM:  Ht 5\' 4"  (1.626 m)   Wt 137 lb (62.1 kg)    LMP  (LMP Unknown)   BMI 23.52 kg/m    Salient findings:  CN II-XII intact  Bilateral EAC clear and TM intact with well pneumatized middle ear spaces Nose: Anterior rhinoscopy reveals mild septal deviation and bilateral inferior tubinate minimal hypertrophy.  Nasal endoscopy was indicated to better evaluate the nose and paranasal sinuses, given the patient's history and exam findings, and is detailed below. No lesions of oral cavity/oropharynx No obviously palpable neck masses/lymphadenopathy/thyromegaly No respiratory distress or stridor Unable to elicit dripping on head tilt   PROCEDURE: Diagnostic Nasal Endoscopy Pre-procedure diagnosis: Concern for CSF rhinorrhea, rule out structural lesion Post-procedure diagnosis: same Indication: See pre-procedure diagnosis and physical exam above Complications: None apparent EBL: 0 mL Anesthesia: Lidocaine 4% and topical decongestant was topically sprayed in each nasal cavity  Description of Procedure:  Patient was identified. A rigid 30 degree endoscope was utilized to evaluate the sinonasal cavities, mucosa, sinus ostia and turbinates and septum.  Overall, signs of mucosal inflammation are not noted.  Also noted are visualized olfactory cleft without masses or lesions; no evidence of  active rhinorrhea.  No mucopurulence, polyps, or masses noted.   Right Middle meatus: clear Right SE Recess: clear Left MM: clear Left SE Recess: clear  Photodocumentation was obtained. CPT CODE -- 11914 - Mod 25   ASSESSMENT:  Ms. Godoy is a 88 y.o. F with lung cancer now with:  1. Rhinorrhea   2. Post-nasal drip    She is having mostly rhinorrhea only when standing up; no typical vasomotor symptoms, and otherwise not a significant problems. Minimal PND; Endo is reassuring today, and prior CT review, though not comprehensive, does not appear to show clear dehiscence (sagittal query small dehiscence may just be artifact). Regardless, given regularity,  will have her collect this and send off for B2; in interim, will add ipratropium as well.  PLAN: We've discussed issues and options today.  We reviewed the nasal endoscopy images together.  The risks, benefits and alternatives were discussed and questions answered.  She has elected to proceed with:  1) Continue astelin BID 2) Start ipratropium 0.06% PRN for dripping 3) Given container; collect fluid and instructions given for B2 testing 2) Follow-up in 1 year -- if able to collect prior to this, please send off and depending on results will re-eval  -- sooner as necessary.  See below regarding exact medications prescribed this encounter including dosages and route: Meds ordered this encounter  Medications   ipratropium (ATROVENT) 0.06 % nasal spray    Sig: Place 2 sprays into both nostrils 2 (two) times daily as needed for rhinitis.    Dispense:  15 mL    Refill:  5     Thank you for allowing me the opportunity to care for your patient. Please do not hesitate to contact me should you have any other questions.  Sincerely, Jovita Kussmaul, MD Otolarynoglogist (ENT), [facility] Phone: [phone] Fax: [phone]  MDM:  Level 4: [phone] Complexity/Problems addressed: mo Data complexity: mod - independent interpretation of imaging; review of notes, and labs - Morbidity: mod  - Prescription Drug prescribed or managed: yes  04/23/2023, 10:29 AM

## 2023-04-12 ENCOUNTER — Encounter: Payer: Self-pay | Admitting: Acute Care

## 2023-04-12 ENCOUNTER — Ambulatory Visit: Payer: Medicare Other | Admitting: Acute Care

## 2023-04-12 VITALS — BP 140/80 | HR 89 | Ht 64.0 in | Wt 140.6 lb

## 2023-04-12 DIAGNOSIS — J449 Chronic obstructive pulmonary disease, unspecified: Secondary | ICD-10-CM | POA: Diagnosis not present

## 2023-04-12 DIAGNOSIS — Z87891 Personal history of nicotine dependence: Secondary | ICD-10-CM | POA: Diagnosis not present

## 2023-04-12 DIAGNOSIS — C3490 Malignant neoplasm of unspecified part of unspecified bronchus or lung: Secondary | ICD-10-CM

## 2023-04-12 DIAGNOSIS — Z9889 Other specified postprocedural states: Secondary | ICD-10-CM

## 2023-04-12 DIAGNOSIS — C3431 Malignant neoplasm of lower lobe, right bronchus or lung: Secondary | ICD-10-CM | POA: Diagnosis not present

## 2023-04-12 NOTE — Progress Notes (Signed)
History of Present Illness Amy Sandoval is a 88 y.o. female with a history of tobacco use and COPD, stage IV non-small cell lung cancer that has been treated with by SBRT to a right upper lobe nodule. She is followed by Dr. Delton Coombes.  Synopsis 88 year old woman with a history of tobacco use and COPD, Stage IV (T2b, N0, M1 a) non-small cell lung cancer with unclear subtype on the pathology report diagnosed in November 2022 that has been treated with SBRT to  right upper lobe nodule, left lower lobe nodule, last in 06/2021.  She is under surveillance and chest imaging has revealed a new right lower lobe nodule as well as subcarinal adenopathy.  These were hypermetabolic on PET  Scan that was done 01/27/2023. She was referred to see Dr. Delton Coombes by Dr. Arbutus Ped for consideration of robotic assisted bronchoscopy with biopsies.She spoke with Dr. Delton Coombes by phone 03/14/2023 . He had reviewed her CT and PET imaging, and recommended repeat navigational bronchoscopy with EBUS. She agreed, and was scheduled for robotic assisted bronchoscopy with biopsies and EBUS 04/04/2023. She is here today to ensure she has done well post procedure and to review cytology results.   04/12/2023 Pt. Presents for follow up after robotic assisted bronchoscopy with biopsies on 04/04/2023. She denies any complications such as bleeding, infection, discolored secretions, fever, or worsening shortness of breath.She reports no issues with anesthesia during the procedure.  We have discussed her cytology results.  They were positive for squamous cell carcinoma in the right lower lobe brushing, right lower lobe fine-needle aspiration, 4R and station 7 lymph nodes.  The 4L lymph node was negative for malignant cells.  Patient is followed by Dr. Arbutus Ped.  I have contacted his nurse navigator Alejandro Mulling and she will arrange for the patient to have follow-up with Dr. Arbutus Ped.  He will discuss treatment options with the patient.  Initially during  the visit today her oxygen saturations on her portable oxygen device at 3 L were read below 88%.  She did not clinically look hypoxic.  We switched her to the office continuous flow oxygen at 4 L, and we rechecked her oxygen saturations with a head probe and her saturations were 100%.  She states she has not had issues with oxygenation at home.  Plan will be to follow-up with Dr. 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  Patient verbalized understanding and she is in agreement with this plan.  She has a good and well established treatment relationship with Dr. Arbutus Ped and she trusts him, and his team.  She does endorse that it is harder to get out and about due to fatigue.  Test Results: PET Scan 01/27/2023 IMPRESSION: Radiation changes in the right lung apex and left lower lobe, without underlying hypermetabolism.   9 mm nodule at the right lung base, poorly visualized due to motion degradation, suspicious for metastasis versus a metachronous primary.   10 mm subcarinal node, suspicious for nodal recurrence.  CT Chest 01/11/2023 Decreased size of left lower and right apical pulmonary nodules. 2. Enlarging right lower lobe pulmonary nodule, highly suspicious for metachronous primary or metastatic disease. 3. Developing thoracic adenopathy, highly suspicious for nodal metastasis. 4. Similar right upper lobe pulmonary nodule. 5. Interval right fourth rib fracture. Healing of eighth left rib fracture. These are likely due to insufficiency from radiation necrosis. 6. Subtle irregular hepatic capsule. Correlate with risk factors  for cirrhosis.  Cytology 04/04/2023 FINAL MICROSCOPIC DIAGNOSIS:  A. LUNG, RLL, FINE NEEDLE ASPIRATION  BIOPSY:  -  Squamous cell carcinoma.   B. LUNG, RLL, BRUSHING:  - Squamous cell carcinoma.   C. LYMPH NODE, STATION 4L, FINE NEEDLE ASPIRATION:  - No malignant  cells identified  - Lymphoid tissue present   D. LYMPH NODE, STATION 4R, FINE NEEDLE ASPIRATION:  - Squamous cell carcinoma.   E. LYMPH NODE, STATION 7, FINE NEEDLE ASPIRATION:  - Squamous cell carcinoma.      Latest Ref Rng & Units 04/04/2023   11:44 AM 03/01/2023    2:42 PM 01/18/2023    2:56 PM  CBC  WBC 4.0 - 10.5 K/uL 7.4  6.5  7.2   Hemoglobin 12.0 - 15.0 g/dL 62.9  52.8  41.3   Hematocrit 36.0 - 46.0 % 38.8  39.2  36.9   Platelets 150 - 400 K/uL 207  182  194        Latest Ref Rng & Units 04/04/2023   11:44 AM 03/01/2023    2:42 PM 01/18/2023    2:56 PM  BMP  Glucose 70 - 99 mg/dL 86  244  010   BUN 8 - 23 mg/dL 12  13  13    Creatinine 0.44 - 1.00 mg/dL 2.72  5.36  6.44   Sodium 135 - 145 mmol/L 139  141  143   Potassium 3.5 - 5.1 mmol/L 4.9  5.3  4.8   Chloride 98 - 111 mmol/L 100  99  100   CO2 22 - 32 mmol/L 28  35  38   Calcium 8.9 - 10.3 mg/dL 9.2  03.4  9.5     BNP    Component Value Date/Time   BNP 268.8 (H) 12/19/2020 1848    ProBNP No results found for: "PROBNP"  PFT    Component Value Date/Time   FEV1PRE 1.09 01/26/2021 1552   FEV1POST 1.07 01/26/2021 1552   FVCPRE 1.96 01/26/2021 1552   FVCPOST 1.93 01/26/2021 1552   TLC 4.27 01/26/2021 1552   DLCOUNC 15.04 01/26/2021 1552   PREFEV1FVCRT 55 01/26/2021 1552   PSTFEV1FVCRT 55 01/26/2021 1552    CT Super D Chest Wo Contrast  Result Date: 04/11/2023 CLINICAL DATA:  Non-small cell lung cancer. Restaging. * Tracking Code: BO * EXAM: CT CHEST WITHOUT CONTRAST TECHNIQUE: Multidetector CT imaging of the chest was performed using thin slice collimation for electromagnetic bronchoscopy planning purposes, without intravenous contrast. RADIATION DOSE REDUCTION: This exam was performed according to the departmental dose-optimization program which includes automated exposure control, adjustment of the mA and/or kV according to patient size and/or use of iterative reconstruction technique. COMPARISON:   01/27/2023 PET-CT.  01/11/2023 chest CT. FINDINGS: Cardiovascular: Normal heart size. No significant pericardial effusion/thickening. Left anterior descending coronary atherosclerosis. Atherosclerotic nonaneurysmal thoracic aorta. Normal caliber pulmonary arteries. Mediastinum/Nodes: No significant thyroid nodules. Unremarkable esophagus. No axillary adenopathy. Mildly enlarged 1.0 cm subcarinal node (series 4/image 158), not appreciably changed using similar measurement technique. No additional pathologically enlarged mediastinal nodes. No discrete hilar adenopathy on these noncontrast images. Lungs/Pleura: No pneumothorax. No pleural effusion. Irregular solid 1.3 x 1.2 cm apical right upper lobe pulmonary nodule (series 3/image 14), previously 1.3 x 1.3 cm using similar measurement technique, not appreciably changed. Separate posterior apical right upper lobe 1.1 x 0.8 cm solid pulmonary nodule (series 3/image 23), previously 1.1 x 0.8 cm using similar measurement technique, stable. Irregular mixed cystic and solid 1.3 x 0.9 cm anterior right lower lobe  pulmonary nodule (series 3/image 107, mildly increased from 1.1 x 0.7 cm. Solid 1.3 x 1.2 cm left lower lobe pulmonary nodule (series 3/image 78), previously 1.4 x 1.2 cm using similar measurement technique, stable. Sharply marginated mild bandlike surrounding consolidation in the left lower lobe is compatible with post radiation change. No acute consolidative airspace disease or new significant pulmonary nodules. Upper abdomen: Small hiatal hernia. Simple 1.7 cm upper left renal cyst, for which no follow-up imaging is recommended. Musculoskeletal: No aggressive appearing focal osseous lesions. Mild thoracic spondylosis. IMPRESSION: 1. Irregular mixed cystic and solid 1.3 cm anterior right lower lobe pulmonary nodule, mildly increased in size since 01/11/2023 chest CT, highly suspicious for malignancy, either a metachronous primary bronchogenic malignancy or  metastasis. 2. Additional bilateral solid pulmonary nodules are stable. 3. Stable mildly enlarged subcarinal lymph node, indeterminate for nodal metastasis. 4. One vessel coronary atherosclerosis. 5. Small hiatal hernia. 6.  Aortic Atherosclerosis (ICD10-I70.0). Electronically Signed   By: Delbert Phenix M.D.   On: 04/11/2023 20:08   DG Chest Port 1 View  Result Date: 04/04/2023 CLINICAL DATA:  Post bronchoscopy with biopsy EXAM: PORTABLE CHEST 1 VIEW COMPARISON:  03/28/2023 FINDINGS: Cardiac enlargement. Low lung volumes with diffuse coarse interstitial fibrosis, likely representing chronic interstitial lung disease. Surgical clip in the right lower lung nodule seen at CT are not well demonstrated radiographically. No pleural effusions. No pneumothorax. Calcification of the aorta. Degenerative changes in the spine and shoulders. IMPRESSION: Coarse interstitial changes in the lungs likely representing chronic interstitial lung disease. Surgical clip in the right lower chest. No pneumothorax. Electronically Signed   By: Burman Nieves M.D.   On: 04/04/2023 19:34   DG C-ARM BRONCHOSCOPY  Result Date: 04/04/2023 C-ARM BRONCHOSCOPY: Fluoroscopy was utilized by the requesting physician.  No radiographic interpretation.     Past medical hx Past Medical History:  Diagnosis Date   Arthritis    COPD (chronic obstructive pulmonary disease) (HCC)    COPD with chronic hypoxic respiratory failure (HCC) 08/17/2022   First diagnosis 12/2020 upon admission for multifocal pneumonia   [facility] pulmonary  PFTs 2022 confirmed obstruction and mild reduction in DLCO     History of radiation therapy    RUL lung and LLL lung 06/15/2021-06/24/2021  Dr Antony Blackbird   Hx of cervical spine fracture 2/2 fall managed with neck brace 08/20/2022   Hx of herpes zoster infection of R thoracic region; no neuropathic pain    Non-small cell lung cancer (NSCLC) (HCC) 12/2020   Pneumonia    Rhinorrhea, chronic right unilateral  02/17/2023   Sepsis (HCC) 12/18/2020     Social History   Tobacco Use   Smoking status: Former    Current packs/day: 1.00    Average packs/day: 1 pack/day for 2.9 years (2.9 ttl pk-yrs)    Types: Cigarettes    Start date: 2022    Passive exposure: Past   Smokeless tobacco: Never  Vaping Use   Vaping status: Former   Substances: Nicotine, Flavoring  Substance Use Topics   Alcohol use: Yes    Comment: Social   Drug use: Never    Ms.Poteete reports that she has quit smoking. Her smoking use included cigarettes. She started smoking about 2 years ago. She has a 2.9 pack-year smoking history. She has been exposed to tobacco smoke. She has never used smokeless tobacco. She reports current alcohol use. She reports that she does not use drugs.  Tobacco Cessation: Former smoker quit in 2022   Past surgical hx, Family  hx, Social hx all reviewed.  Current Outpatient Medications on File Prior to Visit  Medication Sig   azelastine (ASTELIN) 0.1 % nasal spray 2 sprays in right nostril twice a day   ipratropium (ATROVENT) 0.06 % nasal spray Place 2 sprays into both nostrils 2 (two) times daily as needed for rhinitis.   levothyroxine (SYNTHROID) 25 MCG tablet Take 1 tablet (25 mcg total) by mouth daily before breakfast.   naproxen sodium (ALEVE) 220 MG tablet Take 220 mg by mouth as needed.   OXYGEN Inhale 3 L into the lungs as needed. When 02 sats drop below 92   No current facility-administered medications on file prior to visit.     Allergies  Allergen Reactions   Penicillins Rash    Review Of Systems:  Constitutional:   No  weight loss, night sweats,  Fevers, chills, fatigue, or  lassitude.  HEENT:   No headaches,  Difficulty swallowing,  Tooth/dental problems, or  Sore throat,                No sneezing, itching, ear ache, nasal congestion, post nasal drip,   CV:  No chest pain,  Orthopnea, PND, swelling in lower extremities, anasarca, dizziness, palpitations, syncope.    GI  No heartburn, indigestion, abdominal pain, nausea, vomiting, diarrhea, change in bowel habits, loss of appetite, bloody stools.   Resp: + shortness of breath with exertion less at rest.  No excess mucus, no productive cough,  No non-productive cough,  No coughing up of blood.  No change in color of mucus.  No wheezing.  No chest wall deformity  Skin: no rash or lesions.  GU: no dysuria, change in color of urine, no urgency or frequency.  No flank pain, no hematuria   MS:  No joint pain or swelling.  + decreased range of motion.  No back pain.  Psych:  No change in mood or affect. No depression or anxiety.  No memory loss.   Vital Signs BP (!) 140/80 (BP Location: Left Arm)   Pulse 89   Ht 5\' 4"  (1.626 m)   Wt 140 lb 9.6 oz (63.8 kg)   LMP  (LMP Unknown)   BMI 24.13 kg/m    Physical Exam:  General- No distress,  A&Ox3, pleasant ENT: No sinus tenderness, TM clear, pale nasal mucosa, no oral exudate,no post nasal drip, no LAN Cardiac: S1, S2, regular rate and rhythm, no murmur Chest: No wheeze/ rales/ dullness; no accessory muscle use, no nasal flaring, no sternal retractions, slightly diminished per bases, no nasal flaring, sternal retractions or accessory muscle use noted Abd.: Soft Non-tender, nondistended, bowel sounds positive,Body mass index is 24.13 kg/m.  Ext: No clubbing cyanosis, edema Neuro: Physical deconditioning, moving all extremities x 4, alert and oriented x 3, appropriate Skin: No rashes, warm and dry, no lesions Psych: normal mood and behavior   Assessment/Plan Recurrent squamous cell lung cancer involving right lower lobe, 4R and station 7 lymph nodes Previous history of stage IV (T2b, N0, M1 a) non-small cell lung cancer with unclear subtype on the pathology report diagnosed in November 2022 Former smoker COPD Acute on chronic respiratory failure with hypoxemia Status post SBRT 03/2021 through 06/2021 Plan I will refer you back to Dr. Arbutus Ped to  help manage this.  You will get a call to get this scheduled.  Please wear your oxygen at 3-4 liters to maintain oxygen saturations above 88% at all times.  If you have trouble maintaining saturations greater than 88%  please seek emergency care.  Good luck with your treatment. Call if you need [place] sooner.  Please contact office for sooner follow up if symptoms do not improve or worsen or seek emergency care    I spent 30 minutes dedicated to the care of this patient on the date of this encounter to include pre-visit review of records, face-to-face time with the patient discussing conditions above, post visit ordering of testing, clinical documentation with the electronic health record, making appropriate referrals as documented, and communicating necessary information to the patient's healthcare team.    Bevelyn Ngo, NP 04/12/2023  7:44 PM

## 2023-04-12 NOTE — Patient Instructions (Addendum)
It is good to see you today. Your biopsies were  positive for squamous cell lung cancer . I will refer you back to Dr. Arbutus Ped to help manage this.  You will get a call to get this scheduled.  Please wear your oxygen at 3-4 liters to maintain oxygen saturations above 88% at all times.  If you have trouble maintaining saturations greater than 88% please seek emergency care.  Good luck with your treatment. Call if you need [place] sooner.  Please contact office for sooner follow up if symptoms do not improve or worsen or seek emergency care

## 2023-04-14 ENCOUNTER — Telehealth: Payer: Self-pay | Admitting: Physician Assistant

## 2023-04-14 ENCOUNTER — Other Ambulatory Visit: Payer: Self-pay

## 2023-04-14 NOTE — Telephone Encounter (Signed)
Patient is aware of scheduled appointment times/dates for follow up appointment as requested by the MD on 04/14/2023 per staff message

## 2023-04-15 LAB — CYTOLOGY - NON PAP

## 2023-04-15 NOTE — Progress Notes (Signed)
The proposed treatment discussed in conference is for discussion purpose only and is not a binding recommendation.  The patients have not been physically examined, or presented with their treatment options.  Therefore, final treatment plans cannot be decided.  

## 2023-04-15 NOTE — Progress Notes (Unsigned)
[name] OFFICE PROGRESS NOTE  Amy Aschoff, MD [address] [place] [address]  DIAGNOSIS:  Stage IV (T2b, N0, M1 a) non-small cell lung cancer with unclear subtype on the pathology report diagnosed in November 2022 and presented with right upper lobe lung mass as well as left upper lobe nodule in addition to right lower lobe nodule. In October 2024 she was found to have suspicious metachronous primary with RLL nodule and subcarinal node vs. Metastatic disease which was biopsied and consistent with Squamous cell carcinoma.    DETECTED ALTERATION(S) / BIOMARKER(S)     % CFDNA OR AMPLIFICATION        ASSOCIATED FDA-APPROVED THERAPIES         CLINICAL TRIAL AVAILABILITY [id number] 0.1% NoneNo   PD-L1 expression 1%   PRIOR THERAPY: SBRT to the left and right lung nodules under the care of Dr. Roselind Sandoval completed on June 24, 2021.   CURRENT THERAPY: Concurrent chemoradiation with carboplatin for an AUC of 2 paclitaxel 45 mg/m.  First dose expected on 05/02/2023  INTERVAL HISTORY: Amy Sandoval 88 y.o. female returns to the clinic today for a follow-up visit accompanied by her grand daughter. She is on surveillance for her lung cancer.  When she was seen by Dr. Arbutus Sandoval on 01/18/2023, she had a restaging CT scan that showed enlargement of right lower lobe pulmonary nodule suspicious for metachronous primary or metastatic disease. There was also developing thoracic adenopathy suspicious for nodal metastasis. Dr. Arbutus Sandoval recommended a PET scan. The PET showed a 9 mm nodule in the right lung base which is suspicious for metastasis versus metachronous primary.  There was also a 10 mm subcarinal nodule suspicious for nodal recurrence. The patient was re-referred to pulmonary medicine for biopsy.   She underwent biopsy on 04/04/2023.  The final pathology was consistent with squamous cell carcinoma.  Otherwise, she denies any major changes in her health since she was last  seen.  Denies any fever, chills, night sweats, or unexplained weight loss.  Denies any nausea, vomiting, diarrhea, or constipation.  He has baseline dyspnea on exertion for which she is on 3 L of supplemental oxygen at baseline.  Denies any cough, hemoptysis, or chest pain.  Denies any headache or visual changes.   She is here today for evaluation to discuss the next steps.      MEDICAL HISTORY: Past Medical History:  Diagnosis Date   Arthritis    COPD (chronic obstructive pulmonary disease) (HCC)    COPD with chronic hypoxic respiratory failure (HCC) 08/17/2022   First diagnosis 12/2020 upon admission for multifocal pneumonia   [place] pulmonary  PFTs 2022 confirmed obstruction and mild reduction in DLCO     History of radiation therapy    RUL lung and LLL lung 06/15/2021-06/24/2021  Dr Amy Sandoval   Hx of cervical spine fracture 2/2 fall managed with neck brace 08/20/2022   Hx of herpes zoster infection of R thoracic region; no neuropathic pain    Non-small cell lung cancer (NSCLC) (HCC) 12/2020   Pneumonia    Rhinorrhea, chronic right unilateral 02/17/2023   Sepsis (HCC) 12/18/2020    ALLERGIES:  is allergic to penicillins.  MEDICATIONS:  Current Outpatient Medications  Medication Sig Dispense Refill   ipratropium (ATROVENT) 0.06 % nasal spray Place 2 sprays into both nostrils 2 (two) times daily as needed for rhinitis. 15 mL 5   levothyroxine (SYNTHROID) 25 MCG tablet Take 1 tablet (25 mcg total) by mouth daily before breakfast.  30 tablet 5   naproxen sodium (ALEVE) 220 MG tablet Take 220 mg by mouth as needed.     OXYGEN Inhale 3 L into the lungs as needed. When 02 sats drop below 92     prochlorperazine (COMPAZINE) 10 MG tablet Take 1 tablet (10 mg total) by mouth every 6 (six) hours as needed. 30 tablet 2   azelastine (ASTELIN) 0.1 % nasal spray 2 sprays in right nostril twice a day 30 mL 12   No current facility-administered medications for this visit.    SURGICAL  HISTORY:  Past Surgical History:  Procedure Laterality Date   BRONCHIAL BIOPSY  03/09/2021   Procedure: BRONCHIAL BIOPSIES;  Surgeon: Amy Peer, MD;  Location: [facility] ENDOSCOPY;  Service: Pulmonary;;   BRONCHIAL BIOPSY  04/04/2023   Procedure: BRONCHIAL BIOPSIES;  Surgeon: Amy Peer, MD;  Location: [facility] ENDOSCOPY;  Service: Pulmonary;;   BRONCHIAL BRUSHINGS  03/09/2021   Procedure: BRONCHIAL BRUSHINGS;  Surgeon: Amy Peer, MD;  Location: [facility] ENDOSCOPY;  Service: Pulmonary;;   BRONCHIAL BRUSHINGS  04/04/2023   Procedure: BRONCHIAL BRUSHINGS;  Surgeon: Amy Peer, MD;  Location: [facility] ENDOSCOPY;  Service: Pulmonary;;   BRONCHIAL NEEDLE ASPIRATION BIOPSY  03/09/2021   Procedure: BRONCHIAL NEEDLE ASPIRATION BIOPSIES;  Surgeon: Amy Peer, MD;  Location: [facility] ENDOSCOPY;  Service: Pulmonary;;   BRONCHIAL NEEDLE ASPIRATION BIOPSY  04/04/2023   Procedure: BRONCHIAL NEEDLE ASPIRATION BIOPSIES;  Surgeon: Amy Peer, MD;  Location: [facility] ENDOSCOPY;  Service: Pulmonary;;   DILATION AND CURETTAGE OF UTERUS     ENDOBRONCHIAL ULTRASOUND Right 04/04/2023   Procedure: ENDOBRONCHIAL ULTRASOUND WITH FLURO;  Surgeon: Amy Peer, MD;  Location: [facility] ENDOSCOPY;  Service: Pulmonary;  Laterality: Right;   EYE SURGERY     bilateral cataract removal   FIDUCIAL MARKER PLACEMENT  04/04/2023   Procedure: FIDUCIAL MARKER PLACEMENT;  Surgeon: Amy Peer, MD;  Location: [facility] ENDOSCOPY;  Service: Pulmonary;;   TONSILLECTOMY     VIDEO BRONCHOSCOPY WITH ENDOBRONCHIAL NAVIGATION Bilateral 03/09/2021   Procedure: VIDEO BRONCHOSCOPY WITH ENDOBRONCHIAL NAVIGATION;  Surgeon: Amy Peer, MD;  Location: [facility] ENDOSCOPY;  Service: Pulmonary;  Laterality: Bilateral;  ION w/ possible fiducial    REVIEW OF SYSTEMS:   Constitutional: Positive for stable fatigue. Negative for appetite change, chills,  fever and unexpected weight change.  HENT: Negative for mouth sores, nosebleeds, sore throat and trouble swallowing.    Eyes: Negative for eye problems and icterus.  Respiratory: Positive for baseline dyspnea on exertion. Negative for cough, hemoptysis, and wheezing.   Cardiovascular: Negative for chest pain and leg swelling.  Gastrointestinal: Negative for abdominal pain, constipation, diarrhea, nausea and vomiting.  Genitourinary: Negative for bladder incontinence, difficulty urinating, dysuria, frequency and hematuria.   Musculoskeletal: Negative for back pain, gait problem, neck pain and neck stiffness.  Skin: Negative for itching and rash.  Neurological: Negative for dizziness, extremity weakness, gait problem, headaches, light-headedness and seizures.  Hematological: Negative for adenopathy. Does not bruise/bleed easily.  Psychiatric/Behavioral: Negative for confusion, depression and sleep disturbance. The patient is not nervous/anxious.   PHYSICAL EXAMINATION:  There were no vitals taken for this visit.  ECOG PERFORMANCE STATUS: 1  Physical Exam  Constitutional: Oriented to person, place, and time and well-developed, well-nourished, and in no distress.  HENT:  Head: Normocephalic and atraumatic.  Mouth/Throat: Oropharynx is clear and moist. No oropharyngeal exudate.  Eyes: Conjunctivae are normal. Right eye exhibits no discharge. Left eye exhibits no discharge. No scleral icterus.  Neck: Normal range  of motion. Neck supple.  Cardiovascular: Normal rate, regular rhythm, normal heart sounds and intact distal pulses.   Pulmonary/Chest: Effort normal and breath sounds normal. No respiratory distress. No wheezes. No rales.  Abdominal: Soft. Bowel sounds are normal. Exhibits no distension and no mass. There is no tenderness.  Musculoskeletal: Normal range of motion. Exhibits no edema.  Lymphadenopathy:    No cervical adenopathy.  Neurological: Alert and oriented to person, place, and time. Exhibits muscle wasting. She was examined in the wheelchair.  Skin: Skin is warm and dry. No rash noted. Not  diaphoretic. No erythema. No pallor.  Psychiatric: Mood, memory and judgment normal.  Vitals reviewed.  LABORATORY DATA: Lab Results  Component Value Date   WBC 6.3 04/20/2023   HGB 12.1 04/20/2023   HCT 38.1 04/20/2023   MCV 105.0 (H) 04/20/2023   PLT 180 04/20/2023      Chemistry      Component Value Date/Time   NA 142 04/20/2023 0825   K 5.1 04/20/2023 0825   CL 100 04/20/2023 0825   CO2 38 (H) 04/20/2023 0825   BUN 16 04/20/2023 0825   CREATININE 0.67 04/20/2023 0825      Component Value Date/Time   CALCIUM 9.6 04/20/2023 0825   ALKPHOS 93 04/20/2023 0825   AST 28 04/20/2023 0825   ALT 19 04/20/2023 0825   BILITOT 0.4 04/20/2023 0825       RADIOGRAPHIC STUDIES:  CT Super D Chest Wo Contrast Result Date: 04/11/2023 CLINICAL DATA:  Non-small cell lung cancer. Restaging. * Tracking Code: BO * EXAM: CT CHEST WITHOUT CONTRAST TECHNIQUE: Multidetector CT imaging of the chest was performed using thin slice collimation for electromagnetic bronchoscopy planning purposes, without intravenous contrast. RADIATION DOSE REDUCTION: This exam was performed according to the departmental dose-optimization program which includes automated exposure control, adjustment of the mA and/or kV according to patient size and/or use of iterative reconstruction technique. COMPARISON:  01/27/2023 PET-CT.  01/11/2023 chest CT. FINDINGS: Cardiovascular: Normal heart size. No significant pericardial effusion/thickening. Left anterior descending coronary atherosclerosis. Atherosclerotic nonaneurysmal thoracic aorta. Normal caliber pulmonary arteries. Mediastinum/Nodes: No significant thyroid nodules. Unremarkable esophagus. No axillary adenopathy. Mildly enlarged 1.0 cm subcarinal node (series 4/image 158), not appreciably changed using similar measurement technique. No additional pathologically enlarged mediastinal nodes. No discrete hilar adenopathy on these noncontrast images. Lungs/Pleura: No pneumothorax.  No pleural effusion. Irregular solid 1.3 x 1.2 cm apical right upper lobe pulmonary nodule (series 3/image 14), previously 1.3 x 1.3 cm using similar measurement technique, not appreciably changed. Separate posterior apical right upper lobe 1.1 x 0.8 cm solid pulmonary nodule (series 3/image 23), previously 1.1 x 0.8 cm using similar measurement technique, stable. Irregular mixed cystic and solid 1.3 x 0.9 cm anterior right lower lobe pulmonary nodule (series 3/image 107, mildly increased from 1.1 x 0.7 cm. Solid 1.3 x 1.2 cm left lower lobe pulmonary nodule (series 3/image 78), previously 1.4 x 1.2 cm using similar measurement technique, stable. Sharply marginated mild bandlike surrounding consolidation in the left lower lobe is compatible with post radiation change. No acute consolidative airspace disease or new significant pulmonary nodules. Upper abdomen: Small hiatal hernia. Simple 1.7 cm upper left renal cyst, for which no follow-up imaging is recommended. Musculoskeletal: No aggressive appearing focal osseous lesions. Mild thoracic spondylosis. IMPRESSION: 1. Irregular mixed cystic and solid 1.3 cm anterior right lower lobe pulmonary nodule, mildly increased in size since 01/11/2023 chest CT, highly suspicious for malignancy, either a metachronous primary bronchogenic malignancy or metastasis. 2. Additional bilateral  solid pulmonary nodules are stable. 3. Stable mildly enlarged subcarinal lymph node, indeterminate for nodal metastasis. 4. One vessel coronary atherosclerosis. 5. Small hiatal hernia. 6.  Aortic Atherosclerosis (ICD10-I70.0). Electronically Signed   By: Delbert Phenix M.D.   On: 04/11/2023 20:08   DG Chest Port 1 View Result Date: 04/04/2023 CLINICAL DATA:  Post bronchoscopy with biopsy EXAM: PORTABLE CHEST 1 VIEW COMPARISON:  03/28/2023 FINDINGS: Cardiac enlargement. Low lung volumes with diffuse coarse interstitial fibrosis, likely representing chronic interstitial lung disease. Surgical clip  in the right lower lung nodule seen at CT are not well demonstrated radiographically. No pleural effusions. No pneumothorax. Calcification of the aorta. Degenerative changes in the spine and shoulders. IMPRESSION: Coarse interstitial changes in the lungs likely representing chronic interstitial lung disease. Surgical clip in the right lower chest. No pneumothorax. Electronically Signed   By: Burman Nieves M.D.   On: 04/04/2023 19:34   DG C-ARM BRONCHOSCOPY Result Date: 04/04/2023 C-ARM BRONCHOSCOPY: Fluoroscopy was utilized by the requesting physician.  No radiographic interpretation.     ASSESSMENT/PLAN:  This is a very pleasant 88 year old Caucasian female with stage IV (T2b, N0, M1 a) non-small cell lung cancer.  She was diagnosed in November 2022 and presented with a right upper lobe lung mass and an adjacent nodule in addition to a synchronous primary versus Annye Asa a metastatic nodule in the left lower lobe.  She was diagnosed in November 2022.  She has no actionable mutations.   In October 2024 she was found to have suspicious metachronous primary with RLL nodule and subcarinal node vs. Metastatic disease.    The patient underwent SBRT to these lesions under the care of Dr. Roselind Sandoval. This was completed on 06/24/2021    Patient is currently on observation but had a restaging CT scan that showed enlargement of right lower lobe pulmonary nodule highly suspicious for metachronous primary or metastatic disease. There was also developing thoracic adenopathy highly suspicious for nodal metastasis.   The patient had a PET scan to further evaluate this. The scan showed a 9 mm nodule in the right lung base which is suspicious for metastasis versus metachronous primary.  There is also a 10 mm subcarinal nodule suspicious for nodal recurrence.   She underwent biopsy and this was consistent with squamous cell carcinoma.  The patient was seen with Dr. Arbutus Sandoval today.  We discussed the patient's case at  the multidisciplinary conference last week.  Recommend concurrent chemoradiation with carboplatin for an AUC of 2 and Tapclob Taxol 45 mg/m.  The patient is interested in this option and she is expected to start the first dose of treatment on 05/02/2023.  The adverse side effects of treatment were discussed including but not limited to fatigue, myelosuppression, nausea, vomiting, kidney, liver dysfunction, and risk of infection.  We will arrange for the patient to have a chemo education class prior to starting her first cycle of treatment.  I have placed a referral to radiation oncology.  I sent a prescription for Compazine 10 mg p.o. every 6 hours as needed for nausea and vomiting to her pharmacy.  We will see her back for follow-up visit in 3 weeks for evaluation repeat blood work before undergoing her second week of treatment.  I will arrange for a brain MRI to complete the staging workup.   I will refer her to social work for transportation.   The patient was advised to call immediately if she has any concerning symptoms in the interval. The patient voices understanding of  current disease status and treatment options and is in agreement with the current care plan. All questions were answered. The patient knows to call the clinic with any problems, questions or concerns. We can certainly see the patient much sooner if necessary     Orders Placed This Encounter  Procedures   MR Brain W Wo Contrast    Standing Status:   Future    Expected Date:   04/27/2023    Expiration Date:   04/19/2024    If indicated for the ordered procedure, I authorize the administration of contrast media per Radiology protocol:   Yes    What is the patient's sedation requirement?:   No Sedation    Does the patient have a pacemaker or implanted devices?:   No    Use SRS Protocol?:   No    Preferred imaging location?:   [facility] (table limit - 550 lbs)   CBC with Differential (Cancer Center  Only)    Standing Status:   Future    Expected Date:   05/02/2023    Expiration Date:   05/01/2024   CMP (Cancer Center only)    Standing Status:   Future    Expected Date:   05/02/2023    Expiration Date:   05/01/2024   CBC with Differential (Cancer Center Only)    Standing Status:   Future    Expected Date:   05/09/2023    Expiration Date:   05/08/2024   CMP (Cancer Center only)    Standing Status:   Future    Expected Date:   05/09/2023    Expiration Date:   05/08/2024   CBC with Differential (Cancer Center Only)    Standing Status:   Future    Expected Date:   05/16/2023    Expiration Date:   05/15/2024   CMP (Cancer Center only)    Standing Status:   Future    Expected Date:   05/16/2023    Expiration Date:   05/15/2024   CBC with Differential (Cancer Center Only)    Standing Status:   Future    Expected Date:   05/23/2023    Expiration Date:   05/22/2024   CMP (Cancer Center only)    Standing Status:   Future    Expected Date:   05/23/2023    Expiration Date:   05/22/2024   CBC with Differential (Cancer Center Only)    Standing Status:   Future    Expected Date:   05/30/2023    Expiration Date:   05/29/2024   CMP (Cancer Center only)    Standing Status:   Future    Expected Date:   05/30/2023    Expiration Date:   05/29/2024   CBC with Differential (Cancer Center Only)    Standing Status:   Future    Expected Date:   06/06/2023    Expiration Date:   06/05/2024   CMP (Cancer Center only)    Standing Status:   Future    Expected Date:   06/06/2023    Expiration Date:   06/05/2024   CBC with Differential (Cancer Center Only)    Standing Status:   Future    Expected Date:   06/13/2023    Expiration Date:   06/12/2024   CMP (Cancer Center only)    Standing Status:   Future    Expected Date:   06/13/2023    Expiration Date:   06/12/2024   Ambulatory referral to Radiation Oncology  Referral Priority:   Routine    Referral Type:   Consultation    Referral Reason:   Specialty Services  Required    Requested Specialty:   Radiation Oncology    Number of Visits Requested:   1   Ambulatory referral to Social Work    Referral Priority:   Routine    Referral Type:   Consultation    Referral Reason:   Specialty Services Required    Number of Visits Requested:   1     Karmelo Bass L Samari Bittinger, PA-C 04/20/23  ADDENDUM: Hematology/Oncology Attending: I had a face-to-face encounter with the patient today.  I reviewed her records, lab, scan as well as the recent pathology report and recommended her care plan.  This is a very pleasant 88 years old white female with stage IV (T2b, N2, M1 A non-small cell lung cancer that was initially diagnosed in November 2022 status post SBRT to left and right lung nodules in February 2023 and has been in observation since that time until recently she was found to have evidence for disease progression with metachronous primary with right lower lobe nodule in addition to mediastinal lymphadenopathy.  The patient had biopsy of the mediastinal lymph node and they were consistent with a squamous cell carcinoma of the lung.  She is here today for evaluation and recommendation regarding treatment of her condition.  The patient continues to do well except for the baseline shortness of breath and she is on home oxygen.  She is not a good candidate for any surgical resection.  She is also at high risk for complication from radiation but I had a lengthy discussion with the patient and her granddaughter Joice Lofts today about her current condition and treatment options.  I offered the patient a course of concurrent chemoradiation with weekly carboplatin for AUC of 2 and paclitaxel 45 Mg/M2 for 6-7 weeks followed by consolidation immunotherapy if she has no evidence for disease progression.  I discussed with the patient the adverse effect of the chemotherapy including but not limited to alopecia, myelosuppression, nausea and vomiting, peripheral neuropathy, liver or renal  dysfunction.  The patient is interested in the treatment and she is expected to start the first dose of this treatment on May 02, 2023. She will come back for follow-up visit at that time. She was advised to call immediately if she has any other concerning symptoms in the interval. The total time spent in the appointment was 30 minutes. Disclaimer: This note was dictated with voice recognition software. Similar sounding words can inadvertently be transcribed and may be missed upon review. Lajuana Matte, MD

## 2023-04-18 NOTE — Progress Notes (Signed)
Request for pt's tissue from 12/2 biopsy be sent for PD-L1 testing sent via email to Lita Mains, [facility] path tech

## 2023-04-19 ENCOUNTER — Other Ambulatory Visit: Payer: Self-pay | Admitting: Physician Assistant

## 2023-04-19 DIAGNOSIS — C3411 Malignant neoplasm of upper lobe, right bronchus or lung: Secondary | ICD-10-CM

## 2023-04-20 ENCOUNTER — Inpatient Hospital Stay: Payer: Medicare Other

## 2023-04-20 ENCOUNTER — Inpatient Hospital Stay (HOSPITAL_BASED_OUTPATIENT_CLINIC_OR_DEPARTMENT_OTHER): Payer: Medicare Other | Admitting: Physician Assistant

## 2023-04-20 VITALS — BP 134/78 | HR 90 | Temp 97.6°F | Resp 16 | Wt 140.0 lb

## 2023-04-20 DIAGNOSIS — Z7189 Other specified counseling: Secondary | ICD-10-CM | POA: Insufficient documentation

## 2023-04-20 DIAGNOSIS — C3411 Malignant neoplasm of upper lobe, right bronchus or lung: Secondary | ICD-10-CM

## 2023-04-20 DIAGNOSIS — C3432 Malignant neoplasm of lower lobe, left bronchus or lung: Secondary | ICD-10-CM | POA: Insufficient documentation

## 2023-04-20 DIAGNOSIS — Z510 Encounter for antineoplastic radiation therapy: Secondary | ICD-10-CM | POA: Insufficient documentation

## 2023-04-20 DIAGNOSIS — C771 Secondary and unspecified malignant neoplasm of intrathoracic lymph nodes: Secondary | ICD-10-CM | POA: Insufficient documentation

## 2023-04-20 DIAGNOSIS — C3490 Malignant neoplasm of unspecified part of unspecified bronchus or lung: Secondary | ICD-10-CM

## 2023-04-20 DIAGNOSIS — R911 Solitary pulmonary nodule: Secondary | ICD-10-CM | POA: Diagnosis not present

## 2023-04-20 DIAGNOSIS — Z5111 Encounter for antineoplastic chemotherapy: Secondary | ICD-10-CM | POA: Insufficient documentation

## 2023-04-20 LAB — CBC WITH DIFFERENTIAL (CANCER CENTER ONLY)
Abs Immature Granulocytes: 0.01 10*3/uL (ref 0.00–0.07)
Basophils Absolute: 0.1 10*3/uL (ref 0.0–0.1)
Basophils Relative: 1 %
Eosinophils Absolute: 0.2 10*3/uL (ref 0.0–0.5)
Eosinophils Relative: 3 %
HCT: 38.1 % (ref 36.0–46.0)
Hemoglobin: 12.1 g/dL (ref 12.0–15.0)
Immature Granulocytes: 0 %
Lymphocytes Relative: 36 %
Lymphs Abs: 2.3 10*3/uL (ref 0.7–4.0)
MCH: 33.3 pg (ref 26.0–34.0)
MCHC: 31.8 g/dL (ref 30.0–36.0)
MCV: 105 fL — ABNORMAL HIGH (ref 80.0–100.0)
Monocytes Absolute: 0.6 10*3/uL (ref 0.1–1.0)
Monocytes Relative: 10 %
Neutro Abs: 3.2 10*3/uL (ref 1.7–7.7)
Neutrophils Relative %: 50 %
Platelet Count: 180 10*3/uL (ref 150–400)
RBC: 3.63 MIL/uL — ABNORMAL LOW (ref 3.87–5.11)
RDW: 12.4 % (ref 11.5–15.5)
WBC Count: 6.3 10*3/uL (ref 4.0–10.5)
nRBC: 0 % (ref 0.0–0.2)

## 2023-04-20 LAB — CMP (CANCER CENTER ONLY)
ALT: 19 U/L (ref 0–44)
AST: 28 U/L (ref 15–41)
Albumin: 4 g/dL (ref 3.5–5.0)
Alkaline Phosphatase: 93 U/L (ref 38–126)
Anion gap: 4 — ABNORMAL LOW (ref 5–15)
BUN: 16 mg/dL (ref 8–23)
CO2: 38 mmol/L — ABNORMAL HIGH (ref 22–32)
Calcium: 9.6 mg/dL (ref 8.9–10.3)
Chloride: 100 mmol/L (ref 98–111)
Creatinine: 0.67 mg/dL (ref 0.44–1.00)
GFR, Estimated: >60 mL/min (ref >60)
Glucose, Bld: 142 mg/dL — ABNORMAL HIGH (ref 70–99)
Potassium: 5.1 mmol/L (ref 3.5–5.1)
Sodium: 142 mmol/L (ref 135–145)
Total Bilirubin: 0.4 mg/dL (ref <1.2)
Total Protein: 6.6 g/dL (ref 6.5–8.1)

## 2023-04-20 MED ORDER — PROCHLORPERAZINE MALEATE 10 MG PO TABS: 10.0000 mg | ORAL_TABLET | Freq: Four times a day (QID) | ORAL | 2 refills | Status: DC | PRN

## 2023-04-20 NOTE — Progress Notes (Signed)
START OFF PATHWAY REGIMEN - Non-Small Cell Lung   [id number]:Carboplatin AUC=2 IV D1 + Paclitaxel 45 mg/m2 IV D1 q7 Days + RT:   A cycle is every 7 days, concurrent with RT:     Paclitaxel      Carboplatin   **Always confirm dose/schedule in your pharmacy ordering system**  Patient Characteristics: Local Recurrence Therapeutic Status: Local Recurrence Intent of Therapy: Non-Curative / Palliative Intent, Discussed with Patient

## 2023-04-20 NOTE — Patient Instructions (Signed)
-  There are two main categories of lung cancer, they are named based on the size of the cancer cell. One is called Non-Small cell lung cancer. The other type is Small Cell Lung Cancer -The sample (biopsy) that they took of your tumor was consistent with a subtype of Non-small cell lung cancer called Squamous Cell Carcinoma -We covered a lot of important information at your appointment today regarding what the treatment plan is moving forward. Here are the the main points that were discussed at your office visit with [place] today:  -The treatment that you will receive consists of two chemotherapy drugs called Carboplatin and Paclitaxel (also called Taxol) -We are planning on starting your treatment next week on 05/02/23 but before your start your treatment, I would like you to attend a Chemotherapy Education Class. This involves having you sit down with one of our nurse educators. She will discuss with you one-on-one more details about your treatment as well as general information about resources here at the [facility]  -Your treatment will be given every week for about 6 weeks or so (as long as you are also receiving radiation). We will check your labs (blood work) once a week . Dr. Arbutus Ped or I will see you every other treatment just to make sure that you are doing well and that everything is on track. -We will get a CT scan about 3 weeks after you complete your treatment  Medications:  -Compazine was sent to your pharmacy. This medication is for nausea. You may take this every 6 hours as needed if you feel nausous.   Referrals -I will place the referral to radiation oncology to meet with you to discuss starting radiation treatment. Please be on the lookout for a phone call from them to schedule a consultation.  -I will refer you to social work so you can discuss if  you need transportation assistance.   Follow Up:  -We will see you back for a follow up visit with week #2 of treatment.

## 2023-04-21 ENCOUNTER — Other Ambulatory Visit: Payer: Self-pay

## 2023-04-21 NOTE — Progress Notes (Signed)
Radiation Oncology         [phone] ________________________________  Outpatient Re-Consultation  Name: Amy Sandoval MRN: [id number]  Date: 04/22/2023  DOB: 1934/08/13  CC:Miguel Aschoff, MD  Si Gaul, MD   REFERRING PHYSICIAN: Si Gaul, MD  DIAGNOSIS: The primary encounter diagnosis was Right lower lobe pulmonary nodule. 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 female who is accompanied by her daughter. she is seen as a courtesy of Dr. 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 She was last seen here for follow-up on 08/18/2021 for evaluation of a rash on her back from shingles after completing radiation therapy to both lungs in February of 2023. She has continued under surveillance with Dr. Arbutus Ped since that time with routine surveillance imaging.   A routine follow-up chest CT performed shortly after her last visit on 09/18/21 showed an interval decrease in size of the spiculated nodule of the left lower lobe and two lobulated nodules of the right pulmonary apex, consistent with a treatment response. Her follow-up chest CT in November of 2023 also showed a decrease in size of the RUL and LLL nodules,  however a slight increase in size of a 6 mm RLL nodule was demonstrated. The increasing RLL nodule however appeared stable on her surveillance CT on 07/08/22. Stability of the RUL and LLL nodules were also again demonstrated.   As noted above, she had stable findings in both lungs. That is, until a surveillance chest CT on 01/11/23 demonstrated further enlargement of the RLL nodule, suspicious for metachronous primary or metastatic disease. Developing thoracic adenopathy highly suspicious for nodal metastasis was also demonstrated. CT otherwise showed a further decrease in size of the LLL, rigth apical, and RUL nodules.   She subsequently presented for a restaging PET scan on 01/27/23 which demonstrated the 9 mm RLL nodule suspicious for metastasis vs a metachronous primary, and a 10 mm subcarinal node, suspicious for nodal recurrence. PET also showed radiation changes in the right lung apex and left lower lobe, without underlying hypermetabolism.    Super-D chest CT without contrast performed on 03/28/23 demonstrated a mild increase in size of the irregular mixed cystic and solid 1.3 cm anterior right lower lobe pulmonary nodule. Stability of the mildly enlarged subcarinal lymph node was also demonstrated (indeterminate for metastatic disease). CT otherwise showed stability of the additional bilateral solid pulmonary nodules,  Accordingly, the patient was referred to Dr. Delton Coombes and underwent a bronchoscopy on 04/04/23. FNA and brushings of the RLL nodule showed findings consistent with squamous cell carcinoma. Nodal biopsies were also obtained from a station 7 and 4R which both showed squamous cell carcinoma. A station 4R lymph node was also biopsied which showed no evidence of malignancy.  Dr. Arbutus Ped presented the patient's case at the multidisciplinary conference last week. Recommended disposition is to concurrent chemoradiation with carboplatin and taxol. She is expected to receive her first cycle on  05/02/23.    PREVIOUS RADIATION THERAPY: Yes (as noted above)  PAST MEDICAL HISTORY:  Past Medical History:  Diagnosis Date   Arthritis    COPD (chronic obstructive pulmonary disease) (HCC)    COPD with chronic hypoxic respiratory failure (HCC) 08/17/2022   First diagnosis 12/2020 upon admission for multifocal pneumonia   [facility]  PFTs 2022 confirmed obstruction and mild reduction in DLCO     History of radiation therapy    RUL lung and LLL lung 06/15/2021-06/24/2021  Dr Antony Blackbird   Hx of cervical spine fracture 2/2 fall managed with neck brace 08/20/2022   Hx of herpes zoster infection of R thoracic region; no neuropathic pain    Non-small cell lung cancer (NSCLC) (HCC) 12/2020   Pneumonia    Rhinorrhea, chronic right unilateral 02/17/2023   Sepsis (HCC) 12/18/2020    PAST SURGICAL HISTORY: Past Surgical History:  Procedure Laterality Date   BRONCHIAL BIOPSY  03/09/2021   Procedure: BRONCHIAL BIOPSIES;  Surgeon: Leslye Peer, MD;  Location: [facility] ENDOSCOPY;  Service: Pulmonary;;   BRONCHIAL BIOPSY  04/04/2023   Procedure: BRONCHIAL BIOPSIES;  Surgeon: Leslye Peer, MD;  [facility] ENDOSCOPY;  Service: Pulmonary;;   BRONCHIAL BRUSHINGS  03/09/2021   Procedure: BRONCHIAL BRUSHINGS;  Surgeon: Leslye Peer, MD;  Location: [facility] ENDOSCOPY;  Service: Pulmonary;;   BRONCHIAL BRUSHINGS  04/04/2023   Procedure: BRONCHIAL BRUSHINGS;  Surgeon: Leslye Peer, MD;  Location: [facility] ENDOSCOPY;  Service: Pulmonary;;   BRONCHIAL NEEDLE ASPIRATION BIOPSY  03/09/2021   Procedure: BRONCHIAL NEEDLE ASPIRATION BIOPSIES;  Surgeon: Leslye Peer, MD;  Location: [facility] ENDOSCOPY;  Service: Pulmonary;;   BRONCHIAL NEEDLE ASPIRATION BIOPSY  04/04/2023   Procedure: BRONCHIAL NEEDLE ASPIRATION BIOPSIES;  Surgeon: Leslye Peer, MD;  Location: [facility] ENDOSCOPY;  Service: Pulmonary;;   DILATION AND CURETTAGE OF UTERUS     ENDOBRONCHIAL ULTRASOUND Right 04/04/2023   Procedure: ENDOBRONCHIAL ULTRASOUND  WITH FLURO;  Surgeon: Leslye Peer, MD;  Location: [facility] ENDOSCOPY;  Service: Pulmonary;  Laterality: Right;   EYE SURGERY     bilateral cataract removal   FIDUCIAL MARKER PLACEMENT  04/04/2023   Procedure: FIDUCIAL MARKER PLACEMENT;  Surgeon: Leslye Peer, MD;  Location: [facility] ENDOSCOPY;  Service: Pulmonary;;   TONSILLECTOMY     VIDEO BRONCHOSCOPY WITH ENDOBRONCHIAL NAVIGATION Bilateral 03/09/2021   Procedure: VIDEO BRONCHOSCOPY WITH ENDOBRONCHIAL NAVIGATION;  Surgeon: Leslye Peer, MD;  Location: [facility] ENDOSCOPY;  Service: Pulmonary;  Laterality: Bilateral;  ION w/ possible fiducial    FAMILY HISTORY:  Family History  Problem Relation Age of Onset   Hypertension Other     SOCIAL HISTORY:  Social History   Tobacco Use   Smoking status: Former    Current packs/day: 1.00    Average packs/day: 1 pack/day for 3.0 years (3.0 ttl pk-yrs)    Types: Cigarettes    Start date: 2022    Passive exposure: Past   Smokeless tobacco: Never  Vaping Use   Vaping status: Former   Substances: Nicotine, Flavoring  Substance Use Topics   Alcohol use: Yes    Comment: Social   Drug use: Never    ALLERGIES:  Allergies  Allergen Reactions   Penicillins Rash    MEDICATIONS:  Current Outpatient Medications  Medication Sig Dispense Refill   ipratropium (ATROVENT) 0.06 %  nasal spray Place 2 sprays into both nostrils 2 (two) times daily as needed for rhinitis. 15 mL 5   levothyroxine (SYNTHROID) 25 MCG tablet Take 1 tablet (25 mcg total) by mouth daily before breakfast. 30 tablet 5   OXYGEN Inhale 3 L into the lungs as needed. When 02 sats drop below 92     prochlorperazine (COMPAZINE) 10 MG tablet Take 1 tablet (10 mg total) by mouth every 6 (six) hours as needed. 30 tablet 2   naproxen sodium (ALEVE) 220 MG tablet Take 220 mg by mouth as needed. (Patient not taking: Reported on 04/22/2023)     No current facility-administered medications for this encounter.    REVIEW OF SYSTEMS:  A 10+ POINT  REVIEW OF SYSTEMS WAS OBTAINED including neurology, dermatology, psychiatry, cardiac, respiratory, lymph, extremities, GI, GU, musculoskeletal, constitutional, reproductive, HEENT.  She denies any significant cough or hemoptysis.  She denies any pain within the chest.  She reports fatigue ever since completing her SBRT several months ago.   PHYSICAL EXAM:  height is 5\' 4"  (1.626 m) and weight is 140 lb 12.8 oz (63.9 kg). Her temperature is 97.6 F (36.4 C). Her blood pressure is 150/71 (abnormal) and her pulse is 80. Her respiration is 20 and oxygen saturation is 100%.   General: Alert and oriented, in no acute distress supplemental oxygen in place at 3 L. HEENT: Head is normocephalic. Extraocular movements are intact.  Neck: Neck is supple, no palpable cervical or supraclavicular lymphadenopathy. Heart: Regular in rate and rhythm with no murmurs, rubs, or gallops. Chest: Clear to auscultation bilaterally, with no rhonchi, wheezes, or rales. Abdomen: Soft, nontender, nondistended, with no rigidity or guarding. Extremities: No cyanosis or edema. Lymphatics: see Neck Exam Skin: No concerning lesions. Musculoskeletal: symmetric strength and muscle tone throughout. Neurologic: Cranial nerves II through XII are grossly intact. No obvious focalities. Speech is fluent. Coordination is intact. Psychiatric: Judgment and insight are intact. Affect is appropriate.   ECOG = 1  0 - Asymptomatic (Fully active, able to carry on all predisease activities without restriction)  1 - Symptomatic but completely ambulatory (Restricted in physically strenuous activity but ambulatory and able to carry out work of a light or sedentary nature. For example, light housework, office work)  2 - Symptomatic, <50% in bed during the day (Ambulatory and capable of all self care but unable to carry out any work activities. Up and about more than 50% of waking hours)  3 - Symptomatic, >50% in bed, but not bedbound (Capable of  only limited self-care, confined to bed or chair 50% or more of waking hours)  4 - Bedbound (Completely disabled. Cannot carry on any self-care. Totally confined to bed or chair)  5 - Death   Santiago Glad MM, Creech RH, Tormey DC, et al. [phone]). "Toxicity and response criteria of the [facility] Group". Am. Evlyn Clines. Oncol. 5 (6): 649-55  LABORATORY DATA:  Lab Results  Component Value Date   WBC 6.3 04/20/2023   HGB 12.1 04/20/2023   HCT 38.1 04/20/2023   MCV 105.0 (H) 04/20/2023   PLT 180 04/20/2023   NEUTROABS 3.2 04/20/2023   Lab Results  Component Value Date   NA 142 04/20/2023   K 5.1 04/20/2023   CL 100 04/20/2023   CO2 38 (H) 04/20/2023   GLUCOSE 142 (H) 04/20/2023   BUN 16 04/20/2023   CREATININE 0.67 04/20/2023   CALCIUM 9.6 04/20/2023      RADIOGRAPHY: CT Super D Chest Wo Contrast  Result Date: 04/11/2023 CLINICAL DATA:  Non-small cell lung cancer. Restaging. * Tracking Code: BO * EXAM: CT CHEST WITHOUT CONTRAST TECHNIQUE: Multidetector CT imaging of the chest was performed using thin slice collimation for electromagnetic bronchoscopy planning purposes, without intravenous contrast. RADIATION DOSE REDUCTION: This exam was performed according to the departmental dose-optimization program which includes automated exposure control, adjustment of the mA and/or kV according to patient size and/or use of iterative reconstruction technique. COMPARISON:  01/27/2023 PET-CT.  01/11/2023 chest CT. FINDINGS: Cardiovascular: Normal heart size. No significant pericardial effusion/thickening. Left anterior descending coronary atherosclerosis. Atherosclerotic nonaneurysmal thoracic aorta. Normal caliber pulmonary arteries. Mediastinum/Nodes: No significant thyroid nodules. Unremarkable esophagus. No axillary adenopathy. Mildly enlarged 1.0 cm subcarinal node (series 4/image 158), not appreciably changed using similar measurement technique. No additional pathologically enlarged  mediastinal nodes. No discrete hilar adenopathy on these noncontrast images. Lungs/Pleura: No pneumothorax. No pleural effusion. Irregular solid 1.3 x 1.2 cm apical right upper lobe pulmonary nodule (series 3/image 14), previously 1.3 x 1.3 cm using similar measurement technique, not appreciably changed. Separate posterior apical right upper lobe 1.1 x 0.8 cm solid pulmonary nodule (series 3/image 23), previously 1.1 x 0.8 cm using similar measurement technique, stable. Irregular mixed cystic and solid 1.3 x 0.9 cm anterior right lower lobe pulmonary nodule (series 3/image 107, mildly increased from 1.1 x 0.7 cm. Solid 1.3 x 1.2 cm left lower lobe pulmonary nodule (series 3/image 78), previously 1.4 x 1.2 cm using similar measurement technique, stable. Sharply marginated mild bandlike surrounding consolidation in the left lower lobe is compatible with post radiation change. No acute consolidative airspace disease or new significant pulmonary nodules. Upper abdomen: Small hiatal hernia. Simple 1.7 cm upper left renal cyst, for which no follow-up imaging is recommended. Musculoskeletal: No aggressive appearing focal osseous lesions. Mild thoracic spondylosis. IMPRESSION: 1. Irregular mixed cystic and solid 1.3 cm anterior right lower lobe pulmonary nodule, mildly increased in size since 01/11/2023 chest CT, highly suspicious for malignancy, either a metachronous primary bronchogenic malignancy or metastasis. 2. Additional bilateral solid pulmonary nodules are stable. 3. Stable mildly enlarged subcarinal lymph node, indeterminate for nodal metastasis. 4. One vessel coronary atherosclerosis. 5. Small hiatal hernia. 6.  Aortic Atherosclerosis (ICD10-I70.0). Electronically Signed   By: Delbert Phenix M.D.   On: 04/11/2023 20:08   DG Chest Port 1 View Result Date: 04/04/2023 CLINICAL DATA:  Post bronchoscopy with biopsy EXAM: PORTABLE CHEST 1 VIEW COMPARISON:  03/28/2023 FINDINGS: Cardiac enlargement. Low lung volumes  with diffuse coarse interstitial fibrosis, likely representing chronic interstitial lung disease. Surgical clip in the right lower lung nodule seen at CT are not well demonstrated radiographically. No pleural effusions. No pneumothorax. Calcification of the aorta. Degenerative changes in the spine and shoulders. IMPRESSION: Coarse interstitial changes in the lungs likely representing chronic interstitial lung disease. Surgical clip in the right lower chest. No pneumothorax. Electronically Signed   By: Burman Nieves M.D.   On: 04/04/2023 19:34   DG C-ARM BRONCHOSCOPY Result Date: 04/04/2023 C-ARM BRONCHOSCOPY: Fluoroscopy was utilized by the requesting physician.  No radiographic interpretation.      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  Based on the location she would be a candidate for additional radiation therapy.  I would  recommend SBRT directed at the lesion in the right lower lobe.  Conventional radiation would be recommended for treatments directed at the mediastinum along with radiosensitizing chemotherapy.  I discussed with the patient that given her prior radiation therapy and lung function this radiation therapy could further compromise her respiratory status.  In rare situations could result in death.  We discussed other potential alternatives.  She understands that the radiation and chemotherapy would give her the best chance for control.  After careful consideration she would like to proceed with radiation along with radiosensitizing chemotherapy.  Today, I talked to the patient and family about the findings and work-up thus far.  We discussed the natural history of non-small cell lung cancer and  general treatment, highlighting the role of radiotherapy in the management.  We discussed the available radiation techniques, and focused on the details of logistics and delivery.  We reviewed the anticipated acute and late sequelae associated with radiation in this setting.  The patient was encouraged to ask questions that I answered to the best of my ability.  A patient consent form was discussed and signed.  We retained a copy for our records.  The patient would like to proceed with radiation and will be scheduled for CT simulation.  PLAN: She will be scheduled for CT simulation next week.  Treatments to begin concomitant with radiosensitizing chemotherapy.  Anticipate 6 weeks of radiation therapy.  Her SBRT directed at the right lower lung lesion will take place at a later date during the course of her daily radiation treatments.   45 minutes of total time was spent for this patient encounter, including preparation, face-to-face counseling with the patient and coordination of care, physical exam, and documentation of the encounter.   ------------------------------------------------  Billie Lade, PhD, MD  This document serves as a record of services personally performed by Antony Blackbird, MD. It was created on his behalf by Neena Rhymes, a trained medical scribe. The creation of this record is based on the scribe's personal observations and the provider's statements to them. This document has been checked and approved by the attending provider.

## 2023-04-21 NOTE — Progress Notes (Signed)
Location of tumor and Histology per Pathology Report:    Biopsy:      Past/Anticipated interventions by surgeon, if any:   ROBOTIC ASSISTED NAVIGATIONAL BRONCHOSCOPY (Right) ENDOBRONCHIAL ULTRASOUND WITH FLURO (Right) BRONCHIAL NEEDLE ASPIRATION BIOPSIES BRONCHIAL BIOPSIES BRONCHIAL BRUSHINGS FIDUCIAL MARKER PLACEMENT    Past/Anticipated interventions by medical oncology, if any: Dr. Shirline Frees   Concurrent chemoradiation with carboplatin for an AUC of 2 paclitaxel 45 mg/m. First dose expected on 05/02/2023    Pain issues, if any:  no    SAFETY ISSUES: Prior radiation? Yes to left lung 06/15/21-06/24/21 Pacemaker/ICD? no Possible current pregnancy? no Is the patient on methotrexate? no  Current Complaints / other details:     BP (!) 150/71 (BP Location: Left Arm, Patient Position: Sitting, Cuff Size: Normal)   Pulse 80   Temp 97.6 F (36.4 C)   Resp 20   Ht 5\' 4"  (1.626 m)   Wt 140 lb 12.8 oz (63.9 kg)   LMP  (LMP Unknown)   SpO2 100%   PF (!) 3 L/min   BMI 24.17 kg/m

## 2023-04-22 ENCOUNTER — Ambulatory Visit
Admission: RE | Admit: 2023-04-22 | Discharge: 2023-04-22 | Disposition: A | Discharge status: Home / Self Care | Payer: Medicare Other | Source: Ambulatory Visit | Attending: Radiation Oncology | Admitting: Radiation Oncology

## 2023-04-22 ENCOUNTER — Inpatient Hospital Stay: Payer: Medicare Other | Admitting: Licensed Clinical Social Worker

## 2023-04-22 ENCOUNTER — Encounter: Payer: Self-pay | Admitting: Radiation Oncology

## 2023-04-22 VITALS — BP 150/71 | HR 80 | Temp 97.6°F | Resp 20 | Ht 64.0 in | Wt 140.8 lb

## 2023-04-22 DIAGNOSIS — Z87891 Personal history of nicotine dependence: Secondary | ICD-10-CM | POA: Insufficient documentation

## 2023-04-22 DIAGNOSIS — Z79890 Hormone replacement therapy (postmenopausal): Secondary | ICD-10-CM | POA: Diagnosis not present

## 2023-04-22 DIAGNOSIS — I2510 Atherosclerotic heart disease of native coronary artery without angina pectoris: Secondary | ICD-10-CM | POA: Diagnosis not present

## 2023-04-22 DIAGNOSIS — R599 Enlarged lymph nodes, unspecified: Secondary | ICD-10-CM | POA: Insufficient documentation

## 2023-04-22 DIAGNOSIS — Z923 Personal history of irradiation: Secondary | ICD-10-CM | POA: Insufficient documentation

## 2023-04-22 DIAGNOSIS — R911 Solitary pulmonary nodule: Secondary | ICD-10-CM

## 2023-04-22 DIAGNOSIS — C3411 Malignant neoplasm of upper lobe, right bronchus or lung: Secondary | ICD-10-CM

## 2023-04-22 DIAGNOSIS — M129 Arthropathy, unspecified: Secondary | ICD-10-CM | POA: Insufficient documentation

## 2023-04-22 DIAGNOSIS — K449 Diaphragmatic hernia without obstruction or gangrene: Secondary | ICD-10-CM | POA: Insufficient documentation

## 2023-04-22 DIAGNOSIS — C3431 Malignant neoplasm of lower lobe, right bronchus or lung: Secondary | ICD-10-CM | POA: Diagnosis present

## 2023-04-22 DIAGNOSIS — I700 Atherosclerosis of aorta: Secondary | ICD-10-CM | POA: Insufficient documentation

## 2023-04-22 DIAGNOSIS — C3490 Malignant neoplasm of unspecified part of unspecified bronchus or lung: Secondary | ICD-10-CM

## 2023-04-22 DIAGNOSIS — J449 Chronic obstructive pulmonary disease, unspecified: Secondary | ICD-10-CM | POA: Diagnosis not present

## 2023-04-22 LAB — MOLECULAR PATHOLOGY

## 2023-04-22 NOTE — Progress Notes (Signed)
[facility] CSW Progress Note  Clinical Child psychotherapist contacted patient by phone to discuss transportation concerns.  Pt resides alone and uses oxygen at home.  Pt reports she is independent in ADLs and does still drive on occasion, but while undergoing radiation daily for 6 weeks she will need assistance some of the time for transportation.  Pt's information forwarded to the transportation department.  Pt provided w/ contact details for CSW should additional concerns arise.       Rachel Moulds, LCSW Clinical Social Worker [facility]

## 2023-04-25 ENCOUNTER — Ambulatory Visit
Admission: RE | Admit: 2023-04-25 | Discharge: 2023-04-25 | Disposition: A | Discharge status: Home / Self Care | Payer: Medicare Other | Source: Ambulatory Visit | Attending: Radiation Oncology | Admitting: Radiation Oncology

## 2023-04-25 DIAGNOSIS — C3432 Malignant neoplasm of lower lobe, left bronchus or lung: Secondary | ICD-10-CM | POA: Insufficient documentation

## 2023-04-25 DIAGNOSIS — Z510 Encounter for antineoplastic radiation therapy: Secondary | ICD-10-CM | POA: Insufficient documentation

## 2023-04-25 DIAGNOSIS — R911 Solitary pulmonary nodule: Secondary | ICD-10-CM | POA: Insufficient documentation

## 2023-04-25 DIAGNOSIS — C3411 Malignant neoplasm of upper lobe, right bronchus or lung: Secondary | ICD-10-CM

## 2023-04-25 DIAGNOSIS — C771 Secondary and unspecified malignant neoplasm of intrathoracic lymph nodes: Secondary | ICD-10-CM | POA: Insufficient documentation

## 2023-04-26 ENCOUNTER — Ambulatory Visit (HOSPITAL_COMMUNITY)
Admission: RE | Admit: 2023-04-26 | Discharge: 2023-04-26 | Disposition: A | Discharge status: Home / Self Care | Payer: Medicare Other | Source: Ambulatory Visit | Attending: Physician Assistant | Admitting: Physician Assistant

## 2023-04-26 DIAGNOSIS — C3490 Malignant neoplasm of unspecified part of unspecified bronchus or lung: Secondary | ICD-10-CM | POA: Diagnosis present

## 2023-04-26 MED ORDER — GADOBUTROL 1 MMOL/ML IV SOLN
6.0000 mL | Freq: Once | INTRAVENOUS | Status: AC | PRN
  Administered 2023-04-26: 6 mL via INTRAVENOUS

## 2023-04-28 ENCOUNTER — Inpatient Hospital Stay: Payer: Medicare Other

## 2023-04-30 DIAGNOSIS — Z510 Encounter for antineoplastic radiation therapy: Secondary | ICD-10-CM | POA: Diagnosis not present

## 2023-05-02 ENCOUNTER — Inpatient Hospital Stay: Payer: Medicare Other

## 2023-05-02 ENCOUNTER — Inpatient Hospital Stay (HOSPITAL_BASED_OUTPATIENT_CLINIC_OR_DEPARTMENT_OTHER): Payer: Medicare Other | Admitting: Internal Medicine

## 2023-05-02 ENCOUNTER — Ambulatory Visit
Admission: RE | Admit: 2023-05-02 | Discharge: 2023-05-02 | Disposition: A | Discharge status: Home / Self Care | Payer: Medicare Other | Source: Ambulatory Visit | Attending: Radiation Oncology | Admitting: Radiation Oncology

## 2023-05-02 ENCOUNTER — Other Ambulatory Visit: Payer: Self-pay

## 2023-05-02 ENCOUNTER — Ambulatory Visit: Payer: Medicare Other | Admitting: Radiation Oncology

## 2023-05-02 VITALS — BP 134/50 | HR 66 | Temp 98.2°F | Resp 18

## 2023-05-02 VITALS — BP 131/92 | HR 74 | Temp 98.0°F | Resp 15 | Ht 64.0 in | Wt 138.9 lb

## 2023-05-02 DIAGNOSIS — C3411 Malignant neoplasm of upper lobe, right bronchus or lung: Secondary | ICD-10-CM

## 2023-05-02 DIAGNOSIS — Z510 Encounter for antineoplastic radiation therapy: Secondary | ICD-10-CM | POA: Diagnosis not present

## 2023-05-02 LAB — CMP (CANCER CENTER ONLY)
ALT: 19 U/L (ref 0–44)
AST: 27 U/L (ref 15–41)
Albumin: 3.8 g/dL (ref 3.5–5.0)
Alkaline Phosphatase: 80 U/L (ref 38–126)
Anion gap: 5 (ref 5–15)
BUN: 14 mg/dL (ref 8–23)
CO2: 34 mmol/L — ABNORMAL HIGH (ref 22–32)
Calcium: 8.9 mg/dL (ref 8.9–10.3)
Chloride: 101 mmol/L (ref 98–111)
Creatinine: 0.61 mg/dL (ref 0.44–1.00)
GFR, Estimated: >60 mL/min (ref >60)
Glucose, Bld: 110 mg/dL — ABNORMAL HIGH (ref 70–99)
Potassium: 4.2 mmol/L (ref 3.5–5.1)
Sodium: 140 mmol/L (ref 135–145)
Total Bilirubin: 0.3 mg/dL (ref <1.2)
Total Protein: 6.8 g/dL (ref 6.5–8.1)

## 2023-05-02 LAB — RAD ONC ARIA SESSION SUMMARY
Course Elapsed Days: 0
Plan Fractions Treated to Date: 1
Plan Prescribed Dose Per Fraction: 2 Gy
Plan Total Fractions Prescribed: 30
Plan Total Prescribed Dose: 60 Gy
Reference Point Dosage Given to Date: 2 Gy
Reference Point Session Dosage Given: 2 Gy
Session Number: 1

## 2023-05-02 LAB — CBC WITH DIFFERENTIAL (CANCER CENTER ONLY)
Abs Immature Granulocytes: 0.01 10*3/uL (ref 0.00–0.07)
Basophils Absolute: 0 10*3/uL (ref 0.0–0.1)
Basophils Relative: 1 %
Eosinophils Absolute: 0.2 10*3/uL (ref 0.0–0.5)
Eosinophils Relative: 4 %
HCT: 37.1 % (ref 36.0–46.0)
Hemoglobin: 11.6 g/dL — ABNORMAL LOW (ref 12.0–15.0)
Immature Granulocytes: 0 %
Lymphocytes Relative: 32 %
Lymphs Abs: 1.8 10*3/uL (ref 0.7–4.0)
MCH: 32.3 pg (ref 26.0–34.0)
MCHC: 31.3 g/dL (ref 30.0–36.0)
MCV: 103.3 fL — ABNORMAL HIGH (ref 80.0–100.0)
Monocytes Absolute: 0.6 10*3/uL (ref 0.1–1.0)
Monocytes Relative: 11 %
Neutro Abs: 2.9 10*3/uL (ref 1.7–7.7)
Neutrophils Relative %: 52 %
Platelet Count: 182 10*3/uL (ref 150–400)
RBC: 3.59 MIL/uL — ABNORMAL LOW (ref 3.87–5.11)
RDW: 12.5 % (ref 11.5–15.5)
WBC Count: 5.5 10*3/uL (ref 4.0–10.5)
nRBC: 0 % (ref 0.0–0.2)

## 2023-05-02 MED ORDER — SODIUM CHLORIDE 0.9 % IV SOLN
131.4000 mg | Freq: Once | INTRAVENOUS | Status: AC
  Administered 2023-05-02: 130 mg via INTRAVENOUS
  Filled 2023-05-02: qty 13

## 2023-05-02 MED ORDER — DEXAMETHASONE SODIUM PHOSPHATE 10 MG/ML IJ SOLN
10.0000 mg | Freq: Once | INTRAMUSCULAR | Status: AC
  Administered 2023-05-02: 10 mg via INTRAVENOUS
  Filled 2023-05-02: qty 1

## 2023-05-02 MED ORDER — SODIUM CHLORIDE 0.9 % IV SOLN
45.0000 mg/m2 | Freq: Once | INTRAVENOUS | Status: AC
Start: 2023-05-02 — End: 2023-05-02
  Administered 2023-05-02: 78 mg via INTRAVENOUS
  Filled 2023-05-02: qty 13

## 2023-05-02 MED ORDER — DIPHENHYDRAMINE HCL 50 MG/ML IJ SOLN
25.0000 mg | Freq: Once | INTRAMUSCULAR | Status: AC
  Administered 2023-05-02: 25 mg via INTRAVENOUS
  Filled 2023-05-02: qty 1

## 2023-05-02 MED ORDER — FAMOTIDINE IN NACL 20-0.9 MG/50ML-% IV SOLN
20.0000 mg | Freq: Once | INTRAVENOUS | Status: AC
  Administered 2023-05-02: 20 mg via INTRAVENOUS
  Filled 2023-05-02: qty 50

## 2023-05-02 MED ORDER — SODIUM CHLORIDE 0.9 % IV SOLN
INTRAVENOUS | Status: DC
Start: 2023-05-02 — End: 2023-05-02

## 2023-05-02 MED ORDER — PALONOSETRON HCL INJECTION 0.25 MG/5ML
0.2500 mg | Freq: Once | INTRAVENOUS | Status: AC
  Administered 2023-05-02: 0.25 mg via INTRAVENOUS
  Filled 2023-05-02: qty 5

## 2023-05-02 NOTE — Progress Notes (Signed)
[facility] [phone]   [phone]  OFFICE PROGRESS NOTE  Miguel Aschoff, MD [address] [place] [address]  DIAGNOSIS:  Stage IV (T2b, N0, M1 a) non-small cell lung cancer with unclear subtype on the pathology report diagnosed in November 2022 and presented with right upper lobe lung mass as well as left upper lobe nodule in addition to right lower lobe nodule. In October 2024 she was found to have suspicious metachronous primary with RLL nodule and subcarinal node vs. Metastatic disease which was biopsied and consistent with Squamous cell carcinoma.    DETECTED ALTERATION(S) / BIOMARKER(S)     % CFDNA OR AMPLIFICATION        ASSOCIATED FDA-APPROVED THERAPIES         CLINICAL TRIAL AVAILABILITY [id number] 0.1% NoneNo   PD-L1 expression 1%   PRIOR THERAPY: SBRT to the left and right lung nodules under the care of Dr. Roselind Messier completed on June 24, 2021.    CURRENT THERAPY: Concurrent chemoradiation with carboplatin for an AUC of 2 paclitaxel 45 mg/m.  First dose expected on 05/02/2023  INTERVAL HISTORY: Ravyn Tweten 88 y.o. female for follow-up visit accompanied by her granddaughter Hospital doctor. Discussed the use of AI scribe software for clinical note transcription with the patient, who gave verbal consent to proceed.  History of Present Illness   The patient, aged 17, was initially diagnosed with stage 4 small cell lung cancer in 2022 and underwent SBRT for bilateral lung nodules. In December 2024, a new lesion was identified in the right lung along with mediastinal lymphadenopathy, reclassifying the disease as locally advanced.  The patient reports persistent shortness of breath, for which she is on supplemental oxygen. There are no new complaints, and the patient denies chest pain, cough, hemoptysis, significant weight loss, nausea, or diarrhea.  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         MEDICAL HISTORY: Past Medical History:  Diagnosis Date   Arthritis    COPD (chronic obstructive pulmonary disease) (HCC)    COPD with chronic hypoxic respiratory failure (HCC) 08/17/2022   First diagnosis 12/2020 upon admission for multifocal pneumonia   [facility]  PFTs 2022 confirmed obstruction and mild reduction in DLCO     History of radiation therapy    RUL lung and LLL lung 06/15/2021-06/24/2021  Dr Antony Blackbird   Hx of cervical spine fracture 2/2 fall managed with neck brace 08/20/2022   Hx of herpes zoster infection of R thoracic region; no neuropathic pain    Non-small cell lung cancer (NSCLC) (HCC) 12/2020   Pneumonia    Rhinorrhea, chronic right unilateral 02/17/2023   Sepsis (HCC) 12/18/2020    ALLERGIES:  is allergic to penicillins.  MEDICATIONS:  Current Outpatient Medications  Medication Sig Dispense Refill   ipratropium (ATROVENT) 0.06 % nasal spray Place 2 sprays into both nostrils 2 (two) times daily as needed for rhinitis. 15 mL 5   levothyroxine (SYNTHROID) 25 MCG tablet Take 1 tablet (25 mcg total) by mouth daily before breakfast. 30 tablet 5   naproxen sodium (ALEVE) 220 MG tablet Take 220 mg by mouth as needed. (Patient not taking: Reported on 04/22/2023)     OXYGEN Inhale 3 L into the lungs as needed. When 02 sats drop below 92     prochlorperazine (COMPAZINE) 10 MG tablet Take 1 tablet (10  mg total) by mouth every 6 (six) hours as needed. 30 tablet 2   No current facility-administered medications for this visit.    SURGICAL HISTORY:  Past Surgical History:  Procedure Laterality Date   BRONCHIAL BIOPSY  03/09/2021   Procedure: BRONCHIAL BIOPSIES;  Surgeon: Leslye Peer, MD;  Location: [facility] ENDOSCOPY;  Service: Pulmonary;;   BRONCHIAL BIOPSY  04/04/2023   Procedure: BRONCHIAL BIOPSIES;  Surgeon: Leslye Peer, MD;  Location: [facility] ENDOSCOPY;  Service: Pulmonary;;   BRONCHIAL  BRUSHINGS  03/09/2021   Procedure: BRONCHIAL BRUSHINGS;  Surgeon: Leslye Peer, MD;  Location: [facility] ENDOSCOPY;  Service: Pulmonary;;   BRONCHIAL BRUSHINGS  04/04/2023   Procedure: BRONCHIAL BRUSHINGS;  Surgeon: Leslye Peer, MD;  Location: [facility] ENDOSCOPY;  Service: Pulmonary;;   BRONCHIAL NEEDLE ASPIRATION BIOPSY  03/09/2021   Procedure: BRONCHIAL NEEDLE ASPIRATION BIOPSIES;  Surgeon: Leslye Peer, MD;  Location: [facility] ENDOSCOPY;  Service: Pulmonary;;   BRONCHIAL NEEDLE ASPIRATION BIOPSY  04/04/2023   Procedure: BRONCHIAL NEEDLE ASPIRATION BIOPSIES;  Surgeon: Leslye Peer, MD;  Location: [facility] ENDOSCOPY;  Service: Pulmonary;;   DILATION AND CURETTAGE OF UTERUS     ENDOBRONCHIAL ULTRASOUND Right 04/04/2023   Procedure: ENDOBRONCHIAL ULTRASOUND WITH FLURO;  Surgeon: Leslye Peer, MD;  Location: [facility] ENDOSCOPY;  Service: Pulmonary;  Laterality: Right;   EYE SURGERY     bilateral cataract removal   FIDUCIAL MARKER PLACEMENT  04/04/2023   Procedure: FIDUCIAL MARKER PLACEMENT;  Surgeon: Leslye Peer, MD;  Location: [facility] ENDOSCOPY;  Service: Pulmonary;;   TONSILLECTOMY     VIDEO BRONCHOSCOPY WITH ENDOBRONCHIAL NAVIGATION Bilateral 03/09/2021   Procedure: VIDEO BRONCHOSCOPY WITH ENDOBRONCHIAL NAVIGATION;  Surgeon: Leslye Peer, MD;  Location: [facility] ENDOSCOPY;  Service: Pulmonary;  Laterality: Bilateral;  ION w/ possible fiducial    REVIEW OF SYSTEMS:  Constitutional: positive for fatigue Eyes: negative Ears, nose, mouth, throat, and face: negative Respiratory: positive for dyspnea on exertion Cardiovascular: negative Gastrointestinal: negative Genitourinary:negative Integument/breast: negative Hematologic/lymphatic: negative Musculoskeletal:negative Neurological: negative Behavioral/Psych: negative Endocrine: negative Allergic/Immunologic: negative   PHYSICAL EXAMINATION: General appearance: alert, cooperative, fatigued, and no distress Head: Normocephalic, without obvious abnormality,  atraumatic Neck: no adenopathy, no JVD, supple, symmetrical, trachea midline, and thyroid not enlarged, symmetric, no tenderness/mass/nodules Lymph nodes: Cervical, supraclavicular, and axillary nodes normal. Resp: clear to auscultation bilaterally Back: symmetric, no curvature. ROM normal. No CVA tenderness. Cardio: regular rate and rhythm, S1, S2 normal, no murmur, click, rub or gallop GI: soft, non-tender; bowel sounds normal; no masses,  no organomegaly Extremities: extremities normal, atraumatic, no cyanosis or edema Neurologic: Alert and oriented X 3, normal strength and tone. Normal symmetric reflexes. Normal coordination and gait  ECOG PERFORMANCE STATUS: 1 - Symptomatic but completely ambulatory  Blood pressure (!) 131/92, pulse 74, temperature 98 F (36.7 C), temperature source Temporal, resp. rate 15, height 5\' 4"  (1.626 m), weight 138 lb 14.4 oz (63 kg), SpO2 94%.  LABORATORY DATA: Lab Results  Component Value Date   WBC 5.5 05/02/2023   HGB 11.6 (L) 05/02/2023   HCT 37.1 05/02/2023   MCV 103.3 (H) 05/02/2023   PLT 182 05/02/2023      Chemistry      Component Value Date/Time   NA 140 05/02/2023 0826   K 4.2 05/02/2023 0826   CL 101 05/02/2023 0826   CO2 34 (H) 05/02/2023 0826   BUN 14 05/02/2023 0826   CREATININE 0.61 05/02/2023 0826      Component Value Date/Time   CALCIUM 8.9 05/02/2023 0826  ALKPHOS 80 05/02/2023 0826   AST 27 05/02/2023 0826   ALT 19 05/02/2023 0826   BILITOT 0.3 05/02/2023 0826       RADIOGRAPHIC STUDIES: MR Brain W Wo Contrast Result Date: 04/29/2023 CLINICAL DATA:  Non-small cell lung cancer, metastatic disease evaluation EXAM: MRI HEAD WITHOUT AND WITH CONTRAST TECHNIQUE: Multiplanar, multiecho pulse sequences of the brain and surrounding structures were obtained without and with intravenous contrast. CONTRAST:  6mL GADAVIST GADOBUTROL 1 MMOL/ML IV SOLN COMPARISON:  03/17/2021 MRI head FINDINGS: Brain: No restricted diffusion to  suggest acute or subacute infarct. No abnormal parenchymal or meningeal enhancement. no acute hemorrhage, mass, mass effect, or midline shift. No hydrocephalus or extra-axial collection. Pituitary and craniocervical junction within normal limits. No hemosiderin deposition to suggest remote hemorrhage. Normal cerebral volume for age. Redemonstrated T2 hyperintense, T1 hypointense signal in the medial left frontal lobe, which was previously suspected to be cortical but now appears to be in the subcortical white matter (series 8, image 16 and series 9, image 32). This is not associated with abnormal enhancement and appears overall unchanged compared to the prior exam. Vascular: Normal arterial flow voids. Normal arterial and venous enhancement. Skull and upper cervical spine: Normal marrow signal. Sinuses/Orbits: Mild mucosal thickening in the ethmoid air cells. Status post bilateral lens replacements. No acute finding in the orbits. Other: The mastoid air cells are well aerated. IMPRESSION: 1. No evidence of intracranial metastatic disease. 2. Redemonstrated T2 hyperintense, T1 hypointense, nonenhancing lesion in the subcortical medial left frontal lobe, which appears overall unchanged compared to 2022. This is nonspecific but the appearance is inconsistent with encephalomalacia from remote infarct. 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 Attention on follow-up. Electronically Signed   By: Wiliam Ke M.D.   On: 04/29/2023 02:01   DG Chest Port 1 View Result Date: 04/04/2023 CLINICAL DATA:  Post bronchoscopy with biopsy EXAM: PORTABLE CHEST 1 VIEW COMPARISON:  03/28/2023 FINDINGS: Cardiac enlargement. Low lung volumes with diffuse coarse interstitial fibrosis, likely representing chronic interstitial lung disease. Surgical clip in the right lower lung nodule seen at CT are not well demonstrated radiographically. No pleural effusions. No pneumothorax. Calcification of the aorta. Degenerative changes in the spine  and shoulders. IMPRESSION: Coarse interstitial changes in the lungs likely representing chronic interstitial lung disease. Surgical clip in the right lower chest. No pneumothorax. Electronically Signed   By: Burman Nieves M.D.   On: 04/04/2023 19:34   DG C-ARM BRONCHOSCOPY Result Date: 04/04/2023 C-ARM BRONCHOSCOPY: Fluoroscopy was utilized by the requesting physician.  No radiographic interpretation.    ASSESSMENT AND PLAN: This is a very pleasant 88 years old white female with a stage IV (T2b, N0, M1 a) non-small cell lung cancer, squamous cell carcinoma diagnosed in November 2022 and presented with right upper lobe lung mass and adjacent nodule in addition to synchronous primary versus oligometastatic nodule in the left lower lobe diagnosed and November 2022. Molecular studies showed no actionable mutations and PD-L1 expression of 1%. The patient status post SBRT to the left and right lung nodule under the care of Dr. Roselind Messier. The patient has been in observation and she was feeling fine except for the baseline shortness of breath. She had evidence for disease recurrence with non-small cell lung cancer, squamous cell carcinoma in December 2024. The patient is currently undergoing a course of concurrent chemoradiation with weekly carboplatin for AUC of 2 and paclitaxel 45 Mg/M2.  She is starting the first cycle of her treatment today.    Locally Advanced  Right Lung Cancer Bilateral lung nodules treated with SPRT in 2022. In December 2024, a new lesion was found in the right lung with mediastinal lymph node involvement. Currently treated with concurrent chemotherapy (carboplatin and paclitaxel) and radiation. Managed by Dr. Roselind Messier. No new complaints; ongoing shortness of breath managed with supplemental oxygen. No chest pain, new cough, hemoptysis, significant weight loss, nausea, vomiting, or diarrhea. Informed consent obtained for chemotherapy, including discussion of risks (e.g., nausea, fatigue),  benefits (tumor reduction), and alternatives (surgery, different chemo regimens). Benadryl dose reduced to 25 mg IV due to age to minimize sedation. 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 No specific health maintenance issues discussed. - None.   The patient was advised to call immediately if she has any concerning symptoms in the interval. The patient voices understanding of current disease status and treatment options and is in agreement with the current care plan.  All questions were answered. The patient knows to call the clinic with any problems, questions or concerns. We can certainly see the patient much sooner if necessary.   Disclaimer: This note was dictated with voice recognition software. Similar sounding words can inadvertently be transcribed and may not be corrected upon review.

## 2023-05-02 NOTE — Patient Instructions (Signed)

## 2023-05-03 ENCOUNTER — Telehealth: Payer: Self-pay

## 2023-05-03 ENCOUNTER — Other Ambulatory Visit: Payer: Self-pay

## 2023-05-03 ENCOUNTER — Ambulatory Visit
Admission: RE | Admit: 2023-05-03 | Discharge: 2023-05-03 | Disposition: A | Discharge status: Home / Self Care | Payer: Medicare Other | Source: Ambulatory Visit | Attending: Radiation Oncology | Admitting: Radiation Oncology

## 2023-05-03 ENCOUNTER — Ambulatory Visit: Payer: Medicare Other

## 2023-05-03 DIAGNOSIS — Z510 Encounter for antineoplastic radiation therapy: Secondary | ICD-10-CM | POA: Diagnosis not present

## 2023-05-03 DIAGNOSIS — C3411 Malignant neoplasm of upper lobe, right bronchus or lung: Secondary | ICD-10-CM

## 2023-05-03 LAB — RAD ONC ARIA SESSION SUMMARY
Course Elapsed Days: 1
Plan Fractions Treated to Date: 2
Plan Prescribed Dose Per Fraction: 2 Gy
Plan Total Fractions Prescribed: 30
Plan Total Prescribed Dose: 60 Gy
Reference Point Dosage Given to Date: 4 Gy
Reference Point Session Dosage Given: 2 Gy
Session Number: 2

## 2023-05-03 MED ORDER — SONAFINE EX EMUL
1.0000 | Freq: Once | CUTANEOUS | Status: AC
  Administered 2023-05-03: 1 via TOPICAL

## 2023-05-03 NOTE — Telephone Encounter (Signed)
-----   Message from Nurse Aura Fey sent at 05/02/2023  1:31 PM EST ----- Regarding: 1st Time Follow up D1C1 Taxol/Carbo Dr [place] Ped. No issues during treatment

## 2023-05-03 NOTE — Telephone Encounter (Signed)
Amy Sandoval states that she is doing fine. She is eating, drinking, and urinating well. She knows to call the office at [phone] if  she has any questions or concerns.

## 2023-05-05 ENCOUNTER — Ambulatory Visit
Admission: RE | Admit: 2023-05-05 | Discharge: 2023-05-05 | Disposition: A | Discharge status: Home / Self Care | Payer: Medicare Other | Source: Ambulatory Visit | Attending: Radiation Oncology | Admitting: Radiation Oncology

## 2023-05-05 ENCOUNTER — Other Ambulatory Visit: Payer: Self-pay

## 2023-05-05 DIAGNOSIS — Z9221 Personal history of antineoplastic chemotherapy: Secondary | ICD-10-CM | POA: Diagnosis not present

## 2023-05-05 DIAGNOSIS — C3411 Malignant neoplasm of upper lobe, right bronchus or lung: Secondary | ICD-10-CM | POA: Diagnosis not present

## 2023-05-05 DIAGNOSIS — Z79890 Hormone replacement therapy (postmenopausal): Secondary | ICD-10-CM | POA: Insufficient documentation

## 2023-05-05 DIAGNOSIS — Z923 Personal history of irradiation: Secondary | ICD-10-CM | POA: Insufficient documentation

## 2023-05-05 DIAGNOSIS — Z510 Encounter for antineoplastic radiation therapy: Secondary | ICD-10-CM | POA: Diagnosis present

## 2023-05-05 DIAGNOSIS — M129 Arthropathy, unspecified: Secondary | ICD-10-CM | POA: Diagnosis not present

## 2023-05-05 DIAGNOSIS — J449 Chronic obstructive pulmonary disease, unspecified: Secondary | ICD-10-CM | POA: Diagnosis not present

## 2023-05-05 DIAGNOSIS — Z5111 Encounter for antineoplastic chemotherapy: Secondary | ICD-10-CM | POA: Diagnosis not present

## 2023-05-05 LAB — RAD ONC ARIA SESSION SUMMARY
Course Elapsed Days: 3
Plan Fractions Treated to Date: 3
Plan Prescribed Dose Per Fraction: 2 Gy
Plan Total Fractions Prescribed: 30
Plan Total Prescribed Dose: 60 Gy
Reference Point Dosage Given to Date: 6 Gy
Reference Point Session Dosage Given: 2 Gy
Session Number: 3

## 2023-05-06 ENCOUNTER — Ambulatory Visit
Admission: RE | Admit: 2023-05-06 | Discharge: 2023-05-06 | Disposition: A | Discharge status: Home / Self Care | Payer: Medicare Other | Source: Ambulatory Visit | Attending: Radiation Oncology | Admitting: Radiation Oncology

## 2023-05-06 ENCOUNTER — Other Ambulatory Visit: Payer: Self-pay

## 2023-05-06 DIAGNOSIS — Z510 Encounter for antineoplastic radiation therapy: Secondary | ICD-10-CM | POA: Diagnosis not present

## 2023-05-06 LAB — RAD ONC ARIA SESSION SUMMARY
Course Elapsed Days: 4
Plan Fractions Treated to Date: 4
Plan Prescribed Dose Per Fraction: 2 Gy
Plan Total Fractions Prescribed: 30
Plan Total Prescribed Dose: 60 Gy
Reference Point Dosage Given to Date: 8 Gy
Reference Point Session Dosage Given: 2 Gy
Session Number: 4

## 2023-05-09 ENCOUNTER — Institutional Professional Consult (permissible substitution): Payer: Medicare Other | Admitting: Radiation Oncology

## 2023-05-09 ENCOUNTER — Inpatient Hospital Stay: Payer: Medicare Other

## 2023-05-09 ENCOUNTER — Other Ambulatory Visit: Payer: Self-pay

## 2023-05-09 ENCOUNTER — Ambulatory Visit
Admission: RE | Admit: 2023-05-09 | Discharge: 2023-05-09 | Disposition: A | Discharge status: Home / Self Care | Payer: Medicare Other | Source: Ambulatory Visit | Attending: Radiation Oncology | Admitting: Radiation Oncology

## 2023-05-09 VITALS — BP 138/85 | HR 85 | Temp 98.1°F | Resp 18 | Wt 138.5 lb

## 2023-05-09 DIAGNOSIS — M129 Arthropathy, unspecified: Secondary | ICD-10-CM | POA: Insufficient documentation

## 2023-05-09 DIAGNOSIS — Z923 Personal history of irradiation: Secondary | ICD-10-CM | POA: Insufficient documentation

## 2023-05-09 DIAGNOSIS — Z5111 Encounter for antineoplastic chemotherapy: Secondary | ICD-10-CM | POA: Insufficient documentation

## 2023-05-09 DIAGNOSIS — C3411 Malignant neoplasm of upper lobe, right bronchus or lung: Secondary | ICD-10-CM | POA: Insufficient documentation

## 2023-05-09 DIAGNOSIS — Z510 Encounter for antineoplastic radiation therapy: Secondary | ICD-10-CM | POA: Diagnosis not present

## 2023-05-09 DIAGNOSIS — Z79890 Hormone replacement therapy (postmenopausal): Secondary | ICD-10-CM | POA: Insufficient documentation

## 2023-05-09 DIAGNOSIS — J449 Chronic obstructive pulmonary disease, unspecified: Secondary | ICD-10-CM | POA: Insufficient documentation

## 2023-05-09 DIAGNOSIS — Z9221 Personal history of antineoplastic chemotherapy: Secondary | ICD-10-CM | POA: Insufficient documentation

## 2023-05-09 LAB — CBC WITH DIFFERENTIAL (CANCER CENTER ONLY)
Abs Immature Granulocytes: 0.03 10*3/uL (ref 0.00–0.07)
Basophils Absolute: 0 10*3/uL (ref 0.0–0.1)
Basophils Relative: 1 %
Eosinophils Absolute: 0.1 10*3/uL (ref 0.0–0.5)
Eosinophils Relative: 2 %
HCT: 37.3 % (ref 36.0–46.0)
Hemoglobin: 11.8 g/dL — ABNORMAL LOW (ref 12.0–15.0)
Immature Granulocytes: 1 %
Lymphocytes Relative: 23 %
Lymphs Abs: 1.2 10*3/uL (ref 0.7–4.0)
MCH: 32.8 pg (ref 26.0–34.0)
MCHC: 31.6 g/dL (ref 30.0–36.0)
MCV: 103.6 fL — ABNORMAL HIGH (ref 80.0–100.0)
Monocytes Absolute: 0.3 10*3/uL (ref 0.1–1.0)
Monocytes Relative: 6 %
Neutro Abs: 3.5 10*3/uL (ref 1.7–7.7)
Neutrophils Relative %: 67 %
Platelet Count: 179 10*3/uL (ref 150–400)
RBC: 3.6 MIL/uL — ABNORMAL LOW (ref 3.87–5.11)
RDW: 12.8 % (ref 11.5–15.5)
WBC Count: 5.2 10*3/uL (ref 4.0–10.5)
nRBC: 0 % (ref 0.0–0.2)

## 2023-05-09 LAB — RAD ONC ARIA SESSION SUMMARY
Course Elapsed Days: 7
Plan Fractions Treated to Date: 5
Plan Prescribed Dose Per Fraction: 2 Gy
Plan Total Fractions Prescribed: 30
Plan Total Prescribed Dose: 60 Gy
Reference Point Dosage Given to Date: 10 Gy
Reference Point Session Dosage Given: 2 Gy
Session Number: 5

## 2023-05-09 LAB — CMP (CANCER CENTER ONLY)
ALT: 42 U/L (ref 0–44)
AST: 47 U/L — ABNORMAL HIGH (ref 15–41)
Albumin: 4 g/dL (ref 3.5–5.0)
Alkaline Phosphatase: 88 U/L (ref 38–126)
Anion gap: 3 — ABNORMAL LOW (ref 5–15)
BUN: 16 mg/dL (ref 8–23)
CO2: 37 mmol/L — ABNORMAL HIGH (ref 22–32)
Calcium: 9.7 mg/dL (ref 8.9–10.3)
Chloride: 99 mmol/L (ref 98–111)
Creatinine: 0.56 mg/dL (ref 0.44–1.00)
GFR, Estimated: >60 mL/min (ref >60)
Glucose, Bld: 95 mg/dL (ref 70–99)
Potassium: 5.6 mmol/L — ABNORMAL HIGH (ref 3.5–5.1)
Sodium: 139 mmol/L (ref 135–145)
Total Bilirubin: 0.4 mg/dL (ref 0.0–1.2)
Total Protein: 7.2 g/dL (ref 6.5–8.1)

## 2023-05-09 MED ORDER — SODIUM CHLORIDE 0.9 % IV SOLN
131.4000 mg | Freq: Once | INTRAVENOUS | Status: AC
  Administered 2023-05-09: 130 mg via INTRAVENOUS
  Filled 2023-05-09: qty 13

## 2023-05-09 MED ORDER — FAMOTIDINE IN NACL 20-0.9 MG/50ML-% IV SOLN
20.0000 mg | Freq: Once | INTRAVENOUS | Status: AC
  Administered 2023-05-09: 20 mg via INTRAVENOUS
  Filled 2023-05-09: qty 50

## 2023-05-09 MED ORDER — SODIUM CHLORIDE 0.9 % IV SOLN: INTRAVENOUS | Status: DC

## 2023-05-09 MED ORDER — SODIUM CHLORIDE 0.9 % IV SOLN
45.0000 mg/m2 | Freq: Once | INTRAVENOUS | Status: AC
  Administered 2023-05-09: 78 mg via INTRAVENOUS
  Filled 2023-05-09: qty 13

## 2023-05-09 MED ORDER — PALONOSETRON HCL INJECTION 0.25 MG/5ML
0.2500 mg | Freq: Once | INTRAVENOUS | Status: AC
  Administered 2023-05-09: 0.25 mg via INTRAVENOUS
  Filled 2023-05-09: qty 5

## 2023-05-09 MED ORDER — DEXAMETHASONE SODIUM PHOSPHATE 10 MG/ML IJ SOLN
10.0000 mg | Freq: Once | INTRAMUSCULAR | Status: AC
  Administered 2023-05-09: 10 mg via INTRAVENOUS
  Filled 2023-05-09: qty 1

## 2023-05-09 MED ORDER — DIPHENHYDRAMINE HCL 50 MG/ML IJ SOLN
25.0000 mg | Freq: Once | INTRAMUSCULAR | Status: AC
  Administered 2023-05-09: 25 mg via INTRAVENOUS
  Filled 2023-05-09: qty 1

## 2023-05-09 NOTE — Patient Instructions (Signed)

## 2023-05-10 ENCOUNTER — Ambulatory Visit: Payer: Medicare Other

## 2023-05-10 ENCOUNTER — Ambulatory Visit
Admission: RE | Admit: 2023-05-10 | Discharge: 2023-05-10 | Disposition: A | Discharge status: Home / Self Care | Payer: Medicare Other | Source: Ambulatory Visit | Attending: Radiation Oncology | Admitting: Radiation Oncology

## 2023-05-10 ENCOUNTER — Other Ambulatory Visit: Payer: Self-pay

## 2023-05-10 DIAGNOSIS — Z510 Encounter for antineoplastic radiation therapy: Secondary | ICD-10-CM | POA: Diagnosis not present

## 2023-05-10 LAB — RAD ONC ARIA SESSION SUMMARY
Course Elapsed Days: 8
Plan Fractions Treated to Date: 6
Plan Prescribed Dose Per Fraction: 2 Gy
Plan Total Fractions Prescribed: 30
Plan Total Prescribed Dose: 60 Gy
Reference Point Dosage Given to Date: 12 Gy
Reference Point Session Dosage Given: 2 Gy
Session Number: 6

## 2023-05-11 ENCOUNTER — Other Ambulatory Visit: Payer: Self-pay

## 2023-05-11 ENCOUNTER — Ambulatory Visit
Admission: RE | Admit: 2023-05-11 | Discharge: 2023-05-11 | Disposition: A | Discharge status: Home / Self Care | Payer: Medicare Other | Source: Ambulatory Visit | Attending: Radiation Oncology | Admitting: Radiation Oncology

## 2023-05-11 DIAGNOSIS — Z510 Encounter for antineoplastic radiation therapy: Secondary | ICD-10-CM | POA: Diagnosis not present

## 2023-05-11 LAB — RAD ONC ARIA SESSION SUMMARY
Course Elapsed Days: 9
Plan Fractions Treated to Date: 7
Plan Prescribed Dose Per Fraction: 2 Gy
Plan Total Fractions Prescribed: 30
Plan Total Prescribed Dose: 60 Gy
Reference Point Dosage Given to Date: 14 Gy
Reference Point Session Dosage Given: 2 Gy
Session Number: 7

## 2023-05-12 ENCOUNTER — Other Ambulatory Visit: Payer: Self-pay

## 2023-05-12 ENCOUNTER — Ambulatory Visit
Admission: RE | Admit: 2023-05-12 | Discharge: 2023-05-12 | Disposition: A | Discharge status: Home / Self Care | Payer: Medicare Other | Source: Ambulatory Visit | Attending: Radiation Oncology | Admitting: Radiation Oncology

## 2023-05-12 ENCOUNTER — Ambulatory Visit: Payer: Medicare Other | Admitting: Emergency Medicine

## 2023-05-12 DIAGNOSIS — Z510 Encounter for antineoplastic radiation therapy: Secondary | ICD-10-CM | POA: Diagnosis not present

## 2023-05-12 LAB — RAD ONC ARIA SESSION SUMMARY
Course Elapsed Days: 10
Plan Fractions Treated to Date: 8
Plan Prescribed Dose Per Fraction: 2 Gy
Plan Total Fractions Prescribed: 30
Plan Total Prescribed Dose: 60 Gy
Reference Point Dosage Given to Date: 16 Gy
Reference Point Session Dosage Given: 2 Gy
Session Number: 8

## 2023-05-13 ENCOUNTER — Ambulatory Visit
Admission: RE | Admit: 2023-05-13 | Discharge: 2023-05-13 | Disposition: A | Discharge status: Home / Self Care | Payer: Medicare Other | Source: Ambulatory Visit | Attending: Radiation Oncology | Admitting: Radiation Oncology

## 2023-05-13 ENCOUNTER — Other Ambulatory Visit: Payer: Self-pay

## 2023-05-13 DIAGNOSIS — Z510 Encounter for antineoplastic radiation therapy: Secondary | ICD-10-CM | POA: Diagnosis not present

## 2023-05-13 LAB — RAD ONC ARIA SESSION SUMMARY
Course Elapsed Days: 11
Plan Fractions Treated to Date: 9
Plan Prescribed Dose Per Fraction: 2 Gy
Plan Total Fractions Prescribed: 30
Plan Total Prescribed Dose: 60 Gy
Reference Point Dosage Given to Date: 18 Gy
Reference Point Session Dosage Given: 2 Gy
Session Number: 9

## 2023-05-14 NOTE — Progress Notes (Signed)
 [facility] OFFICE PROGRESS NOTE  Trudy Mliss Dragon, MD [address] [place] [address]  DIAGNOSIS: Stage IV (T2b, N0, M1 a) non-small cell lung cancer with unclear subtype on the pathology report diagnosed in November 2022 and presented with right upper lobe lung mass as well as left upper lobe nodule in addition to right lower lobe nodule. In October 2024 she was found to have suspicious metachronous primary with RLL nodule and subcarinal node vs. Metastatic disease which was biopsied and consistent with Squamous cell carcinoma.    DETECTED ALTERATION(S) / BIOMARKER(S)     % CFDNA OR AMPLIFICATION        ASSOCIATED FDA-APPROVED THERAPIES         CLINICAL TRIAL AVAILABILITY [id number] 0.1% NoneNo   PD-L1 expression 1%  PRIOR THERAPY: SBRT to the left and right lung nodules under the care of Dr. Shannon completed on June 24, 2021.   CURRENT THERAPY: Concurrent chemoradiation with carboplatin  for an AUC of 2 paclitaxel  45 mg/m. First dose expected on 05/02/2023. Status post 2 cycles.    INTERVAL HISTORY: Amy Sandoval 89 y.o. female returns to the clinic today for a follow up visit. The patient was found to have disease recurrence. Therefore, she is currently undergoing a course of concurrent chemoradiation. She is status post 2 weeks of treatment. She has tolerated it well without any appreciable adverse side effects. Her last day of radiation is scheduled for 06/13/23. Denies any fever, chills, night sweats, or unexplained weight loss. She reports a good appetite. Denies any nausea, vomiting, diarrhea, or constipation.  He has baseline dyspnea on exertion for which she is on 3 L of supplemental oxygen at baseline.  Denies any cough, hemoptysis, or chest pain.  Denies any headache or visual changes.  She is here today for evaluation and repeat blood work before undergoing cycle #3.   MEDICAL HISTORY: Past Medical History:  Diagnosis Date   Arthritis    COPD  (chronic obstructive pulmonary disease) (HCC)    COPD with chronic hypoxic respiratory failure (HCC) 08/17/2022   First diagnosis 12/2020 upon admission for multifocal pneumonia   [place] [facility]  PFTs 2022 confirmed obstruction and mild reduction in DLCO     History of radiation therapy    RUL lung and LLL lung 06/15/2021-06/24/2021  Dr Lynwood Shannon   Hx of cervical spine fracture 2/2 fall managed with neck brace 08/20/2022   Hx of herpes zoster infection of R thoracic region; no neuropathic pain    Non-small cell lung cancer (NSCLC) (HCC) 12/2020   Pneumonia    Rhinorrhea, chronic right unilateral 02/17/2023   Sepsis (HCC) 12/18/2020    ALLERGIES:  is allergic to penicillins.  MEDICATIONS:  Current Outpatient Medications  Medication Sig Dispense Refill   ipratropium (ATROVENT ) 0.06 % nasal spray Place 2 sprays into both nostrils 2 (two) times daily as needed for rhinitis. 15 mL 5   levothyroxine  (SYNTHROID ) 25 MCG tablet Take 1 tablet (25 mcg total) by mouth daily before breakfast. 30 tablet 5   naproxen sodium (ALEVE) 220 MG tablet Take 220 mg by mouth as needed. (Patient not taking: Reported on 04/22/2023)     OXYGEN Inhale 3 L into the lungs as needed. When 02 sats drop below 92     prochlorperazine  (COMPAZINE ) 10 MG tablet Take 1 tablet (10 mg total) by mouth every 6 (six) hours as needed. 30 tablet 2   No current facility-administered medications for this visit.    SURGICAL HISTORY:  Past Surgical History:  Procedure Laterality Date   BRONCHIAL BIOPSY  03/09/2021   Procedure: BRONCHIAL BIOPSIES;  Surgeon: Shelah Lamar RAMAN, MD;  Location: [facility] ENDOSCOPY;  Service: Pulmonary;;   BRONCHIAL BIOPSY  04/04/2023   Procedure: BRONCHIAL BIOPSIES;  Surgeon: Shelah Lamar RAMAN, MD;  Location: [facility] ENDOSCOPY;  Service: Pulmonary;;   BRONCHIAL BRUSHINGS  03/09/2021   Procedure: BRONCHIAL BRUSHINGS;  Surgeon: Shelah Lamar RAMAN, MD;  Location: [facility] ENDOSCOPY;  Service: Pulmonary;;   BRONCHIAL BRUSHINGS   04/04/2023   Procedure: BRONCHIAL BRUSHINGS;  Surgeon: Shelah Lamar RAMAN, MD;  Location: [facility] ENDOSCOPY;  Service: Pulmonary;;   BRONCHIAL NEEDLE ASPIRATION BIOPSY  03/09/2021   Procedure: BRONCHIAL NEEDLE ASPIRATION BIOPSIES;  Surgeon: Shelah Lamar RAMAN, MD;  Location: [facility] ENDOSCOPY;  Service: Pulmonary;;   BRONCHIAL NEEDLE ASPIRATION BIOPSY  04/04/2023   Procedure: BRONCHIAL NEEDLE ASPIRATION BIOPSIES;  Surgeon: Shelah Lamar RAMAN, MD;  Location: [facility] ENDOSCOPY;  Service: Pulmonary;;   DILATION AND CURETTAGE OF UTERUS     ENDOBRONCHIAL ULTRASOUND Right 04/04/2023   Procedure: ENDOBRONCHIAL ULTRASOUND WITH FLURO;  Surgeon: Shelah Lamar RAMAN, MD;  Location: [facility] ENDOSCOPY;  Service: Pulmonary;  Laterality: Right;   EYE SURGERY     bilateral cataract removal   FIDUCIAL MARKER PLACEMENT  04/04/2023   Procedure: FIDUCIAL MARKER PLACEMENT;  Surgeon: Shelah Lamar RAMAN, MD;  Location: [facility] ENDOSCOPY;  Service: Pulmonary;;   TONSILLECTOMY     VIDEO BRONCHOSCOPY WITH ENDOBRONCHIAL NAVIGATION Bilateral 03/09/2021   Procedure: VIDEO BRONCHOSCOPY WITH ENDOBRONCHIAL NAVIGATION;  Surgeon: Shelah Lamar RAMAN, MD;  Location: [facility] ENDOSCOPY;  Service: Pulmonary;  Laterality: Bilateral;  ION w/ possible fiducial    REVIEW OF SYSTEMS:   Constitutional: Positive for stable fatigue. Negative for appetite change, chills,  fever and unexpected weight change.  HENT: Negative for mouth sores, nosebleeds, sore throat and trouble swallowing.   Eyes: Negative for eye problems and icterus.  Respiratory: Positive for baseline dyspnea on exertion. Negative for cough, hemoptysis, and wheezing.   Cardiovascular: Negative for chest pain and leg swelling.  Gastrointestinal: Negative for abdominal pain, constipation, diarrhea, nausea and vomiting.  Genitourinary: Negative for bladder incontinence, difficulty urinating, dysuria, frequency and hematuria.   Musculoskeletal: Negative for back pain, gait problem, neck pain and neck stiffness.  Skin: Negative  for itching and rash.  Neurological: Negative for dizziness, extremity weakness, gait problem, headaches, light-headedness and seizures.  Hematological: Negative for adenopathy. Does not bruise/bleed easily.  Psychiatric/Behavioral: Negative for confusion, depression and sleep disturbance. The patient is not nervous/anxious.   PHYSICAL EXAMINATION:  There were no vitals taken for this visit.  ECOG PERFORMANCE STATUS: 2  Physical Exam  Constitutional: Oriented to person, place, and time and elderly appearing female, and in no distress.  HENT:  Head: Normocephalic and atraumatic.  Mouth/Throat: Oropharynx is clear and moist. No oropharyngeal exudate.  Eyes: Conjunctivae are normal. Right eye exhibits no discharge. Left eye exhibits no discharge. No scleral icterus.  Neck: Normal range of motion. Neck supple.  Cardiovascular: Normal rate, regular rhythm, normal heart sounds and intact distal pulses.   Pulmonary/Chest: Effort normal. Quiet breath sounds bilaterally. No respiratory distress. No wheezes. No rales. On supplemental oxygen via nasa cannula.  Abdominal: Soft. Bowel sounds are normal. Exhibits no distension and no mass. There is no tenderness.  Musculoskeletal: Normal range of motion. Exhibits no edema.  Lymphadenopathy:    No cervical adenopathy.  Neurological: Alert and oriented to person, place, and time. Exhibits muscle wasting. She was examined in the wheelchair.  Skin: Skin is  warm and dry. No rash noted. Not diaphoretic. No erythema. No pallor.  Psychiatric: Mood, memory and judgment normal.  Vitals reviewed.  LABORATORY DATA: Lab Results  Component Value Date   WBC 5.2 05/09/2023   HGB 11.8 (L) 05/09/2023   HCT 37.3 05/09/2023   MCV 103.6 (H) 05/09/2023   PLT 179 05/09/2023      Chemistry      Component Value Date/Time   NA 139 05/09/2023 1009   K 5.6 (H) 05/09/2023 1009   CL 99 05/09/2023 1009   CO2 37 (H) 05/09/2023 1009   BUN 16 05/09/2023 1009    CREATININE 0.56 05/09/2023 1009      Component Value Date/Time   CALCIUM 9.7 05/09/2023 1009   ALKPHOS 88 05/09/2023 1009   AST 47 (H) 05/09/2023 1009   ALT 42 05/09/2023 1009   BILITOT 0.4 05/09/2023 1009       RADIOGRAPHIC STUDIES:  MR Brain W Wo Contrast Result Date: 04/29/2023 CLINICAL DATA:  Non-small cell lung cancer, metastatic disease evaluation EXAM: MRI HEAD WITHOUT AND WITH CONTRAST TECHNIQUE: Multiplanar, multiecho pulse sequences of the brain and surrounding structures were obtained without and with intravenous contrast. CONTRAST:  6mL GADAVIST  GADOBUTROL  1 MMOL/ML IV SOLN COMPARISON:  03/17/2021 MRI head FINDINGS: Brain: No restricted diffusion to suggest acute or subacute infarct. No abnormal parenchymal or meningeal enhancement. no acute hemorrhage, mass, mass effect, or midline shift. No hydrocephalus or extra-axial collection. Pituitary and craniocervical junction within normal limits. No hemosiderin deposition to suggest remote hemorrhage. Normal cerebral volume for age. Redemonstrated T2 hyperintense, T1 hypointense signal in the medial left frontal lobe, which was previously suspected to be cortical but now appears to be in the subcortical white matter (series 8, image 16 and series 9, image 32). This is not associated with abnormal enhancement and appears overall unchanged compared to the prior exam. Vascular: Normal arterial flow voids. Normal arterial and venous enhancement. Skull and upper cervical spine: Normal marrow signal. Sinuses/Orbits: Mild mucosal thickening in the ethmoid air cells. Status post bilateral lens replacements. No acute finding in the orbits. Other: The mastoid air cells are well aerated. IMPRESSION: 1. No evidence of intracranial metastatic disease. 2. Redemonstrated T2 hyperintense, T1 hypointense, nonenhancing lesion in the subcortical medial left frontal lobe, which appears overall unchanged compared to 2022. 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 Attention on follow-up. Electronically Signed   By: Donald Campion M.D.   On: 04/29/2023 02:01     ASSESSMENT/PLAN:  This is a very pleasant 89 year old Caucasian female with stage IV (T2b, N0, M1 a) non-small cell lung cancer.  She was diagnosed in November 2022 and presented with a right upper lobe lung mass and an adjacent nodule in addition to a synchronous primary versus Amy Sandoval a metastatic nodule in the left lower lobe.  She was diagnosed in November 2022.  She has no actionable mutations.   In October 2024 she was found to have suspicious metachronous primary with RLL nodule and subcarinal node vs. Metastatic disease.    The patient underwent SBRT to these lesions under the care of Dr. Shannon. This was completed on 06/24/2021   She was on observation until restaging CT scan that showed enlargement of right lower lobe pulmonary nodule highly suspicious for metachronous primary or metastatic disease. There was also developing thoracic adenopathy highly suspicious for nodal metastasis.    The patient had a PET scan to further evaluate  this. The scan showed a 9 mm nodule in the right lung base which is suspicious for metastasis versus metachronous primary.  There is also a 10 mm subcarinal nodule suspicious for nodal recurrence.   She underwent biopsy and this was consistent with squamous cell carcinoma.   She is currently undergoing concurrent chemoradiation with carboplatin  for an AUC of 2 and Taxol  45 mg/m. She is status post 2 cycles and tolerating well  Labs were reviewed. Recoomend she proceed with cycle #3 today as schedule.   We will see her back for labs and follow up in 2 weeks before undergoing cycle #5.   The patient was advised to call immediately if she has any concerning symptoms in the interval. The patient voices understanding of current disease status and treatment options and  is in agreement with the current care plan. All questions were answered. The patient knows to call the clinic with any problems, questions or concerns. We can certainly see the patient much sooner if necessary      No orders of the defined types were placed in this encounter.    The total time spent in the appointment was 20-29 minutes  Anjanae Woehrle L Jakylah Bassinger, PA-C 05/14/23

## 2023-05-16 ENCOUNTER — Other Ambulatory Visit: Payer: Self-pay

## 2023-05-16 ENCOUNTER — Ambulatory Visit
Admission: RE | Admit: 2023-05-16 | Discharge: 2023-05-16 | Disposition: A | Discharge status: Home / Self Care | Payer: Medicare Other | Source: Ambulatory Visit | Attending: Radiation Oncology | Admitting: Radiation Oncology

## 2023-05-16 DIAGNOSIS — Z510 Encounter for antineoplastic radiation therapy: Secondary | ICD-10-CM | POA: Diagnosis not present

## 2023-05-16 LAB — RAD ONC ARIA SESSION SUMMARY
Course Elapsed Days: 14
Plan Fractions Treated to Date: 10
Plan Prescribed Dose Per Fraction: 2 Gy
Plan Total Fractions Prescribed: 30
Plan Total Prescribed Dose: 60 Gy
Reference Point Dosage Given to Date: 20 Gy
Reference Point Session Dosage Given: 2 Gy
Session Number: 10

## 2023-05-17 ENCOUNTER — Inpatient Hospital Stay: Payer: Medicare Other

## 2023-05-17 ENCOUNTER — Inpatient Hospital Stay (HOSPITAL_BASED_OUTPATIENT_CLINIC_OR_DEPARTMENT_OTHER): Payer: Medicare Other | Admitting: Physician Assistant

## 2023-05-17 ENCOUNTER — Ambulatory Visit: Payer: Medicare Other

## 2023-05-17 ENCOUNTER — Ambulatory Visit
Admission: RE | Admit: 2023-05-17 | Discharge: 2023-05-17 | Disposition: A | Discharge status: Home / Self Care | Payer: Medicare Other | Source: Ambulatory Visit | Attending: Radiation Oncology | Admitting: Radiation Oncology

## 2023-05-17 ENCOUNTER — Other Ambulatory Visit: Payer: Self-pay

## 2023-05-17 VITALS — BP 140/63 | HR 67 | Temp 97.3°F | Resp 18

## 2023-05-17 DIAGNOSIS — Z5111 Encounter for antineoplastic chemotherapy: Secondary | ICD-10-CM | POA: Insufficient documentation

## 2023-05-17 DIAGNOSIS — Z510 Encounter for antineoplastic radiation therapy: Secondary | ICD-10-CM | POA: Diagnosis not present

## 2023-05-17 DIAGNOSIS — C3411 Malignant neoplasm of upper lobe, right bronchus or lung: Secondary | ICD-10-CM

## 2023-05-17 LAB — RAD ONC ARIA SESSION SUMMARY
Course Elapsed Days: 15
Plan Fractions Treated to Date: 11
Plan Prescribed Dose Per Fraction: 2 Gy
Plan Total Fractions Prescribed: 30
Plan Total Prescribed Dose: 60 Gy
Reference Point Dosage Given to Date: 22 Gy
Reference Point Session Dosage Given: 2 Gy
Session Number: 11

## 2023-05-17 LAB — CBC WITH DIFFERENTIAL (CANCER CENTER ONLY)
Abs Immature Granulocytes: 0.01 10*3/uL (ref 0.00–0.07)
Basophils Absolute: 0 10*3/uL (ref 0.0–0.1)
Basophils Relative: 1 %
Eosinophils Absolute: 0.1 10*3/uL (ref 0.0–0.5)
Eosinophils Relative: 3 %
HCT: 32.7 % — ABNORMAL LOW (ref 36.0–46.0)
Hemoglobin: 10.3 g/dL — ABNORMAL LOW (ref 12.0–15.0)
Immature Granulocytes: 0 %
Lymphocytes Relative: 31 %
Lymphs Abs: 1 10*3/uL (ref 0.7–4.0)
MCH: 32.6 pg (ref 26.0–34.0)
MCHC: 31.5 g/dL (ref 30.0–36.0)
MCV: 103.5 fL — ABNORMAL HIGH (ref 80.0–100.0)
Monocytes Absolute: 0.4 10*3/uL (ref 0.1–1.0)
Monocytes Relative: 11 %
Neutro Abs: 1.8 10*3/uL (ref 1.7–7.7)
Neutrophils Relative %: 54 %
Platelet Count: 144 10*3/uL — ABNORMAL LOW (ref 150–400)
RBC: 3.16 MIL/uL — ABNORMAL LOW (ref 3.87–5.11)
RDW: 13 % (ref 11.5–15.5)
WBC Count: 3.3 10*3/uL — ABNORMAL LOW (ref 4.0–10.5)
nRBC: 0 % (ref 0.0–0.2)

## 2023-05-17 LAB — CMP (CANCER CENTER ONLY)
ALT: 28 U/L (ref 0–44)
AST: 32 U/L (ref 15–41)
Albumin: 3.7 g/dL (ref 3.5–5.0)
Alkaline Phosphatase: 78 U/L (ref 38–126)
Anion gap: 3 — ABNORMAL LOW (ref 5–15)
BUN: 14 mg/dL (ref 8–23)
CO2: 36 mmol/L — ABNORMAL HIGH (ref 22–32)
Calcium: 9.1 mg/dL (ref 8.9–10.3)
Chloride: 103 mmol/L (ref 98–111)
Creatinine: 0.56 mg/dL (ref 0.44–1.00)
GFR, Estimated: >60 mL/min (ref >60)
Glucose, Bld: 104 mg/dL — ABNORMAL HIGH (ref 70–99)
Potassium: 4.5 mmol/L (ref 3.5–5.1)
Sodium: 142 mmol/L (ref 135–145)
Total Bilirubin: 0.4 mg/dL (ref 0.0–1.2)
Total Protein: 6.4 g/dL — ABNORMAL LOW (ref 6.5–8.1)

## 2023-05-17 MED ORDER — FAMOTIDINE IN NACL 20-0.9 MG/50ML-% IV SOLN
20.0000 mg | Freq: Once | INTRAVENOUS | Status: AC
  Administered 2023-05-17: 20 mg via INTRAVENOUS
  Filled 2023-05-17: qty 50

## 2023-05-17 MED ORDER — DEXAMETHASONE SODIUM PHOSPHATE 10 MG/ML IJ SOLN
10.0000 mg | Freq: Once | INTRAMUSCULAR | Status: AC
  Administered 2023-05-17: 10 mg via INTRAVENOUS
  Filled 2023-05-17: qty 1

## 2023-05-17 MED ORDER — DIPHENHYDRAMINE HCL 50 MG/ML IJ SOLN
25.0000 mg | Freq: Once | INTRAMUSCULAR | Status: AC
Start: 2023-05-17 — End: 2023-05-17
  Administered 2023-05-17: 25 mg via INTRAVENOUS
  Filled 2023-05-17: qty 1

## 2023-05-17 MED ORDER — SODIUM CHLORIDE 0.9 % IV SOLN
131.4000 mg | Freq: Once | INTRAVENOUS | Status: AC
  Administered 2023-05-17: 130 mg via INTRAVENOUS
  Filled 2023-05-17: qty 13

## 2023-05-17 MED ORDER — SODIUM CHLORIDE 0.9 % IV SOLN
45.0000 mg/m2 | Freq: Once | INTRAVENOUS | Status: AC
  Administered 2023-05-17: 78 mg via INTRAVENOUS
  Filled 2023-05-17: qty 13

## 2023-05-17 MED ORDER — SODIUM CHLORIDE 0.9 % IV SOLN: INTRAVENOUS | Status: DC

## 2023-05-17 MED ORDER — PALONOSETRON HCL INJECTION 0.25 MG/5ML
0.2500 mg | Freq: Once | INTRAVENOUS | Status: AC
  Administered 2023-05-17: 0.25 mg via INTRAVENOUS
  Filled 2023-05-17: qty 5

## 2023-05-17 NOTE — Patient Instructions (Signed)
 [facility] - A DEPT OF [facility]  Discharge Instructions: Thank you for choosing [facility] to provide your oncology and hematology care.   If you have a lab appointment with the Cancer Center, please go directly to the Cancer Center and check in at the registration area.   Wear comfortable clothing and clothing appropriate for easy access to any Portacath or PICC line.   We strive to give you quality time with your provider. You may need to reschedule your appointment if you arrive late (15 or more minutes).  Arriving late affects you and other patients whose appointments are after yours.  Also, if you miss three or more appointments without notifying the office, you may be dismissed from the clinic at the provider's discretion.      For prescription refill requests, have your pharmacy contact our office and allow 72 hours for refills to be completed.    Today you received the following chemotherapy and/or immunotherapy agents paclitaxel , carboplatin       To help prevent nausea and vomiting after your treatment, we encourage you to take your nausea medication as directed.  BELOW ARE SYMPTOMS THAT SHOULD BE REPORTED IMMEDIATELY: *FEVER GREATER THAN 100.4 F (38 C) OR HIGHER *CHILLS OR SWEATING *NAUSEA AND VOMITING THAT IS NOT CONTROLLED WITH YOUR NAUSEA MEDICATION *UNUSUAL SHORTNESS OF BREATH *UNUSUAL BRUISING OR BLEEDING *URINARY PROBLEMS (pain or burning when urinating, or frequent urination) *BOWEL PROBLEMS (unusual diarrhea, constipation, pain near the anus) TENDERNESS IN MOUTH AND THROAT WITH OR WITHOUT PRESENCE OF ULCERS (sore throat, sores in mouth, or a toothache) UNUSUAL RASH, SWELLING OR PAIN  UNUSUAL VAGINAL DISCHARGE OR ITCHING   Items with * indicate a potential emergency and should be followed up as soon as possible or go to the Emergency Department if any problems should occur.  Please show the CHEMOTHERAPY ALERT CARD or  IMMUNOTHERAPY ALERT CARD at check-in to the Emergency Department and triage nurse.  Should you have questions after your visit or need to cancel or reschedule your appointment, please contact [facility] - A DEPT OF [facility]  Dept: [phone]  and follow the prompts.  Office hours are 8:00 a.m. to 4:30 p.m. Monday - Friday. Please note that voicemails left after 4:00 p.m. may not be returned until the following business day.  We are closed weekends and major holidays. You have access to a nurse at all times for urgent questions. Please call the main number to the clinic Dept: [phone] and follow the prompts.   For any non-urgent questions, you may also contact your provider using MyChart. We now offer e-Visits for anyone 25 and older to request care online for non-urgent symptoms. For details visit [email or web address].   Also download the MyChart app! Go to the app store, search MyChart, open the app, select [place], and log in with your MyChart username and password.

## 2023-05-18 ENCOUNTER — Ambulatory Visit
Admission: RE | Admit: 2023-05-18 | Discharge: 2023-05-18 | Disposition: A | Discharge status: Home / Self Care | Payer: Medicare Other | Source: Ambulatory Visit | Attending: Radiation Oncology | Admitting: Radiation Oncology

## 2023-05-18 ENCOUNTER — Other Ambulatory Visit: Payer: Self-pay

## 2023-05-18 DIAGNOSIS — Z510 Encounter for antineoplastic radiation therapy: Secondary | ICD-10-CM | POA: Diagnosis not present

## 2023-05-18 LAB — RAD ONC ARIA SESSION SUMMARY
Course Elapsed Days: 16
Plan Fractions Treated to Date: 12
Plan Prescribed Dose Per Fraction: 2 Gy
Plan Total Fractions Prescribed: 30
Plan Total Prescribed Dose: 60 Gy
Reference Point Dosage Given to Date: 24 Gy
Reference Point Session Dosage Given: 2 Gy
Session Number: 12

## 2023-05-19 ENCOUNTER — Ambulatory Visit
Admission: RE | Admit: 2023-05-19 | Discharge: 2023-05-19 | Disposition: A | Discharge status: Home / Self Care | Payer: Medicare Other | Source: Ambulatory Visit | Attending: Radiation Oncology | Admitting: Radiation Oncology

## 2023-05-19 ENCOUNTER — Other Ambulatory Visit: Payer: Self-pay

## 2023-05-19 DIAGNOSIS — Z510 Encounter for antineoplastic radiation therapy: Secondary | ICD-10-CM | POA: Diagnosis not present

## 2023-05-19 DIAGNOSIS — C3411 Malignant neoplasm of upper lobe, right bronchus or lung: Secondary | ICD-10-CM

## 2023-05-19 LAB — RAD ONC ARIA SESSION SUMMARY
Course Elapsed Days: 17
Plan Fractions Treated to Date: 13
Plan Prescribed Dose Per Fraction: 2 Gy
Plan Total Fractions Prescribed: 30
Plan Total Prescribed Dose: 60 Gy
Reference Point Dosage Given to Date: 26 Gy
Reference Point Session Dosage Given: 2 Gy
Session Number: 13

## 2023-05-20 ENCOUNTER — Ambulatory Visit
Admission: RE | Admit: 2023-05-20 | Discharge: 2023-05-20 | Disposition: A | Discharge status: Home / Self Care | Payer: Medicare Other | Source: Ambulatory Visit | Attending: Radiation Oncology | Admitting: Radiation Oncology

## 2023-05-20 ENCOUNTER — Other Ambulatory Visit: Payer: Self-pay

## 2023-05-20 DIAGNOSIS — Z510 Encounter for antineoplastic radiation therapy: Secondary | ICD-10-CM | POA: Diagnosis not present

## 2023-05-20 LAB — RAD ONC ARIA SESSION SUMMARY
Course Elapsed Days: 18
Plan Fractions Treated to Date: 14
Plan Prescribed Dose Per Fraction: 2 Gy
Plan Total Fractions Prescribed: 30
Plan Total Prescribed Dose: 60 Gy
Reference Point Dosage Given to Date: 28 Gy
Reference Point Session Dosage Given: 2 Gy
Session Number: 14

## 2023-05-23 ENCOUNTER — Inpatient Hospital Stay: Payer: Medicare Other

## 2023-05-23 ENCOUNTER — Other Ambulatory Visit: Payer: Self-pay

## 2023-05-23 ENCOUNTER — Ambulatory Visit
Admission: RE | Admit: 2023-05-23 | Discharge: 2023-05-23 | Disposition: A | Discharge status: Home / Self Care | Payer: Medicare Other | Source: Ambulatory Visit | Attending: Radiation Oncology | Admitting: Radiation Oncology

## 2023-05-23 VITALS — BP 150/54 | HR 76 | Temp 98.3°F | Resp 16 | Wt 140.2 lb

## 2023-05-23 DIAGNOSIS — Z510 Encounter for antineoplastic radiation therapy: Secondary | ICD-10-CM | POA: Diagnosis not present

## 2023-05-23 DIAGNOSIS — C3411 Malignant neoplasm of upper lobe, right bronchus or lung: Secondary | ICD-10-CM

## 2023-05-23 LAB — CBC WITH DIFFERENTIAL (CANCER CENTER ONLY)
Abs Immature Granulocytes: 0.02 10*3/uL (ref 0.00–0.07)
Basophils Absolute: 0 10*3/uL (ref 0.0–0.1)
Basophils Relative: 0 %
Eosinophils Absolute: 0 10*3/uL (ref 0.0–0.5)
Eosinophils Relative: 1 %
HCT: 34.9 % — ABNORMAL LOW (ref 36.0–46.0)
Hemoglobin: 11 g/dL — ABNORMAL LOW (ref 12.0–15.0)
Immature Granulocytes: 1 %
Lymphocytes Relative: 23 %
Lymphs Abs: 0.8 10*3/uL (ref 0.7–4.0)
MCH: 33.1 pg (ref 26.0–34.0)
MCHC: 31.5 g/dL (ref 30.0–36.0)
MCV: 105.1 fL — ABNORMAL HIGH (ref 80.0–100.0)
Monocytes Absolute: 0.3 10*3/uL (ref 0.1–1.0)
Monocytes Relative: 9 %
Neutro Abs: 2.3 10*3/uL (ref 1.7–7.7)
Neutrophils Relative %: 66 %
Platelet Count: 153 10*3/uL (ref 150–400)
RBC: 3.32 MIL/uL — ABNORMAL LOW (ref 3.87–5.11)
RDW: 13.4 % (ref 11.5–15.5)
WBC Count: 3.5 10*3/uL — ABNORMAL LOW (ref 4.0–10.5)
nRBC: 0 % (ref 0.0–0.2)

## 2023-05-23 LAB — RAD ONC ARIA SESSION SUMMARY
Course Elapsed Days: 21
Plan Fractions Treated to Date: 15
Plan Prescribed Dose Per Fraction: 2 Gy
Plan Total Fractions Prescribed: 30
Plan Total Prescribed Dose: 60 Gy
Reference Point Dosage Given to Date: 30 Gy
Reference Point Session Dosage Given: 2 Gy
Session Number: 15

## 2023-05-23 LAB — CMP (CANCER CENTER ONLY)
ALT: 21 U/L (ref 0–44)
AST: 27 U/L (ref 15–41)
Albumin: 3.9 g/dL (ref 3.5–5.0)
Alkaline Phosphatase: 78 U/L (ref 38–126)
Anion gap: 4 — ABNORMAL LOW (ref 5–15)
BUN: 14 mg/dL (ref 8–23)
CO2: 35 mmol/L — ABNORMAL HIGH (ref 22–32)
Calcium: 9.3 mg/dL (ref 8.9–10.3)
Chloride: 100 mmol/L (ref 98–111)
Creatinine: 0.55 mg/dL (ref 0.44–1.00)
GFR, Estimated: >60 mL/min (ref >60)
Glucose, Bld: 91 mg/dL (ref 70–99)
Potassium: 4.8 mmol/L (ref 3.5–5.1)
Sodium: 139 mmol/L (ref 135–145)
Total Bilirubin: 0.6 mg/dL (ref 0.0–1.2)
Total Protein: 6.7 g/dL (ref 6.5–8.1)

## 2023-05-23 MED ORDER — CARBOPLATIN CHEMO INJECTION 450 MG/45ML
131.4000 mg | Freq: Once | INTRAVENOUS | Status: AC
  Administered 2023-05-23: 130 mg via INTRAVENOUS
  Filled 2023-05-23: qty 13

## 2023-05-23 MED ORDER — PALONOSETRON HCL INJECTION 0.25 MG/5ML
0.2500 mg | Freq: Once | INTRAVENOUS | Status: AC
  Administered 2023-05-23: 0.25 mg via INTRAVENOUS
  Filled 2023-05-23: qty 5

## 2023-05-23 MED ORDER — DIPHENHYDRAMINE HCL 50 MG/ML IJ SOLN
25.0000 mg | Freq: Once | INTRAMUSCULAR | Status: AC
  Administered 2023-05-23: 25 mg via INTRAVENOUS
  Filled 2023-05-23: qty 1

## 2023-05-23 MED ORDER — SODIUM CHLORIDE 0.9 % IV SOLN
45.0000 mg/m2 | Freq: Once | INTRAVENOUS | Status: AC
  Administered 2023-05-23: 78 mg via INTRAVENOUS
  Filled 2023-05-23: qty 13

## 2023-05-23 MED ORDER — DEXAMETHASONE SODIUM PHOSPHATE 10 MG/ML IJ SOLN
10.0000 mg | Freq: Once | INTRAMUSCULAR | Status: AC
  Administered 2023-05-23: 10 mg via INTRAVENOUS
  Filled 2023-05-23: qty 1

## 2023-05-23 MED ORDER — FAMOTIDINE IN NACL 20-0.9 MG/50ML-% IV SOLN
20.0000 mg | Freq: Once | INTRAVENOUS | Status: AC
  Administered 2023-05-23: 20 mg via INTRAVENOUS
  Filled 2023-05-23: qty 50

## 2023-05-23 MED ORDER — SODIUM CHLORIDE 0.9 % IV SOLN: INTRAVENOUS | Status: DC

## 2023-05-23 NOTE — Patient Instructions (Signed)
 [facility] A DEPT OF [facility]  Discharge Instructions: Thank you for choosing [facility] to provide your oncology and hematology care.   If you have a lab appointment with the Cancer Center, please go directly to the Cancer Center and check in at the registration area.   Wear comfortable clothing and clothing appropriate for easy access to any Portacath or PICC line.   We strive to give you quality time with your provider. You may need to reschedule your appointment if you arrive late (15 or more minutes).  Arriving late affects you and other patients whose appointments are after yours.  Also, if you miss three or more appointments without notifying the office, you may be dismissed from the clinic at the provider's discretion.      For prescription refill requests, have your pharmacy contact our office and allow 72 hours for refills to be completed.    Today you received the following chemotherapy and/or immunotherapy agents Paclitaxel, Carboplatin   To help prevent nausea and vomiting after your treatment, we encourage you to take your nausea medication as directed.  BELOW ARE SYMPTOMS THAT SHOULD BE REPORTED IMMEDIATELY: *FEVER GREATER THAN 100.4 F (38 C) OR HIGHER *CHILLS OR SWEATING *NAUSEA AND VOMITING THAT IS NOT CONTROLLED WITH YOUR NAUSEA MEDICATION *UNUSUAL SHORTNESS OF BREATH *UNUSUAL BRUISING OR BLEEDING *URINARY PROBLEMS (pain or burning when urinating, or frequent urination) *BOWEL PROBLEMS (unusual diarrhea, constipation, pain near the anus) TENDERNESS IN MOUTH AND THROAT WITH OR WITHOUT PRESENCE OF ULCERS (sore throat, sores in mouth, or a toothache) UNUSUAL RASH, SWELLING OR PAIN  UNUSUAL VAGINAL DISCHARGE OR ITCHING   Items with * indicate a potential emergency and should be followed up as soon as possible or go to the Emergency Department if any problems should occur.  Please show the CHEMOTHERAPY ALERT CARD or  IMMUNOTHERAPY ALERT CARD at check-in to the Emergency Department and triage nurse.  Should you have questions after your visit or need to cancel or reschedule your appointment, please contact [facility]  Dept: [phone]  and follow the prompts.  Office hours are 8:00 a.m. to 4:30 p.m. Monday - Friday. Please note that voicemails left after 4:00 p.m. may not be returned until the following business day.  We are closed weekends and major holidays. You have access to a nurse at all times for urgent questions. Please call the main number to the clinic Dept: [phone] and follow the prompts.   For any non-urgent questions, you may also contact your provider using MyChart. We now offer e-Visits for anyone 1 and older to request care online for non-urgent symptoms. For details visit [email or web address].   Also download the MyChart app! Go to the app store, search "MyChart", open the app, select [place], and log in with your MyChart username and password.

## 2023-05-24 ENCOUNTER — Other Ambulatory Visit: Payer: Self-pay

## 2023-05-24 ENCOUNTER — Ambulatory Visit
Admission: RE | Admit: 2023-05-24 | Discharge: 2023-05-24 | Disposition: A | Discharge status: Home / Self Care | Payer: Medicare Other | Source: Ambulatory Visit | Attending: Radiation Oncology | Admitting: Radiation Oncology

## 2023-05-24 DIAGNOSIS — Z510 Encounter for antineoplastic radiation therapy: Secondary | ICD-10-CM | POA: Diagnosis not present

## 2023-05-24 LAB — RAD ONC ARIA SESSION SUMMARY
Course Elapsed Days: 22
Plan Fractions Treated to Date: 16
Plan Prescribed Dose Per Fraction: 2 Gy
Plan Total Fractions Prescribed: 30
Plan Total Prescribed Dose: 60 Gy
Reference Point Dosage Given to Date: 32 Gy
Reference Point Session Dosage Given: 2 Gy
Session Number: 16

## 2023-05-25 ENCOUNTER — Other Ambulatory Visit: Payer: Self-pay

## 2023-05-25 ENCOUNTER — Ambulatory Visit
Admission: RE | Admit: 2023-05-25 | Discharge: 2023-05-25 | Disposition: A | Discharge status: Home / Self Care | Payer: Medicare Other | Source: Ambulatory Visit | Attending: Radiation Oncology | Admitting: Radiation Oncology

## 2023-05-25 DIAGNOSIS — Z510 Encounter for antineoplastic radiation therapy: Secondary | ICD-10-CM | POA: Diagnosis not present

## 2023-05-25 LAB — RAD ONC ARIA SESSION SUMMARY
Course Elapsed Days: 23
Plan Fractions Treated to Date: 17
Plan Prescribed Dose Per Fraction: 2 Gy
Plan Total Fractions Prescribed: 30
Plan Total Prescribed Dose: 60 Gy
Reference Point Dosage Given to Date: 34 Gy
Reference Point Session Dosage Given: 2 Gy
Session Number: 17

## 2023-05-26 ENCOUNTER — Other Ambulatory Visit: Payer: Self-pay

## 2023-05-26 ENCOUNTER — Ambulatory Visit
Admission: RE | Admit: 2023-05-26 | Discharge: 2023-05-26 | Disposition: A | Discharge status: Home / Self Care | Payer: Medicare Other | Source: Ambulatory Visit | Attending: Radiation Oncology | Admitting: Radiation Oncology

## 2023-05-26 DIAGNOSIS — Z510 Encounter for antineoplastic radiation therapy: Secondary | ICD-10-CM | POA: Diagnosis not present

## 2023-05-26 LAB — RAD ONC ARIA SESSION SUMMARY
Course Elapsed Days: 24
Plan Fractions Treated to Date: 18
Plan Prescribed Dose Per Fraction: 2 Gy
Plan Total Fractions Prescribed: 30
Plan Total Prescribed Dose: 60 Gy
Reference Point Dosage Given to Date: 36 Gy
Reference Point Session Dosage Given: 2 Gy
Session Number: 18

## 2023-05-27 ENCOUNTER — Ambulatory Visit
Admission: RE | Admit: 2023-05-27 | Discharge: 2023-05-27 | Disposition: A | Discharge status: Home / Self Care | Payer: Medicare Other | Source: Ambulatory Visit | Attending: Radiation Oncology | Admitting: Radiation Oncology

## 2023-05-27 ENCOUNTER — Other Ambulatory Visit: Payer: Self-pay

## 2023-05-27 DIAGNOSIS — Z510 Encounter for antineoplastic radiation therapy: Secondary | ICD-10-CM | POA: Diagnosis not present

## 2023-05-27 LAB — RAD ONC ARIA SESSION SUMMARY
Course Elapsed Days: 25
Plan Fractions Treated to Date: 19
Plan Prescribed Dose Per Fraction: 2 Gy
Plan Total Fractions Prescribed: 30
Plan Total Prescribed Dose: 60 Gy
Reference Point Dosage Given to Date: 38 Gy
Reference Point Session Dosage Given: 2 Gy
Session Number: 19

## 2023-05-30 ENCOUNTER — Inpatient Hospital Stay: Payer: Medicare Other

## 2023-05-30 ENCOUNTER — Inpatient Hospital Stay (HOSPITAL_BASED_OUTPATIENT_CLINIC_OR_DEPARTMENT_OTHER): Payer: Medicare Other | Admitting: Internal Medicine

## 2023-05-30 ENCOUNTER — Ambulatory Visit
Admission: RE | Admit: 2023-05-30 | Discharge: 2023-05-30 | Disposition: A | Discharge status: Home / Self Care | Payer: Medicare Other | Source: Ambulatory Visit | Attending: Radiation Oncology | Admitting: Radiation Oncology

## 2023-05-30 ENCOUNTER — Other Ambulatory Visit: Payer: Self-pay

## 2023-05-30 VITALS — BP 112/61 | HR 81 | Temp 97.3°F | Resp 16 | Ht 64.0 in | Wt 140.5 lb

## 2023-05-30 VITALS — BP 122/50 | HR 73 | Temp 98.3°F | Resp 20

## 2023-05-30 DIAGNOSIS — C3411 Malignant neoplasm of upper lobe, right bronchus or lung: Secondary | ICD-10-CM

## 2023-05-30 DIAGNOSIS — Z510 Encounter for antineoplastic radiation therapy: Secondary | ICD-10-CM | POA: Diagnosis not present

## 2023-05-30 LAB — RAD ONC ARIA SESSION SUMMARY
Course Elapsed Days: 28
Plan Fractions Treated to Date: 20
Plan Prescribed Dose Per Fraction: 2 Gy
Plan Total Fractions Prescribed: 30
Plan Total Prescribed Dose: 60 Gy
Reference Point Dosage Given to Date: 40 Gy
Reference Point Session Dosage Given: 2 Gy
Session Number: 20

## 2023-05-30 LAB — CBC WITH DIFFERENTIAL (CANCER CENTER ONLY)
Abs Immature Granulocytes: 0.02 10*3/uL (ref 0.00–0.07)
Basophils Absolute: 0 10*3/uL (ref 0.0–0.1)
Basophils Relative: 1 %
Eosinophils Absolute: 0 10*3/uL (ref 0.0–0.5)
Eosinophils Relative: 1 %
HCT: 32.8 % — ABNORMAL LOW (ref 36.0–46.0)
Hemoglobin: 10.5 g/dL — ABNORMAL LOW (ref 12.0–15.0)
Immature Granulocytes: 1 %
Lymphocytes Relative: 26 %
Lymphs Abs: 1 10*3/uL (ref 0.7–4.0)
MCH: 32.7 pg (ref 26.0–34.0)
MCHC: 32 g/dL (ref 30.0–36.0)
MCV: 102.2 fL — ABNORMAL HIGH (ref 80.0–100.0)
Monocytes Absolute: 0.4 10*3/uL (ref 0.1–1.0)
Monocytes Relative: 10 %
Neutro Abs: 2.5 10*3/uL (ref 1.7–7.7)
Neutrophils Relative %: 61 %
Platelet Count: 141 10*3/uL — ABNORMAL LOW (ref 150–400)
RBC: 3.21 MIL/uL — ABNORMAL LOW (ref 3.87–5.11)
RDW: 13.6 % (ref 11.5–15.5)
WBC Count: 4 10*3/uL (ref 4.0–10.5)
nRBC: 0 % (ref 0.0–0.2)

## 2023-05-30 LAB — CMP (CANCER CENTER ONLY)
ALT: 25 U/L (ref 0–44)
AST: 33 U/L (ref 15–41)
Albumin: 4 g/dL (ref 3.5–5.0)
Alkaline Phosphatase: 78 U/L (ref 38–126)
Anion gap: 5 (ref 5–15)
BUN: 14 mg/dL (ref 8–23)
CO2: 35 mmol/L — ABNORMAL HIGH (ref 22–32)
Calcium: 9.6 mg/dL (ref 8.9–10.3)
Chloride: 100 mmol/L (ref 98–111)
Creatinine: 0.59 mg/dL (ref 0.44–1.00)
GFR, Estimated: >60 mL/min (ref >60)
Glucose, Bld: 94 mg/dL (ref 70–99)
Potassium: 4.8 mmol/L (ref 3.5–5.1)
Sodium: 140 mmol/L (ref 135–145)
Total Bilirubin: 0.5 mg/dL (ref 0.0–1.2)
Total Protein: 6.8 g/dL (ref 6.5–8.1)

## 2023-05-30 MED ORDER — PALONOSETRON HCL INJECTION 0.25 MG/5ML
0.2500 mg | Freq: Once | INTRAVENOUS | Status: AC
  Administered 2023-05-30: 0.25 mg via INTRAVENOUS
  Filled 2023-05-30: qty 5

## 2023-05-30 MED ORDER — DEXAMETHASONE SODIUM PHOSPHATE 10 MG/ML IJ SOLN
10.0000 mg | Freq: Once | INTRAMUSCULAR | Status: AC
Start: 2023-05-30 — End: 2023-05-30
  Administered 2023-05-30: 10 mg via INTRAVENOUS
  Filled 2023-05-30: qty 1

## 2023-05-30 MED ORDER — FAMOTIDINE IN NACL 20-0.9 MG/50ML-% IV SOLN
20.0000 mg | Freq: Once | INTRAVENOUS | Status: AC
  Administered 2023-05-30: 20 mg via INTRAVENOUS
  Filled 2023-05-30: qty 50

## 2023-05-30 MED ORDER — SODIUM CHLORIDE 0.9 % IV SOLN
131.4000 mg | Freq: Once | INTRAVENOUS | Status: AC
  Administered 2023-05-30: 130 mg via INTRAVENOUS
  Filled 2023-05-30: qty 13

## 2023-05-30 MED ORDER — SODIUM CHLORIDE 0.9 % IV SOLN
45.0000 mg/m2 | Freq: Once | INTRAVENOUS | Status: AC
  Administered 2023-05-30: 78 mg via INTRAVENOUS
  Filled 2023-05-30: qty 13

## 2023-05-30 MED ORDER — DIPHENHYDRAMINE HCL 50 MG/ML IJ SOLN
25.0000 mg | Freq: Once | INTRAMUSCULAR | Status: AC
  Administered 2023-05-30: 25 mg via INTRAVENOUS
  Filled 2023-05-30: qty 1

## 2023-05-30 MED ORDER — SUCRALFATE 1 G PO TABS
1.0000 g | ORAL_TABLET | Freq: Three times a day (TID) | ORAL | 1 refills | Status: DC
Start: 2023-05-30 — End: 2023-07-14

## 2023-05-30 MED ORDER — SODIUM CHLORIDE 0.9 % IV SOLN: INTRAVENOUS | Status: DC

## 2023-05-30 NOTE — Patient Instructions (Addendum)
[facility]  Discharge Instructions: Thank you for choosing [facility] to provide your oncology and hematology care.   If you have a lab appointment with the Cancer Center, please go directly to the Cancer Center and check in at the registration area.   Wear comfortable clothing and clothing appropriate for easy access to any Portacath or PICC line.   We strive to give you quality time with your provider. You may need to reschedule your appointment if you arrive late (15 or more minutes).  Arriving late affects you and other patients whose appointments are after yours.  Also, if you miss three or more appointments without notifying the office, you may be dismissed from the clinic at the provider's discretion.      For prescription refill requests, have your pharmacy contact our office and allow 72 hours for refills to be completed.    Today you received the following chemotherapy and/or immunotherapy agents:   Paclitaxel, Carboplatin    To help prevent nausea and vomiting after your treatment, we encourage you to take your nausea medication as directed.  BELOW ARE SYMPTOMS THAT SHOULD BE REPORTED IMMEDIATELY: *FEVER GREATER THAN 100.4 F (38 C) OR HIGHER *CHILLS OR SWEATING *NAUSEA AND VOMITING THAT IS NOT CONTROLLED WITH YOUR NAUSEA MEDICATION *UNUSUAL SHORTNESS OF BREATH *UNUSUAL BRUISING OR BLEEDING *URINARY PROBLEMS (pain or burning when urinating, or frequent urination) *BOWEL PROBLEMS (unusual diarrhea, constipation, pain near the anus) TENDERNESS IN MOUTH AND THROAT WITH OR WITHOUT PRESENCE OF ULCERS (sore throat, sores in mouth, or a toothache) UNUSUAL RASH, SWELLING OR PAIN  UNUSUAL VAGINAL DISCHARGE OR ITCHING   Items with * indicate a potential emergency and should be followed up as soon as possible or go to the Emergency Department if any problems should occur.  Please show the CHEMOTHERAPY ALERT CARD or  IMMUNOTHERAPY ALERT CARD at check-in to the Emergency Department and triage nurse.  Should you have questions after your visit or need to cancel or reschedule your appointment, please contact [facility]  Dept: [phone]  and follow the prompts.  Office hours are 8:00 a.m. to 4:30 p.m. Monday - Friday. Please note that voicemails left after 4:00 p.m. may not be returned until the following business day.  We are closed weekends and major holidays. You have access to a nurse at all times for urgent questions. Please call the main number to the clinic Dept: [phone] and follow the prompts.   For any non-urgent questions, you may also contact your provider using MyChart. We now offer e-Visits for anyone 63 and older to request care online for non-urgent symptoms. For details visit [email or web address].   Also download the MyChart app! Go to the app store, search "MyChart", open the app, select [place], and log in with your MyChart username and password.

## 2023-05-30 NOTE — Progress Notes (Signed)
[facility] Telephone:([phone]   Fax:(336) [phone]  OFFICE PROGRESS NOTE  Miguel Aschoff, MD [address] [place] [address]  DIAGNOSIS:  Stage IV (T2b, N0, M1 a) non-small cell lung cancer with unclear subtype on the pathology report diagnosed in November 2022 and presented with right upper lobe lung mass as well as left upper lobe nodule in addition to right lower lobe nodule. In October 2024 she was found to have suspicious metachronous primary with RLL nodule and subcarinal node vs. Metastatic disease which was biopsied and consistent with Squamous cell carcinoma.    DETECTED ALTERATION(S) / BIOMARKER(S)     % CFDNA OR AMPLIFICATION        ASSOCIATED FDA-APPROVED THERAPIES         CLINICAL TRIAL AVAILABILITY [id number] 0.1% NoneNo   PD-L1 expression 1%   PRIOR THERAPY: SBRT to the left and right lung nodules under the care of Dr. Roselind Messier completed on June 24, 2021.    CURRENT THERAPY: Concurrent chemoradiation with carboplatin for an AUC of 2 paclitaxel 45 mg/m.  First dose expected on 05/02/2023 status post 4 cycles  INTERVAL HISTORY: Amy Sandoval 89 y.o. female returns to the clinic today for follow-up visit.Discussed the use of AI scribe software for clinical note transcription with the patient, who gave verbal consent to proceed.  History of Present Illness   The patient, an 89 year old individual with a history of stage 4 non-small cell lung cancer (squamous cell carcinoma), initially presented with right upper lobe and left lower lobe nodules in November 2022. The nodules were treated with SBRT, and the patient did well until a recurrence in December 2024, with mediastinal and lymph node involvement. The patient then began concurrent chemotherapy and radiation, receiving weekly Carboplatin and Paclitaxel, with four cycles completed to date.  The patient reports a new onset of indigestion, which is a change from her baseline. This  symptom is suspected to be a side effect of the radiation treatment, known as radiation-induced esophagitis or adenophagia. The patient denies any exacerbation of her baseline shortness of breath, which is managed with supplemental oxygen. There have been no recent episodes of weight loss; in fact, the patient reports a slight weight gain from 137 to 140.5 pounds.  The patient's blood work has been satisfactory for ongoing treatment, with only mild anemia noted. The patient was not previously on any medication for the reported indigestion, but a prescription for Carafate was planned to be initiated to manage this symptom.         MEDICAL HISTORY: Past Medical History:  Diagnosis Date   Arthritis    COPD (chronic obstructive pulmonary disease) (HCC)    COPD with chronic hypoxic respiratory failure (HCC) 08/17/2022   First diagnosis 12/2020 upon admission for multifocal pneumonia   [place] pulmonary  PFTs 2022 confirmed obstruction and mild reduction in DLCO     History of radiation therapy    RUL lung and LLL lung 06/15/2021-06/24/2021  Dr Antony Blackbird   Hx of cervical spine fracture 2/2 fall managed with neck brace 08/20/2022   Hx of herpes zoster infection of R thoracic region; no neuropathic pain    Non-small cell lung cancer (NSCLC) (HCC) 12/2020   Pneumonia    Rhinorrhea, chronic right unilateral 02/17/2023   Sepsis (HCC) 12/18/2020    ALLERGIES:  is allergic to penicillins.  MEDICATIONS:  Current Outpatient Medications  Medication Sig Dispense Refill   ipratropium (ATROVENT) 0.06 % nasal spray Place  2 sprays into both nostrils 2 (two) times daily as needed for rhinitis. 15 mL 5   levothyroxine (SYNTHROID) 25 MCG tablet Take 1 tablet (25 mcg total) by mouth daily before breakfast. 30 tablet 5   naproxen sodium (ALEVE) 220 MG tablet Take 220 mg by mouth as needed.     OXYGEN Inhale 3 L into the lungs as needed. When 02 sats drop below 92     prochlorperazine (COMPAZINE) 10 MG tablet  Take 1 tablet (10 mg total) by mouth every 6 (six) hours as needed. 30 tablet 2   No current facility-administered medications for this visit.    SURGICAL HISTORY:  Past Surgical History:  Procedure Laterality Date   BRONCHIAL BIOPSY  03/09/2021   Procedure: BRONCHIAL BIOPSIES;  Surgeon: Leslye Peer, MD;  Location: [facility] ENDOSCOPY;  Service: Pulmonary;;   BRONCHIAL BIOPSY  04/04/2023   Procedure: BRONCHIAL BIOPSIES;  Surgeon: Leslye Peer, MD;  Location[facility] ENDOSCOPY;  Service: Pulmonary;;   BRONCHIAL BRUSHINGS  03/09/2021   Procedure: BRONCHIAL BRUSHINGS;  Surgeon: Leslye Peer, MD;  Location: [facility] ENDOSCOPY;  Service: Pulmonary;;   BRONCHIAL BRUSHINGS  04/04/2023   Procedure: BRONCHIAL BRUSHINGS;  Surgeon: Leslye Peer, MD;  Location: [facility] ENDOSCOPY;  Service: Pulmonary;;   BRONCHIAL NEEDLE ASPIRATION BIOPSY  03/09/2021   Procedure: BRONCHIAL NEEDLE ASPIRATION BIOPSIES;  Surgeon: Leslye Peer, MD;  Location: [facility] ENDOSCOPY;  Service: Pulmonary;;   BRONCHIAL NEEDLE ASPIRATION BIOPSY  04/04/2023   Procedure: BRONCHIAL NEEDLE ASPIRATION BIOPSIES;  Surgeon: Leslye Peer, MD;  Location: [facility] ENDOSCOPY;  Service: Pulmonary;;   DILATION AND CURETTAGE OF UTERUS     ENDOBRONCHIAL ULTRASOUND Right 04/04/2023   Procedure: ENDOBRONCHIAL ULTRASOUND WITH FLURO;  Surgeon: Leslye Peer, MD;  Location[facility] ENDOSCOPY;  Service: Pulmonary;  Laterality: Right;   EYE SURGERY     bilateral cataract removal   FIDUCIAL MARKER PLACEMENT  04/04/2023   Procedure: FIDUCIAL MARKER PLACEMENT;  Surgeon: Leslye Peer, MD;  Location: [facility] ENDOSCOPY;  Service: Pulmonary;;   TONSILLECTOMY     VIDEO BRONCHOSCOPY WITH ENDOBRONCHIAL NAVIGATION Bilateral 03/09/2021   Procedure: VIDEO BRONCHOSCOPY WITH ENDOBRONCHIAL NAVIGATION;  Surgeon: Leslye Peer, MD;  Location: [facility] ENDOSCOPY;  Service: Pulmonary;  Laterality: Bilateral;  ION w/ possible fiducial    REVIEW OF SYSTEMS:  Constitutional: positive for fatigue Eyes:  negative Ears, nose, mouth, throat, and face: negative Respiratory: positive for dyspnea on exertion Cardiovascular: negative Gastrointestinal: positive for odynophagia Genitourinary:negative Integument/breast: negative Hematologic/lymphatic: negative Musculoskeletal:negative Neurological: negative Behavioral/Psych: negative Endocrine: negative Allergic/Immunologic: negative   PHYSICAL EXAMINATION: General appearance: alert, cooperative, fatigued, and no distress Head: Normocephalic, without obvious abnormality, atraumatic Neck: no adenopathy, no JVD, supple, symmetrical, trachea midline, and thyroid not enlarged, symmetric, no tenderness/mass/nodules Lymph nodes: Cervical, supraclavicular, and axillary nodes normal. Resp: clear to auscultation bilaterally Back: symmetric, no curvature. ROM normal. No CVA tenderness. Cardio: regular rate and rhythm, S1, S2 normal, no murmur, click, rub or gallop GI: soft, non-tender; bowel sounds normal; no masses,  no organomegaly Extremities: extremities normal, atraumatic, no cyanosis or edema Neurologic: Alert and oriented X 3, normal strength and tone. Normal symmetric reflexes. Normal coordination and gait  ECOG PERFORMANCE STATUS: 1 - Symptomatic but completely ambulatory  Blood pressure 112/61, pulse 81, temperature (!) 97.3 F (36.3 C), temperature source Temporal, resp. rate 16, height 5\' 4"  (1.626 m), weight 140 lb 8 oz (63.7 kg), SpO2 96%.  LABORATORY DATA: Lab Results  Component Value Date   WBC 4.0 05/30/2023  HGB 10.5 (L) 05/30/2023   HCT 32.8 (L) 05/30/2023   MCV 102.2 (H) 05/30/2023   PLT 141 (L) 05/30/2023      Chemistry      Component Value Date/Time   NA 139 05/23/2023 1301   K 4.8 05/23/2023 1301   CL 100 05/23/2023 1301   CO2 35 (H) 05/23/2023 1301   BUN 14 05/23/2023 1301   CREATININE 0.55 05/23/2023 1301      Component Value Date/Time   CALCIUM 9.3 05/23/2023 1301   ALKPHOS 78 05/23/2023 1301   AST 27  05/23/2023 1301   ALT 21 05/23/2023 1301   BILITOT 0.6 05/23/2023 1301       RADIOGRAPHIC STUDIES: No results found.   ASSESSMENT AND PLAN: This is a very pleasant 89 years old white female with a stage IV (T2b, N0, M1 a) non-small cell lung cancer, squamous cell carcinoma diagnosed in November 2022 and presented with right upper lobe lung mass and adjacent nodule in addition to synchronous primary versus oligometastatic nodule in the left lower lobe diagnosed and November 2022. Molecular studies showed no actionable mutations and PD-L1 expression of 1%. The patient status post SBRT to the left and right lung nodule under the care of Dr. Roselind Messier. The patient has been in observation and she was feeling fine except for the baseline shortness of breath. She had evidence for disease recurrence with non-small cell lung cancer, squamous cell carcinoma in December 2024. The patient is currently undergoing a course of concurrent chemoradiation with weekly carboplatin for AUC of 2 and paclitaxel 45 Mg/M2.  She is status post 4 cycles.    Stage IV Non-Small Cell Lung Cancer (NSCLC), Squamous Cell Carcinoma Diagnosed in November 2022 with stage IV NSCLC, squamous cell carcinoma, presenting with right upper lobe and left lower lobe nodules. Initially treated with SBRT. Recurrence in December 2024 with mediastinal and lymph node involvement. Currently undergoing chemoradiation with carboplatin and paclitaxel. Completed four cycles, here for the fifth. No significant side effects except mild indigestion. Mild anemia noted but not concerning. Weight gain observed, favorable during treatment. Mild anemia not severe enough to alter treatment. Indigestion may persist post-treatment. Informed consent obtained for continuation. - Administer fifth cycle of carboplatin and paclitaxel - Order follow-up scan post-treatment cycle - Schedule follow-up appointment in two weeks  Radiation-Induced Esophagitis Mild  indigestion likely due to radiation-induced esophagitis. No other significant gastrointestinal symptoms. Carafate discussed for symptom relief. - Prescribe Carafate, to be taken dissolved in water, half an hour before food and at nighttime - Send prescription to [facility] at [facility]  Mild Anemia Mild anemia noted on lab work, not severe enough to alter treatment plan. - Monitor anemia with regular blood work.   The patient was advised to call immediately if she has any other concerning symptoms in the interval. The patient voices understanding of current disease status and treatment options and is in agreement with the current care plan.  All questions were answered. The patient knows to call the clinic with any problems, questions or concerns. We can certainly see the patient much sooner if necessary.   Disclaimer: This note was dictated with voice recognition software. Similar sounding words can inadvertently be transcribed and may not be corrected upon review.

## 2023-05-31 ENCOUNTER — Ambulatory Visit
Admission: RE | Admit: 2023-05-31 | Discharge: 2023-05-31 | Disposition: A | Discharge status: Home / Self Care | Payer: Medicare Other | Source: Ambulatory Visit | Attending: Radiation Oncology | Admitting: Radiation Oncology

## 2023-05-31 ENCOUNTER — Ambulatory Visit: Payer: Medicare Other

## 2023-05-31 ENCOUNTER — Other Ambulatory Visit: Payer: Self-pay

## 2023-05-31 DIAGNOSIS — Z510 Encounter for antineoplastic radiation therapy: Secondary | ICD-10-CM | POA: Diagnosis not present

## 2023-05-31 DIAGNOSIS — C3411 Malignant neoplasm of upper lobe, right bronchus or lung: Secondary | ICD-10-CM

## 2023-05-31 LAB — RAD ONC ARIA SESSION SUMMARY
Course Elapsed Days: 29
Course Elapsed Days: 29
Plan Fractions Treated to Date: 21
Plan Fractions Treated to Date: 1
Plan Prescribed Dose Per Fraction: 2 Gy
Plan Prescribed Dose Per Fraction: 18 Gy
Plan Total Fractions Prescribed: 30
Plan Total Fractions Prescribed: 3
Plan Total Prescribed Dose: 60 Gy
Plan Total Prescribed Dose: 54 Gy
Reference Point Dosage Given to Date: 42 Gy
Reference Point Dosage Given to Date: 18 Gy
Reference Point Session Dosage Given: 2 Gy
Reference Point Session Dosage Given: 18 Gy
Session Number: 21
Session Number: 22

## 2023-06-01 ENCOUNTER — Ambulatory Visit
Admission: RE | Admit: 2023-06-01 | Discharge: 2023-06-01 | Disposition: A | Discharge status: Home / Self Care | Payer: Medicare Other | Source: Ambulatory Visit | Attending: Radiation Oncology | Admitting: Radiation Oncology

## 2023-06-01 ENCOUNTER — Other Ambulatory Visit: Payer: Self-pay

## 2023-06-01 DIAGNOSIS — Z510 Encounter for antineoplastic radiation therapy: Secondary | ICD-10-CM | POA: Diagnosis not present

## 2023-06-01 LAB — RAD ONC ARIA SESSION SUMMARY
Course Elapsed Days: 30
Plan Fractions Treated to Date: 22
Plan Prescribed Dose Per Fraction: 2 Gy
Plan Total Fractions Prescribed: 30
Plan Total Prescribed Dose: 60 Gy
Reference Point Dosage Given to Date: 44 Gy
Reference Point Session Dosage Given: 2 Gy
Session Number: 23

## 2023-06-02 ENCOUNTER — Ambulatory Visit
Admission: RE | Admit: 2023-06-02 | Discharge: 2023-06-02 | Disposition: A | Discharge status: Home / Self Care | Payer: Medicare Other | Source: Ambulatory Visit | Attending: Radiation Oncology | Admitting: Radiation Oncology

## 2023-06-02 ENCOUNTER — Other Ambulatory Visit: Payer: Self-pay

## 2023-06-02 DIAGNOSIS — Z510 Encounter for antineoplastic radiation therapy: Secondary | ICD-10-CM | POA: Diagnosis not present

## 2023-06-02 DIAGNOSIS — C3411 Malignant neoplasm of upper lobe, right bronchus or lung: Secondary | ICD-10-CM

## 2023-06-02 LAB — RAD ONC ARIA SESSION SUMMARY
Course Elapsed Days: 31
Course Elapsed Days: 31
Plan Fractions Treated to Date: 23
Plan Fractions Treated to Date: 2
Plan Prescribed Dose Per Fraction: 2 Gy
Plan Prescribed Dose Per Fraction: 18 Gy
Plan Total Fractions Prescribed: 30
Plan Total Fractions Prescribed: 3
Plan Total Prescribed Dose: 60 Gy
Plan Total Prescribed Dose: 54 Gy
Reference Point Dosage Given to Date: 46 Gy
Reference Point Dosage Given to Date: 36 Gy
Reference Point Session Dosage Given: 2 Gy
Reference Point Session Dosage Given: 18 Gy
Session Number: 24
Session Number: 25

## 2023-06-03 ENCOUNTER — Other Ambulatory Visit: Payer: Self-pay

## 2023-06-03 ENCOUNTER — Ambulatory Visit
Admission: RE | Admit: 2023-06-03 | Discharge: 2023-06-03 | Disposition: A | Discharge status: Home / Self Care | Payer: Medicare Other | Source: Ambulatory Visit | Attending: Radiation Oncology | Admitting: Radiation Oncology

## 2023-06-03 DIAGNOSIS — Z510 Encounter for antineoplastic radiation therapy: Secondary | ICD-10-CM | POA: Diagnosis not present

## 2023-06-03 LAB — RAD ONC ARIA SESSION SUMMARY
Course Elapsed Days: 32
Plan Fractions Treated to Date: 24
Plan Prescribed Dose Per Fraction: 2 Gy
Plan Total Fractions Prescribed: 30
Plan Total Prescribed Dose: 60 Gy
Reference Point Dosage Given to Date: 48 Gy
Reference Point Session Dosage Given: 2 Gy
Session Number: 26

## 2023-06-06 ENCOUNTER — Other Ambulatory Visit: Payer: Self-pay

## 2023-06-06 ENCOUNTER — Inpatient Hospital Stay: Payer: Medicare Other

## 2023-06-06 ENCOUNTER — Ambulatory Visit
Admission: RE | Admit: 2023-06-06 | Discharge: 2023-06-06 | Disposition: A | Discharge status: Home / Self Care | Payer: Medicare Other | Source: Ambulatory Visit | Attending: Radiation Oncology | Admitting: Radiation Oncology

## 2023-06-06 VITALS — BP 161/71 | HR 84 | Temp 98.3°F | Resp 18 | Wt 137.0 lb

## 2023-06-06 DIAGNOSIS — Z8781 Personal history of (healed) traumatic fracture: Secondary | ICD-10-CM | POA: Insufficient documentation

## 2023-06-06 DIAGNOSIS — J3489 Other specified disorders of nose and nasal sinuses: Secondary | ICD-10-CM | POA: Insufficient documentation

## 2023-06-06 DIAGNOSIS — Z510 Encounter for antineoplastic radiation therapy: Secondary | ICD-10-CM | POA: Diagnosis present

## 2023-06-06 DIAGNOSIS — Z79890 Hormone replacement therapy (postmenopausal): Secondary | ICD-10-CM | POA: Insufficient documentation

## 2023-06-06 DIAGNOSIS — Z79633 Long term (current) use of mitotic inhibitor: Secondary | ICD-10-CM | POA: Insufficient documentation

## 2023-06-06 DIAGNOSIS — M129 Arthropathy, unspecified: Secondary | ICD-10-CM | POA: Insufficient documentation

## 2023-06-06 DIAGNOSIS — Z923 Personal history of irradiation: Secondary | ICD-10-CM | POA: Insufficient documentation

## 2023-06-06 DIAGNOSIS — A419 Sepsis, unspecified organism: Secondary | ICD-10-CM | POA: Insufficient documentation

## 2023-06-06 DIAGNOSIS — Z5111 Encounter for antineoplastic chemotherapy: Secondary | ICD-10-CM | POA: Insufficient documentation

## 2023-06-06 DIAGNOSIS — C3411 Malignant neoplasm of upper lobe, right bronchus or lung: Secondary | ICD-10-CM | POA: Insufficient documentation

## 2023-06-06 DIAGNOSIS — Z79899 Other long term (current) drug therapy: Secondary | ICD-10-CM | POA: Insufficient documentation

## 2023-06-06 DIAGNOSIS — Z9221 Personal history of antineoplastic chemotherapy: Secondary | ICD-10-CM | POA: Insufficient documentation

## 2023-06-06 DIAGNOSIS — J449 Chronic obstructive pulmonary disease, unspecified: Secondary | ICD-10-CM | POA: Insufficient documentation

## 2023-06-06 DIAGNOSIS — C3432 Malignant neoplasm of lower lobe, left bronchus or lung: Secondary | ICD-10-CM | POA: Diagnosis not present

## 2023-06-06 DIAGNOSIS — K5900 Constipation, unspecified: Secondary | ICD-10-CM | POA: Insufficient documentation

## 2023-06-06 LAB — CBC WITH DIFFERENTIAL (CANCER CENTER ONLY)
Abs Immature Granulocytes: 0.01 10*3/uL (ref 0.00–0.07)
Basophils Absolute: 0 10*3/uL (ref 0.0–0.1)
Basophils Relative: 1 %
Eosinophils Absolute: 0 10*3/uL (ref 0.0–0.5)
Eosinophils Relative: 1 %
HCT: 33.1 % — ABNORMAL LOW (ref 36.0–46.0)
Hemoglobin: 10.4 g/dL — ABNORMAL LOW (ref 12.0–15.0)
Immature Granulocytes: 0 %
Lymphocytes Relative: 24 %
Lymphs Abs: 0.7 10*3/uL (ref 0.7–4.0)
MCH: 33.3 pg (ref 26.0–34.0)
MCHC: 31.4 g/dL (ref 30.0–36.0)
MCV: 106.1 fL — ABNORMAL HIGH (ref 80.0–100.0)
Monocytes Absolute: 0.3 10*3/uL (ref 0.1–1.0)
Monocytes Relative: 10 %
Neutro Abs: 1.9 10*3/uL (ref 1.7–7.7)
Neutrophils Relative %: 64 %
Platelet Count: 125 10*3/uL — ABNORMAL LOW (ref 150–400)
RBC: 3.12 MIL/uL — ABNORMAL LOW (ref 3.87–5.11)
RDW: 14.1 % (ref 11.5–15.5)
WBC Count: 3 10*3/uL — ABNORMAL LOW (ref 4.0–10.5)
nRBC: 0 % (ref 0.0–0.2)

## 2023-06-06 LAB — CMP (CANCER CENTER ONLY)
ALT: 26 U/L (ref 0–44)
ALT: 23 U/L (ref 0–44)
AST: 34 U/L (ref 15–41)
AST: 32 U/L (ref 15–41)
Albumin: 4 g/dL (ref 3.5–5.0)
Albumin: 3.6 g/dL (ref 3.5–5.0)
Alkaline Phosphatase: 81 U/L (ref 38–126)
Alkaline Phosphatase: 74 U/L (ref 38–126)
Anion gap: 4 — ABNORMAL LOW (ref 5–15)
Anion gap: 3 — ABNORMAL LOW (ref 5–15)
BUN: 13 mg/dL (ref 8–23)
BUN: 13 mg/dL (ref 8–23)
CO2: 35 mmol/L — ABNORMAL HIGH (ref 22–32)
CO2: 34 mmol/L — ABNORMAL HIGH (ref 22–32)
Calcium: 9.4 mg/dL (ref 8.9–10.3)
Calcium: 8.8 mg/dL — ABNORMAL LOW (ref 8.9–10.3)
Chloride: 102 mmol/L (ref 98–111)
Chloride: 105 mmol/L (ref 98–111)
Creatinine: 0.58 mg/dL (ref 0.44–1.00)
Creatinine: 0.54 mg/dL (ref 0.44–1.00)
GFR, Estimated: >60 mL/min (ref >60)
GFR, Estimated: >60 mL/min (ref >60)
Glucose, Bld: 89 mg/dL (ref 70–99)
Glucose, Bld: 139 mg/dL — ABNORMAL HIGH (ref 70–99)
Potassium: 5.7 mmol/L — ABNORMAL HIGH (ref 3.5–5.1)
Potassium: 5 mmol/L (ref 3.5–5.1)
Sodium: 141 mmol/L (ref 135–145)
Sodium: 142 mmol/L (ref 135–145)
Total Bilirubin: 0.3 mg/dL (ref 0.0–1.2)
Total Bilirubin: 0.3 mg/dL (ref 0.0–1.2)
Total Protein: 6.7 g/dL (ref 6.5–8.1)
Total Protein: 6.1 g/dL — ABNORMAL LOW (ref 6.5–8.1)

## 2023-06-06 LAB — RAD ONC ARIA SESSION SUMMARY
Course Elapsed Days: 35
Plan Fractions Treated to Date: 3
Plan Prescribed Dose Per Fraction: 18 Gy
Plan Total Fractions Prescribed: 3
Plan Total Prescribed Dose: 54 Gy
Reference Point Dosage Given to Date: 54 Gy
Reference Point Session Dosage Given: 18 Gy
Session Number: 27

## 2023-06-06 MED ORDER — DIPHENHYDRAMINE HCL 50 MG/ML IJ SOLN
25.0000 mg | Freq: Once | INTRAMUSCULAR | Status: AC
  Administered 2023-06-06: 25 mg via INTRAVENOUS
  Filled 2023-06-06: qty 1

## 2023-06-06 MED ORDER — CARBOPLATIN CHEMO INJECTION 450 MG/45ML
130.0000 mg | Freq: Once | INTRAVENOUS | Status: AC
  Administered 2023-06-06: 130 mg via INTRAVENOUS
  Filled 2023-06-06: qty 13

## 2023-06-06 MED ORDER — SODIUM CHLORIDE 0.9 % IV SOLN
45.0000 mg/m2 | Freq: Once | INTRAVENOUS | Status: AC
  Administered 2023-06-06: 78 mg via INTRAVENOUS
  Filled 2023-06-06: qty 13

## 2023-06-06 MED ORDER — PALONOSETRON HCL INJECTION 0.25 MG/5ML
0.2500 mg | Freq: Once | INTRAVENOUS | Status: AC
  Administered 2023-06-06: 0.25 mg via INTRAVENOUS
  Filled 2023-06-06: qty 5

## 2023-06-06 MED ORDER — DEXAMETHASONE SODIUM PHOSPHATE 10 MG/ML IJ SOLN
10.0000 mg | Freq: Once | INTRAMUSCULAR | Status: AC
  Administered 2023-06-06: 10 mg via INTRAVENOUS
  Filled 2023-06-06: qty 1

## 2023-06-06 MED ORDER — FAMOTIDINE IN NACL 20-0.9 MG/50ML-% IV SOLN
20.0000 mg | Freq: Once | INTRAVENOUS | Status: AC
  Administered 2023-06-06: 20 mg via INTRAVENOUS
  Filled 2023-06-06: qty 50

## 2023-06-06 MED ORDER — SODIUM CHLORIDE 0.9 % IV SOLN
INTRAVENOUS | Status: DC
Start: 2023-06-06 — End: 2023-06-06

## 2023-06-06 NOTE — Patient Instructions (Signed)
 [facility] - A DEPT OF [facility]  Discharge Instructions: Thank you for choosing [facility] to provide your oncology and hematology care.   If you have a lab appointment with the Cancer Center, please go directly to the Cancer Center and check in at the registration area.   Wear comfortable clothing and clothing appropriate for easy access to any Portacath or PICC line.   We strive to give you quality time with your provider. You may need to reschedule your appointment if you arrive late (15 or more minutes).  Arriving late affects you and other patients whose appointments are after yours.  Also, if you miss three or more appointments without notifying the office, you may be dismissed from the clinic at the provider's discretion.      For prescription refill requests, have your pharmacy contact our office and allow 72 hours for refills to be completed.    Today you received the following chemotherapy and/or immunotherapy agents:   Paclitaxel, Carboplatin    To help prevent nausea and vomiting after your treatment, we encourage you to take your nausea medication as directed.  BELOW ARE SYMPTOMS THAT SHOULD BE REPORTED IMMEDIATELY: *FEVER GREATER THAN 100.4 F (38 C) OR HIGHER *CHILLS OR SWEATING *NAUSEA AND VOMITING THAT IS NOT CONTROLLED WITH YOUR NAUSEA MEDICATION *UNUSUAL SHORTNESS OF BREATH *UNUSUAL BRUISING OR BLEEDING *URINARY PROBLEMS (pain or burning when urinating, or frequent urination) *BOWEL PROBLEMS (unusual diarrhea, constipation, pain near the anus) TENDERNESS IN MOUTH AND THROAT WITH OR WITHOUT PRESENCE OF ULCERS (sore throat, sores in mouth, or a toothache) UNUSUAL RASH, SWELLING OR PAIN  UNUSUAL VAGINAL DISCHARGE OR ITCHING   Items with * indicate a potential emergency and should be followed up as soon as possible or go to the Emergency Department if any problems should occur.  Please show the CHEMOTHERAPY ALERT CARD or  IMMUNOTHERAPY ALERT CARD at check-in to the Emergency Department and triage nurse.  Should you have questions after your visit or need to cancel or reschedule your appointment, please contact [facility] - A DEPT OF [facility]  Dept: [phone]  and follow the prompts.  Office hours are 8:00 a.m. to 4:30 p.m. Monday - Friday. Please note that voicemails left after 4:00 p.m. may not be returned until the following business day.  We are closed weekends and major holidays. You have access to a nurse at all times for urgent questions. Please call the main number to the clinic Dept: [phone] and follow the prompts.   For any non-urgent questions, you may also contact your provider using MyChart. We now offer e-Visits for anyone 63 and older to request care online for non-urgent symptoms. For details visit [email or web address].   Also download the MyChart app! Go to the app store, search "MyChart", open the app, select [place], and log in with your MyChart username and password.

## 2023-06-07 ENCOUNTER — Ambulatory Visit
Admission: RE | Admit: 2023-06-07 | Discharge: 2023-06-07 | Disposition: A | Discharge status: Home / Self Care | Payer: Medicare Other | Source: Ambulatory Visit | Attending: Radiation Oncology | Admitting: Radiation Oncology

## 2023-06-07 ENCOUNTER — Other Ambulatory Visit: Payer: Self-pay

## 2023-06-07 DIAGNOSIS — Z510 Encounter for antineoplastic radiation therapy: Secondary | ICD-10-CM | POA: Diagnosis not present

## 2023-06-07 LAB — RAD ONC ARIA SESSION SUMMARY
Course Elapsed Days: 36
Plan Fractions Treated to Date: 26
Plan Prescribed Dose Per Fraction: 2 Gy
Plan Total Fractions Prescribed: 30
Plan Total Prescribed Dose: 60 Gy
Reference Point Dosage Given to Date: 52 Gy
Reference Point Session Dosage Given: 2 Gy
Session Number: 28

## 2023-06-08 ENCOUNTER — Other Ambulatory Visit: Payer: Self-pay

## 2023-06-08 ENCOUNTER — Ambulatory Visit
Admission: RE | Admit: 2023-06-08 | Discharge: 2023-06-08 | Disposition: A | Discharge status: Home / Self Care | Payer: Medicare Other | Source: Ambulatory Visit | Attending: Radiation Oncology | Admitting: Radiation Oncology

## 2023-06-08 DIAGNOSIS — Z510 Encounter for antineoplastic radiation therapy: Secondary | ICD-10-CM | POA: Diagnosis not present

## 2023-06-08 LAB — RAD ONC ARIA SESSION SUMMARY
Course Elapsed Days: 37
Plan Fractions Treated to Date: 27
Plan Prescribed Dose Per Fraction: 2 Gy
Plan Total Fractions Prescribed: 30
Plan Total Prescribed Dose: 60 Gy
Reference Point Dosage Given to Date: 54 Gy
Reference Point Session Dosage Given: 2 Gy
Session Number: 29

## 2023-06-09 ENCOUNTER — Other Ambulatory Visit: Payer: Self-pay

## 2023-06-09 ENCOUNTER — Ambulatory Visit
Admission: RE | Admit: 2023-06-09 | Discharge: 2023-06-09 | Disposition: A | Discharge status: Home / Self Care | Payer: Medicare Other | Source: Ambulatory Visit | Attending: Radiation Oncology | Admitting: Radiation Oncology

## 2023-06-09 DIAGNOSIS — Z510 Encounter for antineoplastic radiation therapy: Secondary | ICD-10-CM | POA: Diagnosis not present

## 2023-06-09 LAB — RAD ONC ARIA SESSION SUMMARY
Course Elapsed Days: 38
Plan Fractions Treated to Date: 28
Plan Prescribed Dose Per Fraction: 2 Gy
Plan Total Fractions Prescribed: 30
Plan Total Prescribed Dose: 60 Gy
Reference Point Dosage Given to Date: 56 Gy
Reference Point Session Dosage Given: 2 Gy
Session Number: 30

## 2023-06-10 ENCOUNTER — Ambulatory Visit
Admission: RE | Admit: 2023-06-10 | Discharge: 2023-06-10 | Disposition: A | Discharge status: Home / Self Care | Payer: Medicare Other | Source: Ambulatory Visit | Attending: Radiation Oncology | Admitting: Radiation Oncology

## 2023-06-10 ENCOUNTER — Other Ambulatory Visit: Payer: Self-pay

## 2023-06-10 DIAGNOSIS — Z510 Encounter for antineoplastic radiation therapy: Secondary | ICD-10-CM | POA: Diagnosis not present

## 2023-06-10 LAB — RAD ONC ARIA SESSION SUMMARY
Course Elapsed Days: 39
Plan Fractions Treated to Date: 29
Plan Prescribed Dose Per Fraction: 2 Gy
Plan Total Fractions Prescribed: 30
Plan Total Prescribed Dose: 60 Gy
Reference Point Dosage Given to Date: 58 Gy
Reference Point Session Dosage Given: 2 Gy
Session Number: 31

## 2023-06-13 ENCOUNTER — Inpatient Hospital Stay: Payer: Medicare Other

## 2023-06-13 ENCOUNTER — Other Ambulatory Visit: Payer: Self-pay

## 2023-06-13 ENCOUNTER — Ambulatory Visit
Admission: RE | Admit: 2023-06-13 | Discharge: 2023-06-13 | Disposition: A | Discharge status: Home / Self Care | Payer: Medicare Other | Source: Ambulatory Visit | Attending: Radiation Oncology | Admitting: Radiation Oncology

## 2023-06-13 ENCOUNTER — Inpatient Hospital Stay (HOSPITAL_BASED_OUTPATIENT_CLINIC_OR_DEPARTMENT_OTHER): Payer: Medicare Other | Admitting: Internal Medicine

## 2023-06-13 VITALS — BP 128/51 | HR 95 | Temp 98.2°F | Resp 17 | Wt 135.8 lb

## 2023-06-13 DIAGNOSIS — Z510 Encounter for antineoplastic radiation therapy: Secondary | ICD-10-CM | POA: Diagnosis not present

## 2023-06-13 DIAGNOSIS — C3411 Malignant neoplasm of upper lobe, right bronchus or lung: Secondary | ICD-10-CM

## 2023-06-13 DIAGNOSIS — C3490 Malignant neoplasm of unspecified part of unspecified bronchus or lung: Secondary | ICD-10-CM

## 2023-06-13 LAB — CMP (CANCER CENTER ONLY)
ALT: 27 U/L (ref 0–44)
AST: 37 U/L (ref 15–41)
Albumin: 3.8 g/dL (ref 3.5–5.0)
Alkaline Phosphatase: 79 U/L (ref 38–126)
Anion gap: 4 — ABNORMAL LOW (ref 5–15)
BUN: 12 mg/dL (ref 8–23)
CO2: 36 mmol/L — ABNORMAL HIGH (ref 22–32)
Calcium: 9.6 mg/dL (ref 8.9–10.3)
Chloride: 98 mmol/L (ref 98–111)
Creatinine: 0.52 mg/dL (ref 0.44–1.00)
GFR, Estimated: >60 mL/min (ref >60)
Glucose, Bld: 104 mg/dL — ABNORMAL HIGH (ref 70–99)
Potassium: 4.4 mmol/L (ref 3.5–5.1)
Sodium: 138 mmol/L (ref 135–145)
Total Bilirubin: 0.4 mg/dL (ref 0.0–1.2)
Total Protein: 6.4 g/dL — ABNORMAL LOW (ref 6.5–8.1)

## 2023-06-13 LAB — CBC WITH DIFFERENTIAL (CANCER CENTER ONLY)
Abs Immature Granulocytes: 0.01 10*3/uL (ref 0.00–0.07)
Basophils Absolute: 0 10*3/uL (ref 0.0–0.1)
Basophils Relative: 1 %
Eosinophils Absolute: 0 10*3/uL (ref 0.0–0.5)
Eosinophils Relative: 1 %
HCT: 29.3 % — ABNORMAL LOW (ref 36.0–46.0)
Hemoglobin: 9.5 g/dL — ABNORMAL LOW (ref 12.0–15.0)
Immature Granulocytes: 1 %
Lymphocytes Relative: 30 %
Lymphs Abs: 0.6 10*3/uL — ABNORMAL LOW (ref 0.7–4.0)
MCH: 33.7 pg (ref 26.0–34.0)
MCHC: 32.4 g/dL (ref 30.0–36.0)
MCV: 103.9 fL — ABNORMAL HIGH (ref 80.0–100.0)
Monocytes Absolute: 0.2 10*3/uL (ref 0.1–1.0)
Monocytes Relative: 9 %
Neutro Abs: 1.2 10*3/uL — ABNORMAL LOW (ref 1.7–7.7)
Neutrophils Relative %: 58 %
Platelet Count: 136 10*3/uL — ABNORMAL LOW (ref 150–400)
RBC: 2.82 MIL/uL — ABNORMAL LOW (ref 3.87–5.11)
RDW: 14.6 % (ref 11.5–15.5)
WBC Count: 2.1 10*3/uL — ABNORMAL LOW (ref 4.0–10.5)
nRBC: 0 % (ref 0.0–0.2)

## 2023-06-13 LAB — RAD ONC ARIA SESSION SUMMARY
Course Elapsed Days: 42
Plan Fractions Treated to Date: 30
Plan Prescribed Dose Per Fraction: 2 Gy
Plan Total Fractions Prescribed: 30
Plan Total Prescribed Dose: 60 Gy
Reference Point Dosage Given to Date: 60 Gy
Reference Point Session Dosage Given: 2 Gy
Session Number: 32

## 2023-06-13 NOTE — Progress Notes (Signed)
 [facility] Telephone:(336) [phone]   Fax:(336) [phone]  OFFICE PROGRESS NOTE  [name], MD [address] [place] [address]  DIAGNOSIS:  Stage IV (T2b, N0, M1 a) non-small cell lung cancer with unclear subtype on the pathology report diagnosed in November 2022 and presented with right upper lobe lung mass as well as left upper lobe nodule in addition to right lower lobe nodule. In October 2024 she was found to have suspicious metachronous primary with RLL nodule and subcarinal node vs. Metastatic disease which was biopsied and consistent with Squamous cell carcinoma.    DETECTED ALTERATION(S) / BIOMARKER(S)     % CFDNA OR AMPLIFICATION        ASSOCIATED FDA-APPROVED THERAPIES         CLINICAL TRIAL AVAILABILITY [id number] 0.1% NoneNo   PD-L1 expression 1%   PRIOR THERAPY: SBRT to the left and right lung nodules under the care of Dr. Eloise Hake completed on June 24, 2021.    CURRENT THERAPY: Concurrent chemoradiation with carboplatin  for an AUC of 2 paclitaxel  45 mg/m.  First dose expected on 05/02/2023 status post 6 cycles  INTERVAL HISTORY: Amy Sandoval 89 y.o. female returns to the clinic today for follow-up visit accompanied by her granddaughter Sales executive. Discussed the use of AI scribe software for clinical note transcription with the patient, who gave verbal consent to proceed.  History of Present Illness   Amy Sandoval is an 89 year old female with recurrent stage IIIA non-small cell lung cancer who presents for follow-up of her treatment. She is accompanied by her granddaughter, Sales executive.  She is currently undergoing treatment for recurrent stage IIIA non-small cell lung cancer, receiving weekly carboplatin  and paclitaxel  chemotherapy along with daily radiation therapy five days a week. Today marks the last fraction of her radiation therapy.  She feels more tired than usual, which she attributes to her ongoing treatment.  She has experienced a slight weight loss of approximately one pound since her last visit. No chest pain, breathing issues, cough, fever, chills, or nausea.  She experiences mild constipation, which may be related to insufficient hydration and dietary fiber intake.         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  MEDICATIONS:  Current Outpatient Medications  Medication Sig Dispense Refill   ipratropium (ATROVENT ) 0.06 % nasal spray Place 2 sprays into both nostrils 2 (two) times daily as needed for rhinitis. 15 mL 5   levothyroxine  (SYNTHROID ) 25 MCG tablet Take 1 tablet (25 mcg total) by mouth daily before breakfast. 30 tablet 5   naproxen sodium (ALEVE) 220 MG tablet Take 220 mg by mouth as needed.     OXYGEN Inhale 3 L  into the lungs as needed. When 02 sats drop below 92     prochlorperazine  (COMPAZINE ) 10 MG tablet Take 1 tablet (10 mg total) by mouth every 6 (six) hours as needed. 30 tablet 2   sucralfate  (CARAFATE ) 1 g tablet Take 1 tablet (1 g total) by mouth 4 (four) times daily -  with meals and at bedtime. 120 tablet 1   No current facility-administered medications for this visit.    SURGICAL HISTORY:  Past Surgical History:  Procedure Laterality Date    BRONCHIAL BIOPSY  03/09/2021   Procedure: BRONCHIAL BIOPSIES;  Surgeon: Denson Flake, MD;  Location: [facility] ENDOSCOPY;  Service: Pulmonary;;   BRONCHIAL BIOPSY  04/04/2023   Procedure: BRONCHIAL BIOPSIES;  Surgeon: Denson Flake, MD;  Location: [facility] ENDOSCOPY;  Service: Pulmonary;;   BRONCHIAL BRUSHINGS  03/09/2021   Procedure: BRONCHIAL BRUSHINGS;  Surgeon: Denson Flake, MD;  Location: [facility] ENDOSCOPY;  Service: Pulmonary;;   BRONCHIAL BRUSHINGS  04/04/2023   Procedure: BRONCHIAL BRUSHINGS;  Surgeon: Denson Flake, MD;  Location: [facility] ENDOSCOPY;  Service: Pulmonary;;   BRONCHIAL NEEDLE ASPIRATION BIOPSY  03/09/2021   Procedure: BRONCHIAL NEEDLE ASPIRATION BIOPSIES;  Surgeon: Denson Flake, MD;  Location: [facility] ENDOSCOPY;  Service: Pulmonary;;   BRONCHIAL NEEDLE ASPIRATION BIOPSY  04/04/2023   Procedure: BRONCHIAL NEEDLE ASPIRATION BIOPSIES;  Surgeon: Denson Flake, MD;  Location: [facility] ENDOSCOPY;  Service: Pulmonary;;   DILATION AND CURETTAGE OF UTERUS     ENDOBRONCHIAL ULTRASOUND Right 04/04/2023   Procedure: ENDOBRONCHIAL ULTRASOUND WITH FLURO;  Surgeon: Denson Flake, MD;  Location: [facility] ENDOSCOPY;  Service: Pulmonary;  Laterality: Right;   EYE SURGERY     bilateral cataract removal   FIDUCIAL MARKER PLACEMENT  04/04/2023   Procedure: FIDUCIAL MARKER PLACEMENT;  Surgeon: Denson Flake, MD;  Location: [facility] ENDOSCOPY;  Service: Pulmonary;;   TONSILLECTOMY     VIDEO BRONCHOSCOPY WITH ENDOBRONCHIAL NAVIGATION Bilateral 03/09/2021   Procedure: VIDEO BRONCHOSCOPY WITH ENDOBRONCHIAL NAVIGATION;  Surgeon: Denson Flake, MD;  Location: [facility] ENDOSCOPY;  Service: Pulmonary;  Laterality: Bilateral;  ION w/ possible fiducial    REVIEW OF SYSTEMS:  A comprehensive review of systems was negative except for: Constitutional: positive for fatigue Respiratory: positive for dyspnea on exertion   PHYSICAL EXAMINATION: General appearance: alert, cooperative, fatigued, and no distress Head: Normocephalic, without  obvious abnormality, atraumatic Neck: no adenopathy, no JVD, supple, symmetrical, trachea midline, and thyroid not enlarged, symmetric, no tenderness/mass/nodules Lymph nodes: Cervical, supraclavicular, and axillary nodes normal. Resp: clear to auscultation bilaterally Back: symmetric, no curvature. ROM normal. No CVA tenderness. Cardio: regular rate and rhythm, S1, S2 normal, no murmur, click, rub or gallop GI: soft, non-tender; bowel sounds normal; no masses,  no organomegaly Extremities: extremities normal, atraumatic, no cyanosis or edema  ECOG PERFORMANCE STATUS: 1 - Symptomatic but completely ambulatory  Blood pressure (!) 128/51, pulse 95, temperature 98.2 F (36.8 C), temperature source Temporal, resp. rate 17, weight 135 lb 12.8 oz (61.6 kg), SpO2 95%.  LABORATORY DATA: Lab Results  Component Value Date   WBC 3.0 (L) 06/06/2023   HGB 10.4 (L) 06/06/2023   HCT 33.1 (L) 06/06/2023   MCV 106.1 (H) 06/06/2023   PLT 125 (L) 06/06/2023      Chemistry      Component Value Date/Time   NA 142 06/06/2023 1214   K 5.0 06/06/2023 1214   CL 105 06/06/2023 1214   CO2 34 (H) 06/06/2023 1214   BUN 13 06/06/2023 1214   CREATININE 0.54  06/06/2023 1214      Component Value Date/Time   CALCIUM 8.8 (L) 06/06/2023 1214   ALKPHOS 74 06/06/2023 1214   AST 32 06/06/2023 1214   ALT 23 06/06/2023 1214   BILITOT 0.3 06/06/2023 1214       RADIOGRAPHIC STUDIES: No results found.   ASSESSMENT AND PLAN: This is a very pleasant 89 years old white female with a stage IV (T2b, N0, M1 a) non-small cell lung cancer, squamous cell carcinoma diagnosed in November 2022 and presented with right upper lobe lung mass and adjacent nodule in addition to synchronous primary versus oligometastatic nodule in the left lower lobe diagnosed and November 2022. Molecular studies showed no actionable mutations and PD-L1 expression of 1%. The patient status post SBRT to the left and right lung nodule under the  care of Dr. Eloise Hake. The patient has been in observation and she was feeling fine except for the baseline shortness of breath. She had evidence for disease recurrence with non-small cell lung cancer, squamous cell carcinoma in December 2024. The patient is currently undergoing a course of concurrent chemoradiation with weekly carboplatin  for AUC of 2 and paclitaxel  45 Mg/M2.  She is status post 6 cycles.     Stage IIIA Non-Small Cell Lung Cancer (NSCLC) Recurrent Stage IIIA NSCLC. Currently undergoing weekly carboplatin  and paclitaxel , and daily radiation therapy five days a week. Today marks the last fraction of radiation therapy. Reports increased fatigue and mild constipation, but no chest pain, dyspnea, cough, fever, chills, or nausea. Slight weight loss noted (approximately one pound). Informed consent obtained for canceling the last cycle of chemotherapy due to the completion of radiation therapy today and the absence of additional radiation sessions this week. - Cancel scheduled chemotherapy for tomorrow - Order lab tests today - Schedule follow-up appointment in four weeks - Order scan in three weeks to assess treatment efficacy - Schedule follow-up scan one week before the next appointment.   The patient was advised to call immediately if she has any concerning symptoms in the interval. The patient voices understanding of current disease status and treatment options and is in agreement with the current care plan.  All questions were answered. The patient knows to call the clinic with any problems, questions or concerns. We can certainly see the patient much sooner if necessary.   Disclaimer: This note was dictated with voice recognition software. Similar sounding words can inadvertently be transcribed and may not be corrected upon review.

## 2023-06-14 ENCOUNTER — Inpatient Hospital Stay: Payer: Medicare Other

## 2023-06-14 NOTE — Radiation Completion Notes (Addendum)
  Radiation Oncology         [phone]) [phone] ________________________________  Name: Amy Sandoval MRN: [id number]  Date of Service: 06/13/2023  DOB: [birth year]  End of Treatment Note    Diagnosis: Stage IV (T2b, N0, M1 a) non-small cell lung cancer with unclear subtype on the pathology report diagnosed in November 2022 recently presented with right upper lobe lung mass and left upper lobe nodule in addition to right lower lobe nodule. In October 2024 she was found to have suspicious metachronous primary with RLL nodule and subcarinal node vs. metastatic disease which was biopsied and consistent with squamous cell carcinoma.    ==========DELIVERED PLANS==========  First Treatment Date: 2023-05-02 Last Treatment Date: 2023-06-13   Plan Name: Lung_RLL_SBRT Site: Lung, Right Technique: SBRT/SRT-IMRT Mode: Photon Dose Per Fraction: 18 Gy Prescribed Dose (Delivered / Prescribed): 54 Gy / 54 Gy Prescribed Fxs (Delivered / Prescribed): 3 / 3   Plan Name: Chest_Media Site: Mediastinum Technique: IMRT Mode: Photon Dose Per Fraction: 2 Gy Prescribed Dose (Delivered / Prescribed): 60 Gy / 60 Gy Prescribed Fxs (Delivered / Prescribed): 30 / 30     ====================================  The patient developed numbness in the right side of her chest that extended to the axilla, but otherwise tolerated the treatment without any significant acute effects.   The patient will return in one month and will continue follow up with Dr. Arbutus Ped as well.      Joyice Faster, PA-C

## 2023-07-04 ENCOUNTER — Ambulatory Visit (HOSPITAL_COMMUNITY)
Admission: RE | Admit: 2023-07-04 | Discharge: 2023-07-04 | Disposition: A | Discharge status: Home / Self Care | Payer: Medicare Other | Source: Ambulatory Visit | Attending: Internal Medicine | Admitting: Internal Medicine

## 2023-07-04 DIAGNOSIS — C3490 Malignant neoplasm of unspecified part of unspecified bronchus or lung: Secondary | ICD-10-CM | POA: Diagnosis present

## 2023-07-04 MED ORDER — IOHEXOL 300 MG/ML  SOLN
75.0000 mL | Freq: Once | INTRAMUSCULAR | Status: AC | PRN
  Administered 2023-07-04: 75 mL via INTRAVENOUS

## 2023-07-12 ENCOUNTER — Encounter: Payer: Self-pay | Admitting: Radiation Oncology

## 2023-07-13 NOTE — Progress Notes (Signed)
 Radiation Oncology         [phone]) [phone] ________________________________  Name: Amy Sandoval MRN: [id number]  Date: 07/14/2023  DOB: 1935/05/01  Follow-Up Visit Note  CC: Miguel Aschoff, MD  Miguel Aschoff, MD  No diagnosis found.  Diagnosis: The primary encounter diagnosis was Right lower lobe pulmonary nodule. A diagnosis of Primary non-small cell carcinoma of upper lobe of right lung [facility]) was also pertinent to this visit.   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    Adenocarcinoma of the RUL, and non-small cell carcinoma of the LLL (unclear subtype) diagnosed in November 2022: s/p radiation therapy   Interval Since Last Radiation:  1 month and 3 days    Intent: Curative  Radiation Treatment Dates: First Treatment Date: 2023-05-02 Last Treatment Date: 2023-06-13   Plan Name: Lung_RLL_SBRT Site: Lung, Right Technique: SBRT/SRT-IMRT Mode: Photon Dose Per Fraction: 18 Gy Prescribed Dose (Delivered / Prescribed): 54 Gy / 54 Gy Prescribed Fxs (Delivered / Prescribed): 3 / 3   Plan Name: Chest_Media Site: Mediastinum Technique: IMRT Mode: Photon Dose Per Fraction: 2 Gy Prescribed Dose (Delivered / Prescribed): 60 Gy / 60 Gy Prescribed Fxs (Delivered / Prescribed): 30 / 30   Narrative:  The patient returns today for routine follow-up. She was last seen in office on 04-22-23 for a reconsultation visit. Since then, she completed her radiation treatment. Patient did report developing numbness in the right side of her chest that extended to the axilla. Otherwise, no further acute side-effects were reported.   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 No evidence of intracranial metastatic disease was indicated on exam.   Patient continued to follow up with Dr. Shirline Frees.  During a follow up on 05-02-23, she complained of persistent shortness of breath that was managed with supplemental oxygen. On 05-30-23, she complained of indigestion which she was prescribed Carafate for.     Most recent follow up with Dr. Shirline Frees on 06-13-23, she reported increased fatigue and mild weight loss which she attributes to her ongoing treatment.      She underwent a CT chest on 07-04-23 showing  ***           Allergies:  is allergic to penicillins.  Meds: Current Outpatient Medications  Medication Sig Dispense Refill   ipratropium (ATROVENT) 0.06 % nasal spray Place 2 sprays into both nostrils 2 (two) times daily as needed for rhinitis. 15 mL 5   levothyroxine (SYNTHROID) 25 MCG tablet Take 1 tablet (25 mcg total) by mouth daily before breakfast. 30 tablet 5   naproxen sodium (ALEVE) 220 MG tablet Take 220 mg by mouth as needed.     OXYGEN Inhale 3 L into the lungs as needed. When 02 sats drop below 92     prochlorperazine (COMPAZINE) 10 MG tablet Take 1 tablet (10 mg total) by mouth every 6 (six) hours as needed. 30 tablet 2   sucralfate (CARAFATE) 1 g tablet Take 1 tablet (1 g total) by mouth 4 (four) times daily -  with meals and at bedtime. 120 tablet 1   No current facility-administered medications for this encounter.    Physical Findings: The patient is in no acute distress. Patient is alert and oriented.  vitals were not taken for this visit. .  No significant changes. Lungs are clear to auscultation bilaterally. Heart has  regular rate and rhythm. No palpable cervical, supraclavicular, or axillary adenopathy. Abdomen soft, non-tender, normal bowel sounds.   Lab Findings: Lab Results  Component Value Date   WBC 2.1 (L) 06/13/2023   HGB 9.5 (L) 06/13/2023   HCT 29.3 (L) 06/13/2023   MCV 103.9 (H) 06/13/2023   PLT 136 (L) 06/13/2023    Radiographic Findings: No results found.  Impression: Right lower lobe pulmonary nodule. A diagnosis of Primary non-small cell  carcinoma of upper lobe of right lung [facility])   The patient is recovering from the effects of radiation.  ***  Plan:  ***   *** minutes of total time was spent for this patient encounter, including preparation, face-to-face counseling with the patient and coordination of care, physical exam, and documentation of the encounter. ____________________________________  Billie Lade, PhD, MD  This document serves as a record of services personally performed by Antony Blackbird, MD. It was created on his behalf by Herbie Saxon, a trained medical scribe. The creation of this record is based on the scribe's personal observations and the provider's statements to them. This document has been checked and approved by the attending provider.

## 2023-07-14 ENCOUNTER — Encounter: Payer: Self-pay | Admitting: Radiation Oncology

## 2023-07-14 ENCOUNTER — Ambulatory Visit
Admission: RE | Admit: 2023-07-14 | Discharge: 2023-07-14 | Disposition: A | Discharge status: Home / Self Care | Payer: Medicare Other | Source: Ambulatory Visit | Attending: Radiation Oncology | Admitting: Radiation Oncology

## 2023-07-14 ENCOUNTER — Inpatient Hospital Stay (HOSPITAL_BASED_OUTPATIENT_CLINIC_OR_DEPARTMENT_OTHER): Payer: Medicare Other | Admitting: Internal Medicine

## 2023-07-14 ENCOUNTER — Inpatient Hospital Stay: Payer: Medicare Other | Attending: Physician Assistant

## 2023-07-14 VITALS — BP 122/71 | HR 82 | Temp 97.8°F | Resp 16 | Ht 64.0 in | Wt 131.0 lb

## 2023-07-14 VITALS — BP 106/62 | HR 106 | Temp 97.1°F | Resp 22 | Ht 64.0 in | Wt 131.5 lb

## 2023-07-14 DIAGNOSIS — R911 Solitary pulmonary nodule: Secondary | ICD-10-CM | POA: Insufficient documentation

## 2023-07-14 DIAGNOSIS — C3490 Malignant neoplasm of unspecified part of unspecified bronchus or lung: Secondary | ICD-10-CM | POA: Diagnosis not present

## 2023-07-14 DIAGNOSIS — Z9221 Personal history of antineoplastic chemotherapy: Secondary | ICD-10-CM | POA: Insufficient documentation

## 2023-07-14 DIAGNOSIS — R634 Abnormal weight loss: Secondary | ICD-10-CM | POA: Insufficient documentation

## 2023-07-14 DIAGNOSIS — K219 Gastro-esophageal reflux disease without esophagitis: Secondary | ICD-10-CM | POA: Diagnosis not present

## 2023-07-14 DIAGNOSIS — J449 Chronic obstructive pulmonary disease, unspecified: Secondary | ICD-10-CM | POA: Diagnosis not present

## 2023-07-14 DIAGNOSIS — C3411 Malignant neoplasm of upper lobe, right bronchus or lung: Secondary | ICD-10-CM | POA: Diagnosis present

## 2023-07-14 DIAGNOSIS — Z923 Personal history of irradiation: Secondary | ICD-10-CM | POA: Insufficient documentation

## 2023-07-14 DIAGNOSIS — C7801 Secondary malignant neoplasm of right lung: Secondary | ICD-10-CM | POA: Diagnosis not present

## 2023-07-14 DIAGNOSIS — M129 Arthropathy, unspecified: Secondary | ICD-10-CM | POA: Insufficient documentation

## 2023-07-14 LAB — CBC WITH DIFFERENTIAL (CANCER CENTER ONLY)
Abs Immature Granulocytes: 0.01 10*3/uL (ref 0.00–0.07)
Basophils Absolute: 0 10*3/uL (ref 0.0–0.1)
Basophils Relative: 0 %
Eosinophils Absolute: 0.1 10*3/uL (ref 0.0–0.5)
Eosinophils Relative: 2 %
HCT: 36 % (ref 36.0–46.0)
Hemoglobin: 11.3 g/dL — ABNORMAL LOW (ref 12.0–15.0)
Immature Granulocytes: 0 %
Lymphocytes Relative: 19 %
Lymphs Abs: 1.1 10*3/uL (ref 0.7–4.0)
MCH: 34.9 pg — ABNORMAL HIGH (ref 26.0–34.0)
MCHC: 31.4 g/dL (ref 30.0–36.0)
MCV: 111.1 fL — ABNORMAL HIGH (ref 80.0–100.0)
Monocytes Absolute: 0.7 10*3/uL (ref 0.1–1.0)
Monocytes Relative: 12 %
Neutro Abs: 3.7 10*3/uL (ref 1.7–7.7)
Neutrophils Relative %: 67 %
Platelet Count: 200 10*3/uL (ref 150–400)
RBC: 3.24 MIL/uL — ABNORMAL LOW (ref 3.87–5.11)
RDW: 15.4 % (ref 11.5–15.5)
WBC Count: 5.5 10*3/uL (ref 4.0–10.5)
nRBC: 0 % (ref 0.0–0.2)

## 2023-07-14 LAB — CMP (CANCER CENTER ONLY)
ALT: 26 U/L (ref 0–44)
AST: 36 U/L (ref 15–41)
Albumin: 3.8 g/dL (ref 3.5–5.0)
Alkaline Phosphatase: 90 U/L (ref 38–126)
Anion gap: 3 — ABNORMAL LOW (ref 5–15)
BUN: 8 mg/dL (ref 8–23)
CO2: 38 mmol/L — ABNORMAL HIGH (ref 22–32)
Calcium: 9 mg/dL (ref 8.9–10.3)
Chloride: 99 mmol/L (ref 98–111)
Creatinine: 0.61 mg/dL (ref 0.44–1.00)
GFR, Estimated: >60 mL/min (ref >60)
Glucose, Bld: 102 mg/dL — ABNORMAL HIGH (ref 70–99)
Potassium: 4.4 mmol/L (ref 3.5–5.1)
Sodium: 140 mmol/L (ref 135–145)
Total Bilirubin: 0.4 mg/dL (ref 0.0–1.2)
Total Protein: 6.9 g/dL (ref 6.5–8.1)

## 2023-07-14 NOTE — Progress Notes (Signed)
 Amy Sandoval is here today for follow up post radiation to the lung.  Lung Side: Right, patient completed treatment on 06/13/23  Does the patient complain of any of the following: Pain: No Shortness of breath w/wo exertion: yes, continues to wear oxygen at 3 liters Cough: No Hemoptysis: No Pain with swallowing: No Swallowing/choking concerns: No Appetite: Good Energy Level: Fair Post radiation skin Changes: No    Additional comments if applicable:   BP 106/62 (BP Location: Left Arm, Patient Position: Sitting)   Pulse (!) 106   Temp (!) 97.1 F (36.2 C) (Temporal)   Resp (!) 22   Ht 5\' 4"  (1.626 m)   Wt 131 lb 8 oz (59.6 kg)   LMP  (LMP Unknown)   SpO2 94%   BMI 22.57 kg/m

## 2023-07-14 NOTE — Progress Notes (Signed)
 [facility] Telephone:([phone]) [phone]   Fax:([phone]  OFFICE PROGRESS NOTE  Miguel Aschoff, MD [address] [place] [address]  DIAGNOSIS:  Stage IV (T2b, N0, M1 a) non-small cell lung cancer with unclear subtype on the pathology report diagnosed in November 2022 and presented with right upper lobe lung mass as well as left upper lobe nodule in addition to right lower lobe nodule. In October 2024 she was found to have suspicious metachronous primary with RLL nodule and subcarinal node vs. Metastatic disease which was biopsied and consistent with Squamous cell carcinoma.    DETECTED ALTERATION(S) / BIOMARKER(S)     % CFDNA OR AMPLIFICATION        ASSOCIATED FDA-APPROVED THERAPIES         CLINICAL TRIAL AVAILABILITY APCN1962fs 0.1% NoneNo   PD-L1 expression 1%   PRIOR THERAPY: SBRT to the left and right lung nodules under the care of Dr. Roselind Messier completed on June 24, 2021.    CURRENT THERAPY: Concurrent chemoradiation with carboplatin for an AUC of 2 paclitaxel 45 mg/m.  First dose expected on 05/02/2023 status post 6 cycles  INTERVAL HISTORY: Amy Sandoval 89 y.o. female returns to the clinic today for follow-up visit accompanied by her 2 daughters and granddaughter Hospital doctor.Discussed the use of AI scribe software for clinical note transcription with the patient, who gave verbal consent to proceed.  History of Present Illness   The patient is an 89 year old with lung cancer who presents for follow-up after completing chemotherapy and radiation therapy. She is accompanied by her two daughter and granddaughter Hospital doctor.  She has a history of lung cancer, initially diagnosed with a nodule in November 2022. She completed seven cycles of chemotherapy and radiation therapy, with the last treatment occurring approximately one month ago. She remains on oxygen therapy. A recent scan showed a decrease in the size of the right upper lobe nodule with no new nodules  or evidence of metastatic disease in the chest.  She reports no significant side effects from the treatment, stating 'I loved every minute of it.' No nausea, worsening of breathing, chest pain, or hemoptysis. She mentions some hair thinning. During the review of symptoms, she denies any difficulty swallowing, stating that the pain was the primary issue affecting her eating habits. She maintains a good appetite, except for the pain-related aversion to eating.  She reports weight loss attributed to severe acid reflux, which caused significant pain when eating, leading her to reduce food intake. She was prescribed Carafate, which she discontinued due to conflicting instructions and worsening symptoms, but her symptoms have since resolved.       MEDICAL HISTORY: Past Medical History:  Diagnosis Date   Arthritis    COPD (chronic obstructive pulmonary disease) (HCC)    COPD with chronic hypoxic respiratory failure (HCC) 08/17/2022   First diagnosis 12/2020 upon admission for multifocal pneumonia   [place] pulmonary  PFTs 2022 confirmed obstruction and mild reduction in DLCO     History of radiation therapy    RUL lung and LLL lung 06/15/2021-06/24/2021  Dr Antony Blackbird   History of radiation therapy    Right Lung-05/02/23-06/13/23   Hx of cervical spine fracture 2/2 fall managed with neck brace 08/20/2022   Hx of herpes zoster infection of R thoracic region; no neuropathic pain    Non-small cell lung cancer (NSCLC) (HCC) 12/2020   Pneumonia    Rhinorrhea, chronic right unilateral 02/17/2023   Sepsis (HCC) 12/18/2020  ALLERGIES:  is allergic to penicillins.  MEDICATIONS:  Current Outpatient Medications  Medication Sig Dispense Refill   ipratropium (ATROVENT) 0.06 % nasal spray Place 2 sprays into both nostrils 2 (two) times daily as needed for rhinitis. 15 mL 5   OXYGEN Inhale 3 L into the lungs as needed. When 02 sats drop below 92     No current facility-administered medications for this  visit.    SURGICAL HISTORY:  Past Surgical History:  Procedure Laterality Date   BRONCHIAL BIOPSY  03/09/2021   Procedure: BRONCHIAL BIOPSIES;  Surgeon: Leslye Peer, MD;  Location: [facility] ENDOSCOPY;  Service: Pulmonary;;   BRONCHIAL BIOPSY  04/04/2023   Procedure: BRONCHIAL BIOPSIES;  Surgeon: Leslye Peer, MD;  Location: [facility] ENDOSCOPY;  Service: Pulmonary;;   BRONCHIAL BRUSHINGS  03/09/2021   Procedure: BRONCHIAL BRUSHINGS;  Surgeon: Leslye Peer, MD;  Location: [facility] ENDOSCOPY;  Service: Pulmonary;;   BRONCHIAL BRUSHINGS  04/04/2023   Procedure: BRONCHIAL BRUSHINGS;  Surgeon: Leslye Peer, MD;  Location: [facility] ENDOSCOPY;  Service: Pulmonary;;   BRONCHIAL NEEDLE ASPIRATION BIOPSY  03/09/2021   Procedure: BRONCHIAL NEEDLE ASPIRATION BIOPSIES;  Surgeon: Leslye Peer, MD;  Location: [facility] ENDOSCOPY;  Service: Pulmonary;;   BRONCHIAL NEEDLE ASPIRATION BIOPSY  04/04/2023   Procedure: BRONCHIAL NEEDLE ASPIRATION BIOPSIES;  Surgeon: Leslye Peer, MD;  Location: [facility] ENDOSCOPY;  Service: Pulmonary;;   DILATION AND CURETTAGE OF UTERUS     ENDOBRONCHIAL ULTRASOUND Right 04/04/2023   Procedure: ENDOBRONCHIAL ULTRASOUND WITH FLURO;  Surgeon: Leslye Peer, MD;  Location: [facility] ENDOSCOPY;  Service: Pulmonary;  Laterality: Right;   EYE SURGERY     bilateral cataract removal   FIDUCIAL MARKER PLACEMENT  04/04/2023   Procedure: FIDUCIAL MARKER PLACEMENT;  Surgeon: Leslye Peer, MD;  Location: [facility] ENDOSCOPY;  Service: Pulmonary;;   TONSILLECTOMY     VIDEO BRONCHOSCOPY WITH ENDOBRONCHIAL NAVIGATION Bilateral 03/09/2021   Procedure: VIDEO BRONCHOSCOPY WITH ENDOBRONCHIAL NAVIGATION;  Surgeon: Leslye Peer, MD;  Location: [facility] ENDOSCOPY;  Service: Pulmonary;  Laterality: Bilateral;  ION w/ possible fiducial    REVIEW OF SYSTEMS:  Constitutional: positive for fatigue Eyes: negative Ears, nose, mouth, throat, and face: negative Respiratory: positive for dyspnea on exertion Cardiovascular:  negative Gastrointestinal: positive for odynophagia Genitourinary:negative Integument/breast: negative Hematologic/lymphatic: negative Musculoskeletal:negative Neurological: negative Behavioral/Psych: negative Endocrine: negative Allergic/Immunologic: negative   PHYSICAL EXAMINATION: General appearance: alert, cooperative, fatigued, and no distress Head: Normocephalic, without obvious abnormality, atraumatic Neck: no adenopathy, no JVD, supple, symmetrical, trachea midline, and thyroid not enlarged, symmetric, no tenderness/mass/nodules Lymph nodes: Cervical, supraclavicular, and axillary nodes normal. Resp: clear to auscultation bilaterally Back: symmetric, no curvature. ROM normal. No CVA tenderness. Cardio: regular rate and rhythm, S1, S2 normal, no murmur, click, rub or gallop GI: soft, non-tender; bowel sounds normal; no masses,  no organomegaly Extremities: extremities normal, atraumatic, no cyanosis or edema Neurologic: Alert and oriented X 3, normal strength and tone. Normal symmetric reflexes. Normal coordination and gait  ECOG PERFORMANCE STATUS: 1 - Symptomatic but completely ambulatory  Blood pressure 122/71, pulse 82, temperature 97.8 F (36.6 C), temperature source Temporal, resp. rate 16, height 5\' 4"  (1.626 m), weight 131 lb (59.4 kg), SpO2 100%.  LABORATORY DATA: Lab Results  Component Value Date   WBC 5.5 07/14/2023   HGB 11.3 (L) 07/14/2023   HCT 36.0 07/14/2023   MCV 111.1 (H) 07/14/2023   PLT 200 07/14/2023      Chemistry      Component Value Date/Time   NA 138  06/13/2023 1410   K 4.4 06/13/2023 1410   CL 98 06/13/2023 1410   CO2 36 (H) 06/13/2023 1410   BUN 12 06/13/2023 1410   CREATININE 0.52 06/13/2023 1410      Component Value Date/Time   CALCIUM 9.6 06/13/2023 1410   ALKPHOS 79 06/13/2023 1410   AST 37 06/13/2023 1410   ALT 27 06/13/2023 1410   BILITOT 0.4 06/13/2023 1410       RADIOGRAPHIC STUDIES: CT Chest W Contrast Result Date:  07/14/2023 CLINICAL DATA:  Non-small cell lung cancer.  * Tracking Code: BO * EXAM: CT CHEST WITH CONTRAST TECHNIQUE: Multidetector CT imaging of the chest was performed during intravenous contrast administration. RADIATION DOSE REDUCTION: This exam was performed according to the departmental dose-optimization program which includes automated exposure control, adjustment of the mA and/or kV according to patient size and/or use of iterative reconstruction technique. CONTRAST:  75mL OMNIPAQUE IOHEXOL 300 MG/ML  SOLN COMPARISON:  PET-CT 01/27/2023, CT 03/28/2023 FINDINGS: Cardiovascular: No significant vascular findings. Normal heart size. No pericardial effusion. Mediastinum/Nodes: No axillary or supraclavicular adenopathy. No mediastinal or hilar adenopathy. No pericardial fluid. Esophagus normal. Lungs/Pleura: The suspicious hypermetabolic nodule in the anterior RIGHT lower lobe is decreased following therapy. There are fiducial markers at this site. Nodule measures 8 mm in greatest dimension compared to 12 mm on prior remeasured (image 117/6). Nodule superior segment of the LEFT lower lobe measures 13 mm and unchanged (image 78/6) Stable nodular apical thickening at the RIGHT lung apex. No new pulmonary nodules are present. Upper Abdomen: Limited view of the liver, kidneys, pancreas are unremarkable. Normal adrenal glands. Musculoskeletal: Posterior RIGHT third and fourth rib fracture (image 31/series 2). Findings unchanged from comparison exam IMPRESSION: 1. Decreased size of RIGHT lower lobe nodule following radiation therapy. 2. No new pulmonary nodules. 3. No evidence of metastatic adenopathy in the chest. 4. Stable nodular apical thickening at the RIGHT lung apex. Stable LEFT lobe pulmonary nodule. 5. Stable posterior RIGHT third and fourth rib fractures. Electronically Signed   By: Genevive Bi M.D.   On: 07/14/2023 11:22     ASSESSMENT AND PLAN: This is a very pleasant 89 years old white female with  a stage IV (T2b, N0, M1 a) non-small cell lung cancer, squamous cell carcinoma diagnosed in November 2022 and presented with right upper lobe lung mass and adjacent nodule in addition to synchronous primary versus oligometastatic nodule in the left lower lobe diagnosed and November 2022. Molecular studies showed no actionable mutations and PD-L1 expression of 1%. The patient status post SBRT to the left and right lung nodule under the care of Dr. Roselind Messier. The patient has been in observation and she was feeling fine except for the baseline shortness of breath. She had evidence for disease recurrence with non-small cell lung cancer, squamous cell carcinoma in December 2024. The patient underwent a course of concurrent chemoradiation with weekly carboplatin for AUC of 2 and paclitaxel 45 Mg/M2.  She is status post 7 cycles.  Last dose was given on June 13, 2023 She tolerated her treatment fairly well. She had repeat CT scan of the chest performed recently.  I personally and independently reviewed the scan and discussed the result with the patient and her family.  Her scan showed decrease in the size of the right upper lobe nodule with no new pulmonary nodules and no evidence of metastatic adenopathy in the chest.    Lung cancer Stage IV (T2b, N0, M1 a) non-small cell lung cancer with unclear  subtype on the pathology report diagnosed in November 2022 and presented with right upper lobe lung mass as well as left upper lobe nodule in addition to right lower lobe nodule. In October 2024 she was found to have suspicious metachronous primary with RLL nodule and subcarinal node vs. Metastatic disease which was biopsied and consistent with Squamous cell carcinoma.   Lung cancer with a right upper lobe nodule, initially diagnosed in November 2022. She completed seven cycles of chemotherapy and radiation, with the last treatment a month ago. Recent scans show a decrease in the nodule size with no new nodules or  metastatic disease. Treatment has been effective, and she is recovering well with minimal side effects, including hair thinning and weight loss due to acid reflux, which resolved after discontinuing Carafate. Two options were discussed: observation with regular scans or consolidation immunotherapy with Imfinzi (durvalumab). Immunotherapy could prevent recurrence but carries risks such as pneumonitis, diarrhea, rash, and hypothyroidism. Given her good response and breathing issues, observation was chosen to avoid potential adverse effects. - Monitor with scans every three months to assess for changes in the nodule or new disease development. - Opt for observation instead of consolidation immunotherapy due to potential side effects and current good response.  Acid reflux Significant pain when eating, attributed to acid reflux, led to weight loss. Carafate was prescribed but worsened symptoms, leading to its discontinuation and resolution of pain.   The patient was advised to call immediately if she has any concerning symptoms in the interval. The patient voices understanding of current disease status and treatment options and is in agreement with the current care plan.  All questions were answered. The patient knows to call the clinic with any problems, questions or concerns. We can certainly see the patient much sooner if necessary.   Disclaimer: This note was dictated with voice recognition software. Similar sounding words can inadvertently be transcribed and may not be corrected upon review.

## 2023-10-04 ENCOUNTER — Ambulatory Visit (HOSPITAL_COMMUNITY): Admission: RE | Admit: 2023-10-04 | Source: Ambulatory Visit

## 2023-10-12 ENCOUNTER — Encounter (HOSPITAL_COMMUNITY): Payer: Self-pay

## 2023-10-12 ENCOUNTER — Ambulatory Visit (HOSPITAL_COMMUNITY)
Admission: RE | Admit: 2023-10-12 | Discharge: 2023-10-12 | Disposition: A | Discharge status: Home / Self Care | Source: Ambulatory Visit | Attending: Internal Medicine | Admitting: Internal Medicine

## 2023-10-12 ENCOUNTER — Ambulatory Visit

## 2023-10-12 VITALS — Ht 64.0 in | Wt 130.0 lb

## 2023-10-12 DIAGNOSIS — C3490 Malignant neoplasm of unspecified part of unspecified bronchus or lung: Secondary | ICD-10-CM | POA: Insufficient documentation

## 2023-10-12 DIAGNOSIS — Z0000 Encounter for general adult medical examination without abnormal findings: Secondary | ICD-10-CM

## 2023-10-12 MED ORDER — IOHEXOL 300 MG/ML  SOLN
75.0000 mL | Freq: Once | INTRAMUSCULAR | Status: AC | PRN
  Administered 2023-10-12: 75 mL via INTRAVENOUS

## 2023-10-12 MED ORDER — SODIUM CHLORIDE (PF) 0.9 % IJ SOLN
INTRAMUSCULAR | Status: AC
  Filled 2023-10-12: qty 50

## 2023-10-12 NOTE — Progress Notes (Signed)
 Because this visit was a virtual/telehealth visit,  certain criteria was not obtained, such a blood pressure, CBG if applicable, and timed get up and go. Any medications not marked as taking were not mentioned during the medication reconciliation part of the visit. Any vitals not documented were not able to be obtained due to this being a telehealth visit or patient was unable to self-report a recent blood pressure reading due to a lack of equipment at home via telehealth. Vitals that have been documented are verbally provided by the patient.   Subjective:   Amy Sandoval is a 89 y.o. who presents for a Medicare Wellness preventive visit.  As a reminder, Annual Wellness Visits don't include a physical exam, and some assessments may be limited, especially if this visit is performed virtually. We may recommend an in-person follow-up visit with your provider if needed.  Visit Complete: Virtual I connected with  Amy Sandoval on 10/12/23 by a audio enabled telemedicine application and verified that I am speaking with the correct person using two identifiers.  Patient Location: Home  Provider Location: Office/Clinic  I discussed the limitations of evaluation and management by telemedicine. The patient expressed understanding and agreed to proceed.  Vital Signs: Because this visit was a virtual/telehealth visit, some criteria may be missing or patient reported. Any vitals not documented were not able to be obtained and vitals that have been documented are patient reported.  VideoDeclined- This patient declined Librarian, academic. Therefore the visit was completed with audio only.  Persons Participating in Visit: Patient.  AWV Questionnaire: No: Patient Medicare AWV questionnaire was not completed prior to this visit.  Cardiac Risk Factors include: advanced age (>15men, >61 women);family history of premature cardiovascular disease;sedentary lifestyle      Objective:     Today's Vitals   10/12/23 1304  Weight: 130 lb (59 kg)  Height: 5' 4 (1.626 m)  PainSc: 0-No pain   Body mass index is 22.31 kg/m.     10/12/2023    1:06 PM 07/14/2023    8:54 AM 04/22/2023    8:24 AM 04/04/2023   11:58 AM 08/17/2022   11:05 AM 08/17/2022    8:55 AM 03/15/2022    2:35 PM  Advanced Directives  Does Patient Have a Medical Advance Directive? No No No No No No No  Would patient like information on creating a medical advance directive? No - Patient declined No - Patient declined No - Patient declined No - Patient declined No - Patient declined No - Patient declined Yes (ED - Information included in AVS)    Current Medications (verified) Outpatient Encounter Medications as of 10/12/2023  Medication Sig   ipratropium (ATROVENT ) 0.06 % nasal spray Place 2 sprays into both nostrils 2 (two) times daily as needed for rhinitis.   OXYGEN Inhale 3 L into the lungs as needed. When 02 sats drop below 92   [EXPIRED] iohexol  (OMNIPAQUE ) 300 MG/ML solution 75 mL    No facility-administered encounter medications on file as of 10/12/2023.    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) 08/17/2022   First diagnosis 12/2020 upon admission for multifocal pneumonia   [place] [facility]  PFTs 2022 confirmed obstruction and mild reduction in DLCO     History of radiation therapy    RUL lung and LLL lung 06/15/2021-06/24/2021  Dr  Retta Caster   History of radiation therapy    Right Lung-05/02/23-06/13/23   Hx of cervical spine fracture 2/2 fall managed with neck brace 08/20/2022   Hx of herpes zoster infection of R thoracic region; no neuropathic pain    Non-small cell lung cancer (NSCLC) (HCC) 12/2020   Pneumonia    Rhinorrhea, chronic right unilateral 02/17/2023   Sepsis (HCC) 12/18/2020   Past Surgical  History:  Procedure Laterality Date   BRONCHIAL BIOPSY  03/09/2021   Procedure: BRONCHIAL BIOPSIES;  Surgeon: Denson Flake, MD;  Location: [facility] ENDOSCOPY;  Service: Pulmonary;;   BRONCHIAL BIOPSY  04/04/2023   Procedure: BRONCHIAL BIOPSIES;  Surgeon: Denson Flake, MD;  Location: [facility] ENDOSCOPY;  Service: Pulmonary;;   BRONCHIAL BRUSHINGS  03/09/2021   Procedure: BRONCHIAL BRUSHINGS;  Surgeon: Denson Flake, MD;  Location[facility] ENDOSCOPY;  Service: Pulmonary;;   BRONCHIAL BRUSHINGS  04/04/2023   Procedure: BRONCHIAL BRUSHINGS;  Surgeon: Denson Flake, MD;  Location: [facility] ENDOSCOPY;  Service: Pulmonary;;   BRONCHIAL NEEDLE ASPIRATION BIOPSY  03/09/2021   Procedure: BRONCHIAL NEEDLE ASPIRATION BIOPSIES;  Surgeon: Denson Flake, MD;  Location: [facility] ENDOSCOPY;  Service: Pulmonary;;   BRONCHIAL NEEDLE ASPIRATION BIOPSY  04/04/2023   Procedure: BRONCHIAL NEEDLE ASPIRATION BIOPSIES;  Surgeon: Denson Flake, MD;  Location: [facility] ENDOSCOPY;  Service: Pulmonary;;   DILATION AND CURETTAGE OF UTERUS     ENDOBRONCHIAL ULTRASOUND Right 04/04/2023   Procedure: ENDOBRONCHIAL ULTRASOUND WITH FLURO;  Surgeon: Denson Flake, MD;  Location: [facility] ENDOSCOPY;  Service: Pulmonary;  Laterality: Right;   EYE SURGERY     bilateral cataract removal   FIDUCIAL MARKER PLACEMENT  04/04/2023   Procedure: FIDUCIAL MARKER PLACEMENT;  Surgeon: Denson Flake, MD;  Location: [facility] ENDOSCOPY;  Service: Pulmonary;;   TONSILLECTOMY     VIDEO BRONCHOSCOPY WITH ENDOBRONCHIAL NAVIGATION Bilateral 03/09/2021   Procedure: VIDEO BRONCHOSCOPY WITH ENDOBRONCHIAL NAVIGATION;  Surgeon: Denson Flake, MD;  Location: [facility] ENDOSCOPY;  Service: Pulmonary;  Laterality: Bilateral;  ION w/ possible fiducial   Family History  Problem Relation Age of Onset   Hypertension Other    Social History   Socioeconomic History   Marital status: Single    Spouse name: Not on file   Number of children: Not on file   Years of education: Not on file   Highest  education level: Not on file  Occupational History   Not on file  Tobacco Use   Smoking status: Former    Current packs/day: 1.00    Average packs/day: 1 pack/day for 3.4 years (3.4 ttl pk-yrs)    Types: Cigarettes    Start date: 2022    Passive exposure: Past   Smokeless tobacco: Never  Vaping Use   Vaping status: Former   Substances: Nicotine , Flavoring  Substance and Sexual Activity   Alcohol use: Yes    Comment: Social   Drug use: Never   Sexual activity: Not on file  Other Topics Concern   Not on file  Social History Narrative   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  [place], has two daughters.  Was an avid traveller ([place] for many years, a benefit through supplier. Stopped long-term smoking at the time of a fall in 03/2022;  smokes a few cigs on occasion, but doesn't crave.  Drinks a 1/2 beer at night.  JW/08/2022   Social Drivers of Corporate investment banker Strain: Low Risk  (  10/12/2023)   Overall Financial Resource Strain (CARDIA)    Difficulty of Paying Living Expenses: Not hard at all  Food Insecurity: No Food Insecurity (10/12/2023)   Hunger Vital Sign    Worried About Running Out of Food in the Last Year: Never true    Ran Out of Food in the Last Year: Never true  Transportation Needs: No Transportation Needs (10/12/2023)   PRAPARE - Administrator, Civil Service (Medical): No    Lack of Transportation (Non-Medical): No  Physical Activity: Inactive (10/12/2023)   Exercise Vital Sign    Days of Exercise per Week: 0 days    Minutes of Exercise per Session: 0 min  Stress: No Stress Concern Present (10/12/2023)   [employer] of Occupational Health - Occupational Stress Questionnaire    Feeling of Stress : Not at all  Social Connections: Socially Isolated (10/12/2023)   Social Connection and Isolation Panel [NHANES]    Frequency of Communication with Friends and Family: More than  three times a week    Frequency of Social Gatherings with Friends and Family: Once a week    Attends Religious Services: Never    Database administrator or Organizations: No    Attends Engineer, structural: Never    Marital Status: Divorced    Tobacco Counseling Counseling given: Not Answered    Clinical Intake:  Pre-visit preparation completed: Yes  Pain : No/denies pain Pain Score: 0-No pain     BMI - recorded: 22.31 Nutritional Status: BMI of 19-24  Normal Nutritional Risks: None Diabetes: No  No results found for: HGBA1C   How often do you need to have someone help you when you read instructions, pamphlets, or other written materials from your doctor or pharmacy?: 1 - Never  Interpreter Needed?: No  Information entered by :: Giara Mcgaughey N. Rommie Dunn, LPN.   Activities of Daily Living     10/12/2023    1:08 PM  In your present state of health, do you have any difficulty performing the following activities:  Hearing? 0  Vision? 0  Difficulty concentrating or making decisions? 0  Walking or climbing stairs? 0  Dressing or bathing? 0  Doing errands, shopping? 0  Preparing Food and eating ? N  Using the Toilet? N  In the past six months, have you accidently leaked urine? Y  Do you have problems with loss of bowel control? N  Managing your Medications? N  Managing your Finances? N  Housekeeping or managing your Housekeeping? N    Patient Care Team: [name], MD as PCP - General (Internal Medicine) Retta Caster, MD as Consulting Physician (Radiation Oncology) Marlene Simas, MD as Consulting Physician (Oncology) Denson Flake, MD as Consulting Physician (Pulmonary Disease)  I have updated your Care Teams any recent Medical Services you may have received from other providers in the past year.     Assessment:    This is a routine wellness examination for Amy Sandoval.  Hearing/Vision screen Hearing Screening - Comments:: Denies hearing  difficulties.  Vision Screening - Comments:: No rx glasses, had cataract surgery- not up to date with routine eye exams with [facility].    Goals Addressed             This Visit's Progress    Patient Stated: My goal for 2025 is to live through it!         Depression Screen     10/12/2023    1:09 PM 04/22/2023  8:30 AM 08/17/2022   11:06 AM 08/17/2022   10:56 AM  PHQ 2/9 Scores  PHQ - 2 Score 0 0 0 0  PHQ- 9 Score 0       Fall Risk     10/12/2023    1:06 PM 08/17/2022   11:05 AM 08/17/2022    8:54 AM  Fall Risk   Falls in the past year? 0 1 1  Number falls in past yr: 0 1 0  Injury with Fall? 0 0 1  Risk for fall due to : No Fall Risks    Follow up Falls evaluation completed Falls evaluation completed;Falls prevention discussed Falls evaluation completed    MEDICARE RISK AT HOME:  Medicare Risk at Home Any stairs in or around the home?: Yes If so, are there any without handrails?: No Home free of loose throw rugs in walkways, pet beds, electrical cords, etc?: Yes Adequate lighting in your home to reduce risk of falls?: Yes Life alert?: No Use of a cane, walker or w/c?: No Grab bars in the bathroom?: No Shower chair or bench in shower?: No Elevated toilet seat or a handicapped toilet?: No  TIMED UP AND GO:  Was the test performed?  No  Cognitive Function: 6CIT completed    10/12/2023    1:09 PM  MMSE - Mini Mental State Exam  Not completed: Unable to complete        10/12/2023    1:17 PM  6CIT Screen  What Year? 0 points  What month? 0 points  What time? 0 points  Count back from 20 0 points  Months in reverse 0 points  Repeat phrase 0 points  Total Score 0 points    Immunizations Immunization History  Administered Date(s) Administered   PFIZER Comirnaty(Gray Top)Covid-19 Tri-Sucrose Vaccine 02/01/2020, 03/23/2020, 10/01/2020, 03/30/2021    Screening Tests Health Maintenance  Topic Date Due   DTaP/Tdap/Td (1 - Tdap) Never  done   Pneumonia Vaccine 80+ Years old (1 of 2 - PCV) Never done   Zoster Vaccines- Shingrix (1 of 2) Never done   DEXA SCAN  Never done   COVID-19 Vaccine (5 - 2024-25 season) 01/02/2023   INFLUENZA VACCINE  12/02/2023   Medicare Annual Wellness (AWV)  10/11/2024   HPV VACCINES  Aged Out   Meningococcal B Vaccine  Aged Out    Health Maintenance  Health Maintenance Due  Topic Date Due   DTaP/Tdap/Td (1 - Tdap) Never done   Pneumonia Vaccine 85+ Years old (1 of 2 - PCV) Never done   Zoster Vaccines- Shingrix (1 of 2) Never done   DEXA SCAN  Never done   COVID-19 Vaccine (5 - 2024-25 season) 01/02/2023   Health Maintenance Items Addressed: Yes Patient aware of current care gaps.  Patient declined vaccines except Covid.  Additional Screening:  Vision Screening: Recommended annual ophthalmology exams for early detection of glaucoma and other disorders of the eye. Would you like a referral to an eye doctor? No    Dental Screening: Recommended annual dental exams for proper oral hygiene  Community Resource Referral / Chronic Care Management: CRR required this visit?  No   CCM required this visit?  No   Plan:    I have personally reviewed and noted the following in the patient's chart:   Medical and social history Use of alcohol, tobacco or illicit drugs  Current medications and supplements including opioid prescriptions. Patient is not currently taking opioid prescriptions. Functional ability and status Nutritional status  Physical activity Advanced directives List of other physicians Hospitalizations, surgeries, and ER visits in previous 12 months Vitals Screenings to include cognitive, depression, and falls Referrals and appointments  In addition, I have reviewed and discussed with patient certain preventive protocols, quality metrics, and best practice recommendations. A written personalized care plan for preventive services as well as general preventive health  recommendations were provided to patient.   Amy Sheldon, LPN   [id number]   After Visit Summary: (MyChart) Due to this being a telephonic visit, the after visit summary with patients personalized plan was offered to patient via MyChart   Notes: Patient aware of current care gaps.  Patient declined vaccines except Covid.

## 2023-10-12 NOTE — Patient Instructions (Signed)
 Ms. Amy Sandoval , Thank you for taking time out of your busy schedule to complete your Annual Wellness Visit with me. I enjoyed our conversation and look forward to speaking with you again next year. I, as well as your care team,  appreciate your ongoing commitment to your health goals. Please review the following plan we discussed and let me know if I can assist you in the future. Your Game plan/ To Do List    Referrals: If you haven't heard from the office you've been referred to, please reach out to them at the phone provided.   Follow up Visits: Next Medicare AWV with our clinical staff: 10/17/2024 at 1:00pm phone visit with Nurse Health Advisor   Have you seen your provider in the last 6 months (3 months if uncontrolled diabetes)? No Next Office Visit with your provider: Office will call patient to schedule  Clinician Recommendations:  Aim for 30 minutes of exercise or brisk walking, 6-8 glasses of water, and 5 servings of fruits and vegetables each day.       This is a list of the screening recommended for you and due dates:  Health Maintenance  Topic Date Due   DTaP/Tdap/Td vaccine (1 - Tdap) Never done   Pneumonia Vaccine (1 of 2 - PCV) Never done   Zoster (Shingles) Vaccine (1 of 2) Never done   DEXA scan (bone density measurement)  Never done   COVID-19 Vaccine (5 - 2024-25 season) 01/02/2023   Flu Shot  12/02/2023   Medicare Annual Wellness Visit  10/11/2024   HPV Vaccine  Aged Out   Meningitis B Vaccine  Aged Out    Advanced directives: (Declined) Advance directive discussed with you today. Even though you declined this today, please call our office should you change your mind, and we can give you the proper paperwork for you to fill out. Advance Care Planning is important because it:  [x]  Makes sure you receive the medical care that is consistent with your values, goals, and preferences  [x]  It provides guidance to your family and loved ones and reduces their decisional  burden about whether or not they are making the right decisions based on your wishes.  Follow the link provided in your after visit summary or read over the paperwork we have mailed to you to help you started getting your Advance Directives in place. If you need assistance in completing these, please reach out to us  so that we can help you!  See attachments for Preventive Care and Fall Prevention Tips.

## 2023-10-13 ENCOUNTER — Inpatient Hospital Stay: Attending: Physician Assistant

## 2023-10-13 DIAGNOSIS — C3411 Malignant neoplasm of upper lobe, right bronchus or lung: Secondary | ICD-10-CM | POA: Diagnosis present

## 2023-10-13 DIAGNOSIS — Z9221 Personal history of antineoplastic chemotherapy: Secondary | ICD-10-CM | POA: Insufficient documentation

## 2023-10-13 DIAGNOSIS — C3490 Malignant neoplasm of unspecified part of unspecified bronchus or lung: Secondary | ICD-10-CM

## 2023-10-13 DIAGNOSIS — Z923 Personal history of irradiation: Secondary | ICD-10-CM | POA: Insufficient documentation

## 2023-10-13 LAB — CMP (CANCER CENTER ONLY)
ALT: 18 U/L (ref 0–44)
AST: 28 U/L (ref 15–41)
Albumin: 4.1 g/dL (ref 3.5–5.0)
Alkaline Phosphatase: 102 U/L (ref 38–126)
Anion gap: 4 — ABNORMAL LOW (ref 5–15)
BUN: 10 mg/dL (ref 8–23)
CO2: 38 mmol/L — ABNORMAL HIGH (ref 22–32)
Calcium: 9.2 mg/dL (ref 8.9–10.3)
Chloride: 99 mmol/L (ref 98–111)
Creatinine: 0.58 mg/dL (ref 0.44–1.00)
GFR, Estimated: >60 mL/min (ref >60)
Glucose, Bld: 106 mg/dL — ABNORMAL HIGH (ref 70–99)
Potassium: 4.4 mmol/L (ref 3.5–5.1)
Sodium: 141 mmol/L (ref 135–145)
Total Bilirubin: 0.4 mg/dL (ref 0.0–1.2)
Total Protein: 6.9 g/dL (ref 6.5–8.1)

## 2023-10-13 LAB — CBC WITH DIFFERENTIAL (CANCER CENTER ONLY)
Abs Immature Granulocytes: 0.01 10*3/uL (ref 0.00–0.07)
Basophils Absolute: 0 10*3/uL (ref 0.0–0.1)
Basophils Relative: 1 %
Eosinophils Absolute: 0.1 10*3/uL (ref 0.0–0.5)
Eosinophils Relative: 2 %
HCT: 39.3 % (ref 36.0–46.0)
Hemoglobin: 12.4 g/dL (ref 12.0–15.0)
Immature Granulocytes: 0 %
Lymphocytes Relative: 28 %
Lymphs Abs: 1.4 10*3/uL (ref 0.7–4.0)
MCH: 33.5 pg (ref 26.0–34.0)
MCHC: 31.6 g/dL (ref 30.0–36.0)
MCV: 106.2 fL — ABNORMAL HIGH (ref 80.0–100.0)
Monocytes Absolute: 0.6 10*3/uL (ref 0.1–1.0)
Monocytes Relative: 12 %
Neutro Abs: 2.8 10*3/uL (ref 1.7–7.7)
Neutrophils Relative %: 57 %
Platelet Count: 160 10*3/uL (ref 150–400)
RBC: 3.7 MIL/uL — ABNORMAL LOW (ref 3.87–5.11)
RDW: 12.3 % (ref 11.5–15.5)
WBC Count: 4.9 10*3/uL (ref 4.0–10.5)
nRBC: 0 % (ref 0.0–0.2)

## 2023-10-20 ENCOUNTER — Inpatient Hospital Stay (HOSPITAL_BASED_OUTPATIENT_CLINIC_OR_DEPARTMENT_OTHER): Admitting: Internal Medicine

## 2023-10-20 VITALS — BP 114/66 | HR 70 | Temp 97.7°F | Resp 16 | Wt 124.6 lb

## 2023-10-20 DIAGNOSIS — C3490 Malignant neoplasm of unspecified part of unspecified bronchus or lung: Secondary | ICD-10-CM | POA: Diagnosis not present

## 2023-10-20 DIAGNOSIS — C3411 Malignant neoplasm of upper lobe, right bronchus or lung: Secondary | ICD-10-CM | POA: Diagnosis not present

## 2023-10-20 NOTE — Progress Notes (Signed)
 [facility] [phone]   [phone]  OFFICE PROGRESS NOTE  [name], MD [address] [place], [address] [place] [address]  DIAGNOSIS:  Stage IV (T2b, N0, M1 a) non-small cell lung cancer with unclear subtype on the pathology report diagnosed in November 2022 and presented with right upper lobe lung mass as well as left upper lobe nodule in addition to right lower lobe nodule. In October 2024 she was found to have suspicious metachronous primary with RLL nodule and subcarinal node vs. Metastatic disease which was biopsied and consistent with Squamous cell carcinoma.    DETECTED ALTERATION(S) / BIOMARKER(S)     % CFDNA OR AMPLIFICATION        ASSOCIATED FDA-APPROVED THERAPIES         CLINICAL TRIAL AVAILABILITY [id number] 0.1% NoneNo   PD-L1 expression 1%   PRIOR THERAPY:  1) SBRT to the left and right lung nodules under the care of Dr. Eloise Hake completed on June 24, 2021.  2) Concurrent chemoradiation with carboplatin  for an AUC of 2 paclitaxel  45 mg/m.  First dose expected on 05/02/2023 status post 6 cycles   CURRENT THERAPY: Observation.  INTERVAL HISTORY: Amy Sandoval 89 y.o. female returns to the clinic today for follow-up visit accompanied by a friend.Discussed the use of AI scribe software for clinical note transcription with the patient, who gave verbal consent to proceed.  History of Present Illness   Amy Sandoval is an 89 year old female with stage four non-small cell lung cancer who presents for evaluation with repeat CT scan for restaging of her disease.  She was diagnosed with stage four non-small cell lung cancer in October 2024, initially presenting as multifocal disease in November 2022. She underwent stereotactic body radiation therapy (SBRT) to left and right lung nodules in 2023 and completed a course of concurrent chemoradiation for locally advanced disease in the right lung in March 2025. She has been on  observation since then.  She describes a gradual decline in her condition since her last visit three months ago, with persistent fatigue and no improvement in strength or energy levels despite an improvement in hemoglobin levels. She has experienced unintentional weight loss of approximately five to six pounds, although she states she is 'eating good'.  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 She uses a portable oxygen tank to aid mobility.  Socially, she is supported by her friend, Joanette Moynahan, who accompanies her to appointments and previously delivered her mail at her shop.        MEDICAL HISTORY: Past Medical History:  Diagnosis Date   Arthritis    COPD (chronic obstructive pulmonary disease) (HCC)    COPD with chronic hypoxic respiratory failure (HCC) 08/17/2022   First diagnosis 12/2020 upon admission for multifocal pneumonia   [place] [facility]  PFTs 2022 confirmed obstruction and mild reduction in DLCO     History of radiation therapy    RUL lung and LLL lung 06/15/2021-06/24/2021  Dr Retta Caster   History of radiation therapy    Right Lung-05/02/23-06/13/23   Hx of cervical spine fracture 2/2 fall managed with neck brace 08/20/2022   Hx of herpes zoster infection of R thoracic region; no neuropathic pain    Non-small cell lung cancer (NSCLC) (HCC) 12/2020   Pneumonia    Rhinorrhea, chronic right unilateral 02/17/2023   Sepsis (HCC) 12/18/2020    ALLERGIES:  is allergic to penicillins.  MEDICATIONS:  Current Outpatient Medications  Medication Sig Dispense Refill   ipratropium (ATROVENT ) 0.06 % nasal spray Place 2 sprays into both nostrils 2 (two) times daily as needed for rhinitis. 15 mL 5   OXYGEN Inhale 3 L into the lungs as needed. When 02 sats drop below 92     No current facility-administered medications for this visit.    SURGICAL HISTORY:  Past Surgical History:  Procedure  Laterality Date   BRONCHIAL BIOPSY  03/09/2021   Procedure: BRONCHIAL BIOPSIES;  Surgeon: Denson Flake, MD;  Location: [facility] ENDOSCOPY;  Service: Pulmonary;;   BRONCHIAL BIOPSY  04/04/2023   Procedure: BRONCHIAL BIOPSIES;  Surgeon: Denson Flake, MD;  Location[facility] END[facility]COPY;  Service: Pulmonary;;   BRONCHIAL BRUSHINGS  03/09/2021   Procedure: BRONCHIAL BRUSHINGS;  Surgeon: Denson Flake, MD;  Location: [facility] ENDOSCOPY;  Service: Pulmonary;;   BRONCHIAL BRUSHINGS  04/04/2023   Procedure: BRONCHIAL BRUSHINGS;  Surgeon: Denson Flake, MD;  Location: [facility] ENDOSCOPY;  Service: Pulmonary;;   BRONCHIAL NEEDLE ASPIRATION BIOPSY  03/09/2021   Procedure: BRONCHIAL NEEDLE ASPIRATION BIOPSIES;  Surgeon: Denson Flake, MD;  Location: [facility] ENDOSCOPY;  Service: Pulmonary;;   BRONCHIAL NEEDLE ASPIRATION BIOPSY  04/04/2023   Procedure: BRONCHIAL NEEDLE ASPIRATION BIOPSIES;  Surgeon: Denson Flake, MD;  Location: [facility] ENDOSCOPY;  Service: Pulmonary;;   DILATION AND CURETTAGE OF UTERUS     ENDOBRONCHIAL ULTRASOUND Right 04/04/2023   Procedure: ENDOBRONCHIAL ULTRASOUND WITH FLURO;  Surgeon: Denson Flake, MD;  Location: [facility] ENDOSCOPY;  Service: Pulmonary;  Laterality: Right;   EYE SURGERY     bilateral cataract removal   FIDUCIAL MARKER PLACEMENT  04/04/2023   Procedure: FIDUCIAL MARKER PLACEMENT;  Surgeon: Denson Flake, MD;  Location: [facility] ENDOSCOPY;  Service: Pulmonary;;   TONSILLECTOMY     VIDEO BRONCHOSCOPY WITH ENDOBRONCHIAL NAVIGATION Bilateral 03/09/2021   Procedure: VIDEO BRONCHOSCOPY WITH ENDOBRONCHIAL NAVIGATION;  Surgeon: Denson Flake, MD;  Location: [facility] ENDOSCOPY;  Service: Pulmonary;  Laterality: Bilateral;  ION w/ possible fiducial    REVIEW OF SYSTEMS:  A comprehensive review of systems was negative except for: Constitutional: positive for fatigue Respiratory: positive for dyspnea on exertion   PHYSICAL EXAMINATION: General appearance: alert, cooperative, fatigued, and no distress Head:  Normocephalic, without obvious abnormality, atraumatic Neck: no adenopathy, no JVD, supple, symmetrical, trachea midline, and thyroid not enlarged, symmetric, no tenderness/mass/nodules Lymph nodes: Cervical, supraclavicular, and axillary nodes normal. Resp: clear to auscultation bilaterally Back: symmetric, no curvature. ROM normal. No CVA tenderness. Cardio: regular rate and rhythm, S1, S2 normal, no murmur, click, rub or gallop GI: soft, non-tender; bowel sounds normal; no masses,  no organomegaly Extremities: extremities normal, atraumatic, no cyanosis or edema  ECOG PERFORMANCE STATUS: 1 - Symptomatic but completely ambulatory  Blood pressure 114/66, pulse 70, temperature 97.7 F (36.5 C), temperature source Temporal, resp. rate 16, weight 124 lb 9.6 oz (56.5 kg), SpO2 95%.  LABORATORY DATA: Lab Results  Component Value Date   WBC 4.9 10/13/2023   HGB 12.4 10/13/2023   HCT 39.3 10/13/2023   MCV 106.2 (H) 10/13/2023   PLT 160 10/13/2023      Chemistry      Component Value Date/Time   NA 141 10/13/2023 1015   K 4.4 10/13/2023 1015   CL 99 10/13/2023 1015   CO2 38 (H) 10/13/2023 1015   BUN 10 10/13/2023 1015   CREATININE 0.58 10/13/2023 1015      Component Value Date/Time   CALCIUM 9.2 10/13/2023 1015  ALKPHOS 102 10/13/2023 1015   AST 28 10/13/2023 1015   ALT 18 10/13/2023 1015   BILITOT 0.4 10/13/2023 1015       RADIOGRAPHIC STUDIES: CT Chest W Contrast Result Date: 10/13/2023 CLINICAL DATA:  Non-small-cell lung cancer. Restaging. * Tracking Code: BO * EXAM: CT CHEST WITH CONTRAST TECHNIQUE: Multidetector CT imaging of the chest was performed during intravenous contrast administration. RADIATION DOSE REDUCTION: This exam was performed according to the departmental dose-optimization program which includes automated exposure control, adjustment of the mA and/or kV according to patient size and/or use of iterative reconstruction technique. CONTRAST:  75mL OMNIPAQUE   IOHEXOL  300 MG/ML  SOLN COMPARISON:  07/04/2023 FINDINGS: Cardiovascular: The heart size is normal. No substantial pericardial effusion. Coronary artery calcification is evident. Mild atherosclerotic calcification is noted in the wall of the thoracic aorta. Enlargement of the pulmonary outflow tract/main pulmonary arteries suggests pulmonary arterial hypertension. Mediastinum/Nodes: No mediastinal lymphadenopathy. There is no hilar lymphadenopathy. The esophagus has normal imaging features. There is no axillary lymphadenopathy. Lungs/Pleura: Centrilobular and paraseptal emphysema evident. 1.3 x 1.2 cm right apical nodule with adjacent pleuroparenchymal scarring is similar to prior and comparing back to 03/28/2023. Left lower lobe pulmonary nodule measured previously at 1.3 cm is stable at 1.3 cm today on image 77/6. Adjacent scarring from radiation therapy evident. Right lower lobe lesion with fiducial markers is similar to prior. Lesion was measured previously at 8 mm, unchanged when measured in a similar fashion on today's study. No new suspicious pulmonary nodule or mass. No new consolidation. No pleural effusion. Upper Abdomen: Simple cyst noted upper pole left kidney. No followup imaging is recommended. The visualized portion of the upper abdomen is otherwise unremarkable. Musculoskeletal: No worrisome lytic or sclerotic osseous abnormality. Stable appearance posterior right third and fourth rib fractures. IMPRESSION: 1. Stable exam. No new or progressive findings. 2. Stable 1.3 cm right apical nodule with adjacent pleuroparenchymal scarring. 3. No change 1.3 cm left lower lobe pulmonary nodule with adjacent scarring from radiation therapy. 4. 8 mm right lower lobe lesion with fiducial markers is stable since prior. 5. Enlargement of the pulmonary outflow tract/main pulmonary arteries suggests pulmonary arterial hypertension. 6. Aortic Atherosclerosis (ICD10-I70.0) and Emphysema (ICD10-J43.9). Electronically  Signed   By: Donnal Fusi M.D.   On: 10/13/2023 06:48     ASSESSMENT AND PLAN: This is a very pleasant 89 years old white female with a stage IV (T2b, N0, M1 a) non-small cell lung cancer, squamous cell carcinoma diagnosed in November 2022 and presented with right upper lobe lung mass and adjacent nodule in addition to synchronous primary versus oligometastatic nodule in the left lower lobe diagnosed and November 2022. Molecular studies showed no actionable mutations and PD-L1 expression of 1%. The patient status post SBRT to the left and right lung nodule under the care of Dr. Eloise Hake. The patient has been in observation and she was feeling fine except for the baseline shortness of breath. She had evidence for disease recurrence with non-small cell lung cancer, squamous cell carcinoma in December 2024. The patient underwent a course of concurrent chemoradiation with weekly carboplatin  for AUC of 2 and paclitaxel  45 Mg/M2.  She is status post 7 cycles.  Last dose was given on June 13, 2023 She tolerated her treatment fairly well. The patient is currently on observation and she is feeling fine with no concerning complaints. She had repeat CT scan of the chest performed recently.  I personally independently reviewed the scan and discussed the result with the patient  today. Her scan showed no concerning findings for disease progression. Assessment and Plan    Stage 4 non-small cell lung cancer Stage 4 non-small cell lung cancer, initially diagnosed in October 2024, with multifocal disease presentation in November 2022. Status post SPRT to left and right lung nodules in 2023 and completed concurrent chemoradiation for locally advanced disease in the right lung in March 2025. Recent CT scan shows no evidence of disease progression or new growths. Reports a slow decline in energy levels and weight loss of 5-6 pounds, but maintains good appetite and overall health. 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 Encouraged to increase activity levels despite limitations due to COPD. Portable oxygen tank available to facilitate mobility. - Encourage increased physical activity with portable oxygen tank   The patient was advised to call immediately if she has any concerning symptoms in the interval. The patient voices understanding of current disease status and treatment options and is in agreement with the current care plan.  All questions were answered. The patient knows to call the clinic with any problems, questions or concerns. We can certainly see the patient much sooner if necessary.   Disclaimer: This note was dictated with voice recognition software. Similar sounding words can inadvertently be transcribed and may not be corrected upon review.

## 2024-01-10 ENCOUNTER — Inpatient Hospital Stay: Attending: Physician Assistant

## 2024-01-10 ENCOUNTER — Ambulatory Visit (HOSPITAL_COMMUNITY)
Admission: RE | Admit: 2024-01-10 | Discharge: 2024-01-10 | Disposition: A | Discharge status: Home / Self Care | Source: Ambulatory Visit | Attending: Internal Medicine | Admitting: Internal Medicine

## 2024-01-10 ENCOUNTER — Encounter (HOSPITAL_COMMUNITY): Payer: Self-pay

## 2024-01-10 DIAGNOSIS — Z923 Personal history of irradiation: Secondary | ICD-10-CM | POA: Insufficient documentation

## 2024-01-10 DIAGNOSIS — C3411 Malignant neoplasm of upper lobe, right bronchus or lung: Secondary | ICD-10-CM | POA: Diagnosis present

## 2024-01-10 DIAGNOSIS — C3490 Malignant neoplasm of unspecified part of unspecified bronchus or lung: Secondary | ICD-10-CM | POA: Diagnosis present

## 2024-01-10 DIAGNOSIS — J449 Chronic obstructive pulmonary disease, unspecified: Secondary | ICD-10-CM | POA: Diagnosis not present

## 2024-01-10 DIAGNOSIS — Z9221 Personal history of antineoplastic chemotherapy: Secondary | ICD-10-CM | POA: Diagnosis not present

## 2024-01-10 DIAGNOSIS — Z9981 Dependence on supplemental oxygen: Secondary | ICD-10-CM | POA: Diagnosis not present

## 2024-01-10 LAB — CBC WITH DIFFERENTIAL (CANCER CENTER ONLY)
Abs Immature Granulocytes: 0.01 K/uL (ref 0.00–0.07)
Basophils Absolute: 0 K/uL (ref 0.0–0.1)
Basophils Relative: 0 %
Eosinophils Absolute: 0.1 K/uL (ref 0.0–0.5)
Eosinophils Relative: 1 %
HCT: 38.1 % (ref 36.0–46.0)
Hemoglobin: 12.3 g/dL (ref 12.0–15.0)
Immature Granulocytes: 0 %
Lymphocytes Relative: 20 %
Lymphs Abs: 1.2 K/uL (ref 0.7–4.0)
MCH: 33.7 pg (ref 26.0–34.0)
MCHC: 32.3 g/dL (ref 30.0–36.0)
MCV: 104.4 fL — ABNORMAL HIGH (ref 80.0–100.0)
Monocytes Absolute: 0.6 K/uL (ref 0.1–1.0)
Monocytes Relative: 10 %
Neutro Abs: 4.3 K/uL (ref 1.7–7.7)
Neutrophils Relative %: 69 %
Platelet Count: 172 K/uL (ref 150–400)
RBC: 3.65 MIL/uL — ABNORMAL LOW (ref 3.87–5.11)
RDW: 12.8 % (ref 11.5–15.5)
WBC Count: 6.3 K/uL (ref 4.0–10.5)
nRBC: 0 % (ref 0.0–0.2)

## 2024-01-10 LAB — CMP (CANCER CENTER ONLY)
ALT: 16 U/L (ref 0–44)
AST: 23 U/L (ref 15–41)
Albumin: 4.2 g/dL (ref 3.5–5.0)
Alkaline Phosphatase: 104 U/L (ref 38–126)
Anion gap: 3 — ABNORMAL LOW (ref 5–15)
BUN: 10 mg/dL (ref 8–23)
CO2: 39 mmol/L — ABNORMAL HIGH (ref 22–32)
Calcium: 9.3 mg/dL (ref 8.9–10.3)
Chloride: 98 mmol/L (ref 98–111)
Creatinine: 0.52 mg/dL (ref 0.44–1.00)
GFR, Estimated: >60 mL/min (ref >60)
Glucose, Bld: 86 mg/dL (ref 70–99)
Potassium: 4.9 mmol/L (ref 3.5–5.1)
Sodium: 140 mmol/L (ref 135–145)
Total Bilirubin: 0.4 mg/dL (ref 0.0–1.2)
Total Protein: 7.2 g/dL (ref 6.5–8.1)

## 2024-01-10 MED ORDER — IOHEXOL 300 MG/ML  SOLN
100.0000 mL | Freq: Once | INTRAMUSCULAR | Status: AC | PRN
  Administered 2024-01-10: 100 mL via INTRAVENOUS

## 2024-01-17 ENCOUNTER — Inpatient Hospital Stay (HOSPITAL_BASED_OUTPATIENT_CLINIC_OR_DEPARTMENT_OTHER): Admitting: Internal Medicine

## 2024-01-17 VITALS — BP 128/82 | HR 101 | Temp 97.3°F | Resp 17 | Ht 64.0 in | Wt 126.0 lb

## 2024-01-17 DIAGNOSIS — C3490 Malignant neoplasm of unspecified part of unspecified bronchus or lung: Secondary | ICD-10-CM

## 2024-01-17 DIAGNOSIS — C3411 Malignant neoplasm of upper lobe, right bronchus or lung: Secondary | ICD-10-CM | POA: Diagnosis not present

## 2024-01-17 NOTE — Progress Notes (Signed)
 [facility] [phone]   [phone]  OFFICE PROGRESS NOTE  Trudy Mliss Dragon, MD [address] [place], [address] [place] [address]  DIAGNOSIS:  Stage IV (T2b, N0, M1 a) non-small cell lung cancer with unclear subtype on the pathology report diagnosed in November 2022 and presented with right upper lobe lung mass as well as left upper lobe nodule in addition to right lower lobe nodule. In October 2024 she was found to have suspicious metachronous primary with RLL nodule and subcarinal node vs. Metastatic disease which was biopsied and consistent with Squamous cell carcinoma.    DETECTED ALTERATION(S) / BIOMARKER(S)     % CFDNA OR AMPLIFICATION        ASSOCIATED FDA-APPROVED THERAPIES         CLINICAL TRIAL AVAILABILITY [id number] 0.1% NoneNo   PD-L1 expression 1%   PRIOR THERAPY:  1) SBRT to the left and right lung nodules under the care of Dr. Shannon completed on June 24, 2021.  2) Concurrent chemoradiation with carboplatin  for an AUC of 2 paclitaxel  45 mg/m.  First dose expected on 05/02/2023 status post 6 cycles   CURRENT THERAPY: Observation.  INTERVAL HISTORY: Amy Sandoval 89 y.o. female returns to the clinic today for follow-up visit accompanied by a friend.Discussed the use of AI scribe software for clinical note transcription with the patient, who gave verbal consent to proceed.  History of Present Illness Amy Sandoval is an 89 year old female with stage four non-small cell lung cancer who presents for evaluation and discussion of recent chest CT scan results. She is accompanied by her daughter.  Diagnosed with stage four non-small cell lung cancer in November 2022, she experienced disease progression in October 2024. She underwent stereotactic body radiation therapy to the left and right lung nodules in February 2024, followed by a course of concurrent chemoradiation with weekly carboplatin  and paclitaxel , completed in March  2025. Since then, she has been under observation.  A recent CT scan of the chest was performed, and she is here to discuss the results. No new symptoms have emerged since her last visit three months ago. No increased fatigue, nausea, vomiting, or diarrhea. She mentions a weight loss from 140 pounds to 126 pounds, attributing this to being 'too lazy to cook' and notes feeling 'always hungry'.  She has chronic obstructive pulmonary disease and is on continuous oxygen therapy.     MEDICAL HISTORY: Past Medical History:  Diagnosis Date   Arthritis    COPD (chronic obstructive pulmonary disease) (HCC)    COPD with chronic hypoxic respiratory failure (HCC) 08/17/2022   First diagnosis 12/2020 upon admission for multifocal pneumonia   [facility]  PFTs 2022 confirmed obstruction and mild reduction in DLCO     History of radiation therapy    RUL lung and LLL lung 06/15/2021-06/24/2021  Dr Lynwood Shannon   History of radiation therapy    Right Lung-05/02/23-06/13/23   Hx of cervical spine fracture 2/2 fall managed with neck brace 08/20/2022   Hx of herpes zoster infection of R thoracic region; no neuropathic pain    Non-small cell lung cancer (NSCLC) (HCC) 12/2020   Pneumonia    Rhinorrhea, chronic right unilateral 02/17/2023   Sepsis (HCC) 12/18/2020    ALLERGIES:  is allergic to penicillins.  MEDICATIONS:  Current Outpatient Medications  Medication Sig Dispense Refill   ipratropium (ATROVENT ) 0.06 % nasal spray Place 2 sprays into both nostrils 2 (two) times daily as needed  for rhinitis. 15 mL 5   OXYGEN Inhale 3 L into the lungs as needed. When 02 sats drop below 92     No current facility-administered medications for this visit.    SURGICAL HISTORY:  Past Surgical History:  Procedure Laterality Date   BRONCHIAL BIOPSY  03/09/2021   Procedure: BRONCHIAL BIOPSIES;  Surgeon: Shelah Lamar RAMAN, MD;  Location: [facility] ENDOSCOPY;  Service: Pulmonary;;   BRONCHIAL BIOPSY  04/04/2023    Procedure: BRONCHIAL BIOPSIES;  Surgeon: Shelah Lamar RAMAN, MD;  Location: [facility] ENDOSCOPY;  Service: Pulmonary;;   BRONCHIAL BRUSHINGS  03/09/2021   Procedure: BRONCHIAL BRUSHINGS;  Surgeon: Shelah Lamar RAMAN, MD;  Location: [facility] ENDOSCOPY;  Service: Pulmonary;;   BRONCHIAL BRUSHINGS  04/04/2023   Procedure: BRONCHIAL BRUSHINGS;  Surgeon: Shelah Lamar RAMAN, MD;  Location: [facility] ENDOSCOPY;  Service: Pulmonary;;   BRONCHIAL NEEDLE ASPIRATION BIOPSY  03/09/2021   Procedure: BRONCHIAL NEEDLE ASPIRATION BIOPSIES;  Surgeon: Shelah Lamar RAMAN, MD;  Location: [facility] ENDOSCOPY;  Service: Pulmonary;;   BRONCHIAL NEEDLE ASPIRATION BIOPSY  04/04/2023   Procedure: BRONCHIAL NEEDLE ASPIRATION BIOPSIES;  Surgeon: Shelah Lamar RAMAN, MD;  Location: [facility] ENDOSCOPY;  Service: Pulmonary;;   DILATION AND CURETTAGE OF UTERUS     ENDOBRONCHIAL ULTRASOUND Right 04/04/2023   Procedure: ENDOBRONCHIAL ULTRASOUND WITH FLURO;  Surgeon: Shelah Lamar RAMAN, MD;  Location: [facility] ENDOSCOPY;  Service: Pulmonary;  Laterality: Right;   EYE SURGERY     bilateral cataract removal   FIDUCIAL MARKER PLACEMENT  04/04/2023   Procedure: FIDUCIAL MARKER PLACEMENT;  Surgeon: Shelah Lamar RAMAN, MD;  Location: [facility] ENDOSCOPY;  Service: Pulmonary;;   TONSILLECTOMY     VIDEO BRONCHOSCOPY WITH ENDOBRONCHIAL NAVIGATION Bilateral 03/09/2021   Procedure: VIDEO BRONCHOSCOPY WITH ENDOBRONCHIAL NAVIGATION;  Surgeon: Shelah Lamar RAMAN, MD;  Location: [facility] ENDOSCOPY;  Service: Pulmonary;  Laterality: Bilateral;  ION w/ possible fiducial    REVIEW OF SYSTEMS:  A comprehensive review of systems was negative except for: Constitutional: positive for fatigue Respiratory: positive for dyspnea on exertion   PHYSICAL EXAMINATION: General appearance: alert, cooperative, fatigued, and no distress Head: Normocephalic, without obvious abnormality, atraumatic Neck: no adenopathy, no JVD, supple, symmetrical, trachea midline, and thyroid not enlarged, symmetric, no tenderness/mass/nodules Lymph nodes:  Cervical, supraclavicular, and axillary nodes normal. Resp: clear to auscultation bilaterally Back: symmetric, no curvature. ROM normal. No CVA tenderness. Cardio: regular rate and rhythm, S1, S2 normal, no murmur, click, rub or gallop GI: soft, non-tender; bowel sounds normal; no masses,  no organomegaly Extremities: extremities normal, atraumatic, no cyanosis or edema  ECOG PERFORMANCE STATUS: 1 - Symptomatic but completely ambulatory  Pulse (!) 101, temperature (!) 97.3 F (36.3 C), resp. rate 17, height 5' 4 (1.626 m), weight 126 lb (57.2 kg), SpO2 92%.  LABORATORY DATA: Lab Results  Component Value Date   WBC 6.3 01/10/2024   HGB 12.3 01/10/2024   HCT 38.1 01/10/2024   MCV 104.4 (H) 01/10/2024   PLT 172 01/10/2024      Chemistry      Component Value Date/Time   NA 140 01/10/2024 1151   K 4.9 01/10/2024 1151   CL 98 01/10/2024 1151   CO2 39 (H) 01/10/2024 1151   BUN 10 01/10/2024 1151   CREATININE 0.52 01/10/2024 1151      Component Value Date/Time   CALCIUM 9.3 01/10/2024 1151   ALKPHOS 104 01/10/2024 1151   AST 23 01/10/2024 1151   ALT 16 01/10/2024 1151   BILITOT 0.4 01/10/2024 1151       RADIOGRAPHIC STUDIES:  CT Chest W Contrast Result Date: 01/10/2024 CLINICAL DATA:  Non-small cell lung cancer, staging. * Tracking Code: BO * EXAM: CT CHEST WITH CONTRAST TECHNIQUE: Multidetector CT imaging of the chest was performed during intravenous contrast administration. RADIATION DOSE REDUCTION: This exam was performed according to the departmental dose-optimization program which includes automated exposure control, adjustment of the mA and/or kV according to patient size and/or use of iterative reconstruction technique. CONTRAST:  OMNIPAQUE  IOHEXOL  300 MG/ML  SOLN COMPARISON:  10/12/2023. FINDINGS: Cardiovascular: Atherosclerotic calcification of the aorta, aortic valve and coronary arteries. Enlarged pulmonic trunk and heart. No pericardial effusion.  Mediastinum/Nodes: No pathologically enlarged mediastinal, hilar or axillary lymph nodes. Esophagus is grossly unremarkable. Lungs/Pleura: Biapical pleuroparenchymal scarring, right greater than left. Apical segment right upper lobe nodule measures 1.6 x 1.6 cm (7/22), stable. Centrilobular and paraseptal emphysema. Fiducial markers in scarring in the lateral right lower lobe. Spiculated nodule in the left lower lobe measures 9 x 12 mm (7/84) with surrounding architectural distortion, indicative of radiation therapy. Findings are unchanged from 10/12/2023. No pleural fluid. Airway is unremarkable. Upper Abdomen: Low-attenuation lesion in the left kidney. No specific follow-up necessary. Tiny hiatal hernia. Visualized portions of the liver, gallbladder, adrenal glands, kidneys, spleen, pancreas, stomach and bowel are otherwise grossly unremarkable. No upper abdominal adenopathy. Musculoskeletal: Degenerative changes in the spine. Old right rib fractures. IMPRESSION: 1. Stable right upper and left lower lobe nodules with changes of radiation therapy in the surrounding lung. No evidence of disease progression. 2. Aortic atherosclerosis (ICD10-I70.0). Coronary artery calcification. 3. Enlarged pulmonic trunk, indicative of pulmonary arterial hypertension. 4.  Emphysema (ICD10-J43.9). Electronically Signed   By: Newell Eke M.D.   On: 01/10/2024 14:59     ASSESSMENT AND PLAN: This is a very pleasant 89 years old white female with a stage IV (T2b, N0, M1 a) non-small cell lung cancer, squamous cell carcinoma diagnosed in November 2022 and presented with right upper lobe lung mass and adjacent nodule in addition to synchronous primary versus oligometastatic nodule in the left lower lobe diagnosed and November 2022. Molecular studies showed no actionable mutations and PD-L1 expression of 1%. The patient status post SBRT to the left and right lung nodule under the care of Dr. Shannon. The patient has been in  observation and she was feeling fine except for the baseline shortness of breath. She had evidence for disease recurrence with non-small cell lung cancer, squamous cell carcinoma in December 2024. The patient underwent a course of concurrent chemoradiation with weekly carboplatin  for AUC of 2 and paclitaxel  45 Mg/M2.  She is status post 7 cycles.  Last dose was given on June 13, 2023 She tolerated her treatment fairly well. She had repeat CT scan of the chest performed recently.  I personally and independently reviewed the scan and discussed the result with the patient today.  Her scan showed no concerning findings for disease progression. Assessment and Plan Assessment & Plan Stage 4 non-small cell lung cancer Stage 4 non-small cell lung cancer diagnosed in November 2022 with disease progression in October 2024. Underwent SPRT to lung nodules in February 2025 and completed concurrent chemoradiation with carboplatin  and paclitaxel  in March 2025. Currently on surveillance with no new concerning findings on recent chest CT scan. - Continue surveillance with repeat chest CT scan in four months - Schedule follow-up appointment in four months - Instruct to call if any new symptoms or concerns arise  Chronic obstructive pulmonary disease (COPD) Chronic obstructive pulmonary disease requiring continuous oxygen therapy.  No new symptoms reported. She was advised to call immediately if she has any other concerning symptoms in the interval.  The patient voices understanding of current disease status and treatment options and is in agreement with the current care plan.  All questions were answered. The patient knows to call the clinic with any problems, questions or concerns. We can certainly see the patient much sooner if necessary.   Disclaimer: This note was dictated with voice recognition software. Similar sounding words can inadvertently be transcribed and may not be corrected upon review.

## 2024-04-09 ENCOUNTER — Ambulatory Visit (INDEPENDENT_AMBULATORY_CARE_PROVIDER_SITE_OTHER): Payer: Medicare Other | Admitting: Otolaryngology

## 2024-05-08 ENCOUNTER — Inpatient Hospital Stay: Attending: Physician Assistant

## 2024-05-08 ENCOUNTER — Ambulatory Visit (HOSPITAL_COMMUNITY)
Admission: RE | Admit: 2024-05-08 | Discharge: 2024-05-08 | Disposition: A | Discharge status: Home / Self Care | Source: Ambulatory Visit | Attending: Internal Medicine | Admitting: Internal Medicine

## 2024-05-08 DIAGNOSIS — C3490 Malignant neoplasm of unspecified part of unspecified bronchus or lung: Secondary | ICD-10-CM | POA: Insufficient documentation

## 2024-05-08 LAB — CBC WITH DIFFERENTIAL (CANCER CENTER ONLY)
Abs Immature Granulocytes: 0.01 K/uL (ref 0.00–0.07)
Basophils Absolute: 0 K/uL (ref 0.0–0.1)
Basophils Relative: 0 %
Eosinophils Absolute: 0.1 K/uL (ref 0.0–0.5)
Eosinophils Relative: 1 %
HCT: 41.1 % (ref 36.0–46.0)
Hemoglobin: 12.9 g/dL (ref 12.0–15.0)
Immature Granulocytes: 0 %
Lymphocytes Relative: 20 %
Lymphs Abs: 1.3 K/uL (ref 0.7–4.0)
MCH: 33.2 pg (ref 26.0–34.0)
MCHC: 31.4 g/dL (ref 30.0–36.0)
MCV: 105.9 fL — ABNORMAL HIGH (ref 80.0–100.0)
Monocytes Absolute: 0.6 K/uL (ref 0.1–1.0)
Monocytes Relative: 10 %
Neutro Abs: 4.4 K/uL (ref 1.7–7.7)
Neutrophils Relative %: 69 %
Platelet Count: 175 K/uL (ref 150–400)
RBC: 3.88 MIL/uL (ref 3.87–5.11)
RDW: 12.9 % (ref 11.5–15.5)
WBC Count: 6.4 K/uL (ref 4.0–10.5)
nRBC: 0 % (ref 0.0–0.2)

## 2024-05-08 LAB — CMP (CANCER CENTER ONLY)
ALT: 16 U/L (ref 0–44)
AST: 30 U/L (ref 15–41)
Albumin: 4.2 g/dL (ref 3.5–5.0)
Alkaline Phosphatase: 118 U/L (ref 38–126)
Anion gap: 7 (ref 5–15)
BUN: 17 mg/dL (ref 8–23)
CO2: 34 mmol/L — ABNORMAL HIGH (ref 22–32)
Calcium: 9.6 mg/dL (ref 8.9–10.3)
Chloride: 101 mmol/L (ref 98–111)
Creatinine: 0.65 mg/dL (ref 0.44–1.00)
GFR, Estimated: >60 mL/min
Glucose, Bld: 114 mg/dL — ABNORMAL HIGH (ref 70–99)
Potassium: 5 mmol/L (ref 3.5–5.1)
Sodium: 142 mmol/L (ref 135–145)
Total Bilirubin: 0.3 mg/dL (ref 0.0–1.2)
Total Protein: 7.3 g/dL (ref 6.5–8.1)

## 2024-05-08 MED ORDER — IOHEXOL 300 MG/ML  SOLN
60.0000 mL | Freq: Once | INTRAMUSCULAR | Status: AC | PRN
  Administered 2024-05-08: 60 mL via INTRAVENOUS

## 2024-05-16 ENCOUNTER — Inpatient Hospital Stay (HOSPITAL_BASED_OUTPATIENT_CLINIC_OR_DEPARTMENT_OTHER): Admitting: Internal Medicine

## 2024-05-16 VITALS — BP 141/70 | HR 85 | Temp 97.6°F | Resp 17 | Ht 64.0 in | Wt 126.0 lb

## 2024-05-16 DIAGNOSIS — C3490 Malignant neoplasm of unspecified part of unspecified bronchus or lung: Secondary | ICD-10-CM

## 2024-05-16 NOTE — Progress Notes (Signed)
 "     [facility] [phone]   [phone]  OFFICE PROGRESS NOTE  Trudy Mliss Dragon, MD [address] [place], [address] [place] [address]  DIAGNOSIS:  Stage IV (T2b, N0, M1 a) non-small cell lung cancer with unclear subtype on the pathology report diagnosed in November 2022 and presented with right upper lobe lung mass as well as left upper lobe nodule in addition to right lower lobe nodule. In October 2024 she was found to have suspicious metachronous primary with RLL nodule and subcarinal node vs. Metastatic disease which was biopsied and consistent with Squamous cell carcinoma.    DETECTED ALTERATION(S) / BIOMARKER(S)     % CFDNA OR AMPLIFICATION        ASSOCIATED FDA-APPROVED THERAPIES         CLINICAL TRIAL AVAILABILITY [id number] 0.1% NoneNo   PD-L1 expression 1%   PRIOR THERAPY:  1) SBRT to the left and right lung nodules under the care of Dr. Shannon completed on June 24, 2021.  2) Concurrent chemoradiation with carboplatin  for an AUC of 2 paclitaxel  45 mg/m.  First dose expected on 05/02/2023 status post 6 cycles   CURRENT THERAPY: Observation.  INTERVAL HISTORY: Leenah Seidner 89 y.o. female returns to the clinic today for follow-up visit accompanied by granddaughter Hospital Doctor.Discussed the use of AI scribe software for clinical note transcription with the patient, who gave verbal consent to proceed.  History of Present Illness Tyjanae Bartek is an 89 year old female with stage IV non-small cell lung cancer who presents for follow-up and restaging imaging.  She was diagnosed with stage IV non-small cell lung cancer in November 2022 and initially treated with stereotactic body radiation therapy. Disease recurrence was identified in October 2024, for which she completed chemoradiation with weekly carboplatin  and paclitaxel  in December 2024.  She remains on 3 liters of supplemental oxygen for chronic hypoxemic respiratory failure, with no  increase in oxygen requirement over the past four months. She denies new symptoms, including chest pain, cough, hemoptysis, weight loss, night sweats, nausea, or vomiting. No new or worsening respiratory complaints were reported.    MEDICAL HISTORY: Past Medical History:  Diagnosis Date   Arthritis    COPD (chronic obstructive pulmonary disease) (HCC)    COPD with chronic hypoxic respiratory failure (HCC) 08/17/2022   First diagnosis 12/2020 upon admission for multifocal pneumonia   [place] [facility]  PFTs 2022 confirmed obstruction and mild reduction in DLCO     History of radiation therapy    RUL lung and LLL lung 06/15/2021-06/24/2021  Dr Lynwood Shannon   History of radiation therapy    Right Lung-05/02/23-06/13/23   Hx of cervical spine fracture 2/2 fall managed with neck brace 08/20/2022   Hx of herpes zoster infection of R thoracic region; no neuropathic pain    Non-small cell lung cancer (NSCLC) (HCC) 12/2020   Pneumonia    Rhinorrhea, chronic right unilateral 02/17/2023   Sepsis (HCC) 12/18/2020    ALLERGIES:  is allergic to penicillins.  MEDICATIONS:  Current Outpatient Medications  Medication Sig Dispense Refill   ipratropium (ATROVENT ) 0.06 % nasal spray Place 2 sprays into both nostrils 2 (two) times daily as needed for rhinitis. 15 mL 5   OXYGEN Inhale 3 L into the lungs as needed. When 02 sats drop below 92     No current facility-administered medications for this visit.    SURGICAL HISTORY:  Past Surgical History:  Procedure Laterality Date   BRONCHIAL BIOPSY  03/09/2021  Procedure: BRONCHIAL BIOPSIES;  Surgeon: Shelah Lamar RAMAN, MD;  Location: [facility] ENDOSCOPY;  Service: Pulmonary;;   BRONCHIAL BIOPSY  04/04/2023   Procedure: BRONCHIAL BIOPSIES;  Surgeon: Shelah Lamar RAMAN, MD;  Location: [facility] ENDOSCOPY;  Service: Pulmonary;;   BRONCHIAL BRUSHINGS  03/09/2021   Procedure: BRONCHIAL BRUSHINGS;  Surgeon: Shelah Lamar RAMAN, MD;  Location: [facility] ENDOSCOPY;  Service: Pulmonary;;    BRONCHIAL BRUSHINGS  04/04/2023   Procedure: BRONCHIAL BRUSHINGS;  Surgeon: Shelah Lamar RAMAN, MD;  Location: [facility] ENDOSCOPY;  Service: Pulmonary;;   BRONCHIAL NEEDLE ASPIRATION BIOPSY  03/09/2021   Procedure: BRONCHIAL NEEDLE ASPIRATION BIOPSIES;  Surgeon: Shelah Lamar RAMAN, MD;  Location: [facility] ENDOSCOPY;  Service: Pulmonary;;   BRONCHIAL NEEDLE ASPIRATION BIOPSY  04/04/2023   Procedure: BRONCHIAL NEEDLE ASPIRATION BIOPSIES;  Surgeon: Shelah Lamar RAMAN, MD;  Location: [facility] ENDOSCOPY;  Service: Pulmonary;;   DILATION AND CURETTAGE OF UTERUS     ENDOBRONCHIAL ULTRASOUND Right 04/04/2023   Procedure: ENDOBRONCHIAL ULTRASOUND WITH FLURO;  Surgeon: Shelah Lamar RAMAN, MD;  Location: [facility] ENDOSCOPY;  Service: Pulmonary;  Laterality: Right;   EYE SURGERY     bilateral cataract removal   FIDUCIAL MARKER PLACEMENT  04/04/2023   Procedure: FIDUCIAL MARKER PLACEMENT;  Surgeon: Shelah Lamar RAMAN, MD;  Location: [facility] ENDOSCOPY;  Service: Pulmonary;;   TONSILLECTOMY     VIDEO BRONCHOSCOPY WITH ENDOBRONCHIAL NAVIGATION Bilateral 03/09/2021   Procedure: VIDEO BRONCHOSCOPY WITH ENDOBRONCHIAL NAVIGATION;  Surgeon: Shelah Lamar RAMAN, MD;  Location: [facility] ENDOSCOPY;  Service: Pulmonary;  Laterality: Bilateral;  ION w/ possible fiducial    REVIEW OF SYSTEMS:  A comprehensive review of systems was negative except for: Constitutional: positive for fatigue Respiratory: positive for dyspnea on exertion   PHYSICAL EXAMINATION: General appearance: alert, cooperative, fatigued, and no distress Head: Normocephalic, without obvious abnormality, atraumatic Neck: no adenopathy, no JVD, supple, symmetrical, trachea midline, and thyroid not enlarged, symmetric, no tenderness/mass/nodules Lymph nodes: Cervical, supraclavicular, and axillary nodes normal. Resp: wheezes bilaterally Back: symmetric, no curvature. ROM normal. No CVA tenderness. Cardio: regular rate and rhythm, S1, S2 normal, no murmur, click, rub or gallop GI: soft, non-tender; bowel sounds  normal; no masses,  no organomegaly Extremities: extremities normal, atraumatic, no cyanosis or edema  ECOG PERFORMANCE STATUS: 1 - Symptomatic but completely ambulatory  Blood pressure (!) 141/70, pulse 85, temperature 97.6 F (36.4 C), temperature source Temporal, resp. rate 17, height 5' 4 (1.626 m), weight 126 lb (57.2 kg), SpO2 97%.  LABORATORY DATA: Lab Results  Component Value Date   WBC 6.4 05/08/2024   HGB 12.9 05/08/2024   HCT 41.1 05/08/2024   MCV 105.9 (H) 05/08/2024   PLT 175 05/08/2024      Chemistry      Component Value Date/Time   NA 142 05/08/2024 1538   K 5.0 05/08/2024 1538   CL 101 05/08/2024 1538   CO2 34 (H) 05/08/2024 1538   BUN 17 05/08/2024 1538   CREATININE 0.65 05/08/2024 1538      Component Value Date/Time   CALCIUM 9.6 05/08/2024 1538   ALKPHOS 118 05/08/2024 1538   AST 30 05/08/2024 1538   ALT 16 05/08/2024 1538   BILITOT 0.3 05/08/2024 1538       RADIOGRAPHIC STUDIES: CT Chest W Contrast Result Date: 05/08/2024 CLINICAL DATA:  Restaging non-small cell lung cancer. * Tracking Code: BO * EXAM: CT CHEST WITH CONTRAST TECHNIQUE: Multidetector CT imaging of the chest was performed during intravenous contrast administration. RADIATION DOSE REDUCTION: This exam was performed according to the departmental dose-optimization program  which includes automated exposure control, adjustment of the mA and/or kV according to patient size and/or use of iterative reconstruction technique. CONTRAST:  60mL OMNIPAQUE  IOHEXOL  300 MG/ML  SOLN COMPARISON:  Multiple prior imaging studies. The most recent chest CT is 01/10/2024 FINDINGS: Cardiovascular: The heart is normal in size. No pericardial effusion. The aorta is normal in caliber. Stable atherosclerotic calcifications. No dissection. The branch vessels are patent. Stable coronary artery calcifications. Stable mild enlargement of the pulmonary arteries which may suggest pulmonary hypertension. Mediastinum/Nodes: No  mediastinal or hilar mass or lymphadenopathy. The esophagus is grossly normal. Lungs/Pleura: Stable extensive right apical scarring changes. The central apical nodular lesion measures 17 x 13 mm on image 20/6. This is stable when compared to the prior CT scan. No new nodularity. Stable left lower lobe pulmonary lesion with surrounding radiation changes. This measures 14 x 8 mm and previously measured 13 x 9 mm. The nodular lesion in the right lower lobe with nearby fiducials appears to be slightly larger when compared to the prior 2 CT scans. It measures 12 x 8 mm on image number 123/6 and previously measured approximately 8 x 7 mm. It may be contracting radiation scar tissue but recommend close surveillance. There is also a small nodular lesion more superiorly and laterally which measures 6 mm on image 118/6. This was not present on the prior study but again could be radiation related. No acute pulmonary findings. Stable underlying emphysematous changes and pulmonary scarring. Upper Abdomen: No significant upper abdominal findings. Stable left renal cyst. Stable vascular disease. No upper abdominal adenopathy. Musculoskeletal: No significant bony findings. IMPRESSION: 1. Stable left lower lobe pulmonary lesion with surrounding radiation changes. 2. The nodular lesion in the right lower lobe with nearby fiducials appears to be slightly larger when compared to the prior 2 CT scans. It measures 12 x 8 mm and previously measured approximately 8 x 7 mm. It may be contracting radiation scar tissue but recommend close surveillance (follow-up noncontrast chest CT in 3 months). 3. There is also a small nodular lesion more superiorly and laterally which measures 6 mm. This was not present on the prior study but again could be radiation related. 4. Stable extensive right apical scarring changes. The nodular apical lesion is stable. 5. No mediastinal or hilar mass or adenopathy. 6. Stable emphysematous changes and pulmonary  scarring. 7. Stable atherosclerotic calcifications involving the thoracic and abdominal aorta and branch vessels including the coronary arteries. 8. Stable mild enlargement of the pulmonary arteries which may suggest pulmonary hypertension. 9. Aortic atherosclerosis. Aortic Atherosclerosis (ICD10-I70.0) and Emphysema (ICD10-J43.9). Electronically Signed   By: MYRTIS Stammer M.D.   On: 05/08/2024 21:36     ASSESSMENT AND PLAN: This is a very pleasant 89 years old white female with a stage IV (T2b, N0, M1 a) non-small cell lung cancer, squamous cell carcinoma diagnosed in November 2022 and presented with right upper lobe lung mass and adjacent nodule in addition to synchronous primary versus oligometastatic nodule in the left lower lobe diagnosed and November 2022. Molecular studies showed no actionable mutations and PD-L1 expression of 1%. The patient status post SBRT to the left and right lung nodule under the care of Dr. Shannon. The patient has been in observation and she was feeling fine except for the baseline shortness of breath. She had evidence for disease recurrence with non-small cell lung cancer, squamous cell carcinoma in December 2024. The patient underwent a course of concurrent chemoradiation with weekly carboplatin  for AUC of 2 and  paclitaxel  45 Mg/M2.  She is status post 7 cycles.  Last dose was given on June 13, 2023 She tolerated her treatment fairly well. She had repeat CT scan of the chest performed recently.  I personally independently reviewed the scan and discussed the results with the patient and her granddaughter today.  Her scan showed no concerning findings for disease progression but there was a nodular lesion in the right lower lobe that appeared to be slightly larger compared to the previous 2 scans and may be contracting radiation to scar tissue but close follow-up was recommended. Assessment and Plan Assessment & Plan Stage IV non-small cell lung cancer She has  advanced non-small cell lung cancer with prior stereotactic body radiotherapy and chemoradiation for recurrence. Current chest CT demonstrates stable disease with a slight increase in size of one area within a previously radiated region, most consistent with post-radiation change or scarring rather than progression. She remains asymptomatic without new complaints. - Reviewed recent chest CT showing stable disease with a slight increase in size in a previously radiated region, likely post-radiation change. - Recommended continued observation without immediate intervention. - Scheduled follow-up in four months.  Chronic respiratory failure with hypoxemia She has chronic respiratory failure with hypoxemia, requiring supplemental oxygen at 3 L/min. Mild wheezing persists on examination, but her respiratory status is unchanged. - Assessed oxygen requirement, which remains at 3 L/min. - Reinforced ongoing use of supplemental oxygen. She was advised to call immediately if she has any other concerning symptoms in the interval.  The patient voices understanding of current disease status and treatment options and is in agreement with the current care plan.  All questions were answered. The patient knows to call the clinic with any problems, questions or concerns. We can certainly see the patient much sooner if necessary.   Disclaimer: This note was dictated with voice recognition software. Similar sounding words can inadvertently be transcribed and may not be corrected upon review.         "

## 2024-09-05 ENCOUNTER — Inpatient Hospital Stay

## 2024-09-12 ENCOUNTER — Inpatient Hospital Stay: Admitting: Internal Medicine

## 2024-10-17 ENCOUNTER — Ambulatory Visit
